# Patient Record
Sex: Female | Born: 1935
Health system: Southern US, Community
[De-identification: ages and names within clinical notes are randomized; demographics above are authoritative.]

## PROBLEM LIST (undated history)

## (undated) DIAGNOSIS — Z780 Asymptomatic menopausal state: Secondary | ICD-10-CM

## (undated) DIAGNOSIS — J8410 Pulmonary fibrosis, unspecified: Secondary | ICD-10-CM

## (undated) DIAGNOSIS — E079 Disorder of thyroid, unspecified: Secondary | ICD-10-CM

## (undated) DIAGNOSIS — D369 Benign neoplasm, unspecified site: Secondary | ICD-10-CM

## (undated) DIAGNOSIS — E039 Hypothyroidism, unspecified: Secondary | ICD-10-CM

## (undated) DIAGNOSIS — E7800 Pure hypercholesterolemia, unspecified: Secondary | ICD-10-CM

## (undated) DIAGNOSIS — K589 Irritable bowel syndrome without diarrhea: Secondary | ICD-10-CM

## (undated) DIAGNOSIS — M1990 Unspecified osteoarthritis, unspecified site: Secondary | ICD-10-CM

## (undated) HISTORY — DX: Unspecified osteoarthritis, unspecified site: M19.90

## (undated) HISTORY — DX: Pure hypercholesterolemia, unspecified: E78.00

## (undated) HISTORY — PX: COLONOSCOPY: SHX174

## (undated) HISTORY — PX: TONSILLECTOMY: SUR1361

## (undated) HISTORY — DX: Asymptomatic menopausal state: Z78.0

## (undated) HISTORY — DX: Irritable bowel syndrome, unspecified: K58.9

## (undated) HISTORY — PX: OTHER SURGICAL HISTORY: SHX169

## (undated) HISTORY — DX: Disorder of thyroid, unspecified: E07.9

## (undated) HISTORY — DX: Benign neoplasm, unspecified site: D36.9

---

## 1998-10-28 ENCOUNTER — Other Ambulatory Visit: Admission: RE | Admit: 1998-10-28 | Discharge: 1998-10-28 | Payer: Self-pay | Admitting: Family Medicine

## 1999-11-23 ENCOUNTER — Other Ambulatory Visit: Admission: RE | Admit: 1999-11-23 | Discharge: 1999-11-23 | Payer: Self-pay | Admitting: *Deleted

## 2000-11-23 ENCOUNTER — Other Ambulatory Visit: Admission: RE | Admit: 2000-11-23 | Discharge: 2000-11-23 | Payer: Self-pay | Admitting: Family Medicine

## 2003-12-10 ENCOUNTER — Other Ambulatory Visit: Admission: RE | Admit: 2003-12-10 | Discharge: 2003-12-10 | Payer: Self-pay | Admitting: Family Medicine

## 2004-12-17 ENCOUNTER — Ambulatory Visit: Payer: Self-pay | Admitting: Family Medicine

## 2005-05-18 ENCOUNTER — Ambulatory Visit: Payer: Self-pay | Admitting: Family Medicine

## 2005-06-03 ENCOUNTER — Ambulatory Visit: Payer: Self-pay | Admitting: Family Medicine

## 2005-12-21 ENCOUNTER — Encounter: Payer: Self-pay | Admitting: Family Medicine

## 2005-12-21 ENCOUNTER — Ambulatory Visit: Payer: Self-pay | Admitting: Family Medicine

## 2005-12-21 ENCOUNTER — Other Ambulatory Visit: Admission: RE | Admit: 2005-12-21 | Discharge: 2005-12-21 | Payer: Self-pay | Admitting: Family Medicine

## 2006-12-22 ENCOUNTER — Encounter: Payer: Self-pay | Admitting: Family Medicine

## 2006-12-22 ENCOUNTER — Ambulatory Visit: Payer: Self-pay | Admitting: Family Medicine

## 2006-12-22 LAB — CONVERTED CEMR LAB
ALT: 19 units/L (ref 0–35)
Basophils Relative: 1.5 % — ABNORMAL HIGH (ref 0.0–1.0)
Bilirubin, Direct: 0.1 mg/dL (ref 0.0–0.3)
CO2: 28 meq/L (ref 19–32)
Calcium: 9.2 mg/dL (ref 8.4–10.5)
Direct LDL: 139.6 mg/dL
Eosinophils Absolute: 0.1 10*3/uL (ref 0.0–0.6)
Eosinophils Relative: 1.9 % (ref 0.0–5.0)
GFR calc Af Amer: 79 mL/min
Glucose, Bld: 92 mg/dL (ref 70–99)
HCT: 38.1 % (ref 36.0–46.0)
Hemoglobin: 13.2 g/dL (ref 12.0–15.0)
Lymphocytes Relative: 29.3 % (ref 12.0–46.0)
MCV: 96 fL (ref 78.0–100.0)
Monocytes Absolute: 0.5 10*3/uL (ref 0.2–0.7)
Neutro Abs: 4.2 10*3/uL (ref 1.4–7.7)
Neutrophils Relative %: 59.5 % (ref 43.0–77.0)
Platelets: 289 10*3/uL (ref 150–400)
Sodium: 141 meq/L (ref 135–145)
Total Protein: 7.1 g/dL (ref 6.0–8.3)
VLDL: 43 mg/dL — ABNORMAL HIGH (ref 0–40)
WBC: 7 10*3/uL (ref 4.5–10.5)

## 2006-12-23 ENCOUNTER — Encounter: Payer: Self-pay | Admitting: Family Medicine

## 2006-12-23 LAB — CONVERTED CEMR LAB: Vit D, 1,25-Dihydroxy: 51 (ref 20–57)

## 2007-01-02 ENCOUNTER — Ambulatory Visit: Payer: Self-pay | Admitting: Internal Medicine

## 2007-01-06 ENCOUNTER — Ambulatory Visit: Payer: Self-pay | Admitting: Family Medicine

## 2007-01-13 ENCOUNTER — Encounter: Payer: Self-pay | Admitting: Family Medicine

## 2007-01-13 ENCOUNTER — Ambulatory Visit: Payer: Self-pay | Admitting: Internal Medicine

## 2007-01-13 ENCOUNTER — Encounter: Payer: Self-pay | Admitting: Internal Medicine

## 2007-01-13 DIAGNOSIS — D369 Benign neoplasm, unspecified site: Secondary | ICD-10-CM

## 2007-01-13 HISTORY — DX: Benign neoplasm, unspecified site: D36.9

## 2007-03-17 ENCOUNTER — Ambulatory Visit: Payer: Self-pay | Admitting: Family Medicine

## 2007-08-08 ENCOUNTER — Encounter: Payer: Self-pay | Admitting: Family Medicine

## 2007-09-26 ENCOUNTER — Encounter: Payer: Self-pay | Admitting: Family Medicine

## 2007-12-26 ENCOUNTER — Other Ambulatory Visit: Admission: RE | Admit: 2007-12-26 | Discharge: 2007-12-26 | Payer: Self-pay | Admitting: Family Medicine

## 2007-12-26 ENCOUNTER — Ambulatory Visit: Payer: Self-pay | Admitting: Family Medicine

## 2007-12-26 ENCOUNTER — Encounter: Payer: Self-pay | Admitting: Family Medicine

## 2007-12-26 DIAGNOSIS — M129 Arthropathy, unspecified: Secondary | ICD-10-CM | POA: Insufficient documentation

## 2007-12-26 DIAGNOSIS — E039 Hypothyroidism, unspecified: Secondary | ICD-10-CM

## 2007-12-26 DIAGNOSIS — D649 Anemia, unspecified: Secondary | ICD-10-CM

## 2007-12-26 DIAGNOSIS — T50995A Adverse effect of other drugs, medicaments and biological substances, initial encounter: Secondary | ICD-10-CM

## 2007-12-26 DIAGNOSIS — M949 Disorder of cartilage, unspecified: Secondary | ICD-10-CM

## 2007-12-26 DIAGNOSIS — M899 Disorder of bone, unspecified: Secondary | ICD-10-CM | POA: Insufficient documentation

## 2007-12-26 DIAGNOSIS — J309 Allergic rhinitis, unspecified: Secondary | ICD-10-CM | POA: Insufficient documentation

## 2007-12-26 DIAGNOSIS — E785 Hyperlipidemia, unspecified: Secondary | ICD-10-CM | POA: Insufficient documentation

## 2007-12-26 LAB — CONVERTED CEMR LAB
Blood in Urine, dipstick: NEGATIVE
Nitrite: NEGATIVE
Protein, U semiquant: NEGATIVE
Specific Gravity, Urine: 1.015
WBC Urine, dipstick: NEGATIVE

## 2008-01-01 LAB — CONVERTED CEMR LAB
ALT: 18 units/L (ref 0–35)
AST: 21 units/L (ref 0–37)
Albumin: 4 g/dL (ref 3.5–5.2)
BUN: 13 mg/dL (ref 6–23)
Basophils Relative: 1.2 % (ref 0.0–3.0)
CO2: 28 meq/L (ref 19–32)
Chloride: 99 meq/L (ref 96–112)
Creatinine, Ser: 0.9 mg/dL (ref 0.4–1.2)
Direct LDL: 129.6 mg/dL
Eosinophils Relative: 2.4 % (ref 0.0–5.0)
Glucose, Bld: 107 mg/dL — ABNORMAL HIGH (ref 70–99)
HDL: 43.7 mg/dL (ref >39.0)
Lymphocytes Relative: 25.5 % (ref 12.0–46.0)
Monocytes Relative: 7.5 % (ref 3.0–12.0)
Neutrophils Relative %: 63.4 % (ref 43.0–77.0)
RBC: 3.99 M/uL (ref 3.87–5.11)
Total Bilirubin: 1 mg/dL (ref 0.3–1.2)
VLDL: 46 mg/dL — ABNORMAL HIGH (ref 0–40)
WBC: 6.4 10*3/uL (ref 4.5–10.5)

## 2008-01-03 ENCOUNTER — Ambulatory Visit: Payer: Self-pay | Admitting: Family Medicine

## 2008-01-03 LAB — CONVERTED CEMR LAB: OCCULT 2: NEGATIVE

## 2008-09-04 ENCOUNTER — Encounter: Payer: Self-pay | Admitting: Family Medicine

## 2008-12-26 ENCOUNTER — Ambulatory Visit: Payer: Self-pay | Admitting: Family Medicine

## 2008-12-30 ENCOUNTER — Encounter: Payer: Self-pay | Admitting: Family Medicine

## 2009-01-03 ENCOUNTER — Ambulatory Visit: Payer: Self-pay | Admitting: Family Medicine

## 2009-01-03 LAB — CONVERTED CEMR LAB
OCCULT 2: NEGATIVE
OCCULT 3: NEGATIVE

## 2009-01-07 ENCOUNTER — Encounter: Payer: Self-pay | Admitting: Family Medicine

## 2009-01-21 ENCOUNTER — Telehealth: Payer: Self-pay | Admitting: Family Medicine

## 2009-01-21 ENCOUNTER — Encounter: Payer: Self-pay | Admitting: Family Medicine

## 2009-08-01 ENCOUNTER — Ambulatory Visit: Payer: Self-pay | Admitting: Family Medicine

## 2009-08-07 ENCOUNTER — Ambulatory Visit: Payer: Self-pay | Admitting: Family Medicine

## 2009-08-07 DIAGNOSIS — K589 Irritable bowel syndrome without diarrhea: Secondary | ICD-10-CM

## 2009-08-08 LAB — CONVERTED CEMR LAB
Basophils Absolute: 0 10*3/uL (ref 0.0–0.1)
CO2: 28 meq/L (ref 19–32)
Eosinophils Absolute: 0.2 10*3/uL (ref 0.0–0.7)
HCT: 35.7 % — ABNORMAL LOW (ref 36.0–46.0)
Lymphocytes Relative: 20.7 % (ref 12.0–46.0)
Lymphs Abs: 1.3 10*3/uL (ref 0.7–4.0)
MCHC: 33.6 g/dL (ref 30.0–36.0)
Monocytes Relative: 11.5 % (ref 3.0–12.0)
Platelets: 313 10*3/uL (ref 150.0–400.0)
RDW: 12.1 % (ref 11.5–14.6)
Sodium: 138 meq/L (ref 135–145)

## 2009-08-19 ENCOUNTER — Ambulatory Visit: Payer: Self-pay | Admitting: Family Medicine

## 2009-08-22 ENCOUNTER — Telehealth: Payer: Self-pay | Admitting: Internal Medicine

## 2009-08-28 ENCOUNTER — Ambulatory Visit: Payer: Self-pay | Admitting: Family Medicine

## 2009-08-28 LAB — CONVERTED CEMR LAB: OCCULT 1: NEGATIVE

## 2009-09-04 ENCOUNTER — Encounter: Payer: Self-pay | Admitting: Family Medicine

## 2009-09-05 ENCOUNTER — Encounter: Payer: Self-pay | Admitting: Family Medicine

## 2009-09-09 ENCOUNTER — Ambulatory Visit: Payer: Self-pay | Admitting: Internal Medicine

## 2009-10-09 LAB — CONVERTED CEMR LAB
Basophils Absolute: 0.1 10*3/uL (ref 0.0–0.1)
Basophils Relative: 0.9 % (ref 0.0–3.0)
Eosinophils Relative: 6.2 % — ABNORMAL HIGH (ref 0.0–5.0)
HCT: 33.8 % — ABNORMAL LOW (ref 36.0–46.0)
Hemoglobin: 11.8 g/dL — ABNORMAL LOW (ref 12.0–15.0)
Lymphocytes Relative: 31.6 % (ref 12.0–46.0)
Lymphs Abs: 2.5 10*3/uL (ref 0.7–4.0)
Monocytes Relative: 8.7 % (ref 3.0–12.0)
Neutro Abs: 4.2 10*3/uL (ref 1.4–7.7)
RBC: 3.56 M/uL — ABNORMAL LOW (ref 3.87–5.11)
RDW: 13.6 % (ref 11.5–14.6)

## 2009-10-16 ENCOUNTER — Ambulatory Visit: Payer: Self-pay | Admitting: Internal Medicine

## 2009-10-16 LAB — CONVERTED CEMR LAB
Basophils Absolute: 0.1 10*3/uL (ref 0.0–0.1)
Eosinophils Absolute: 0.2 10*3/uL (ref 0.0–0.7)
HCT: 35.3 % — ABNORMAL LOW (ref 36.0–46.0)
Hemoglobin: 12.3 g/dL (ref 12.0–15.0)
Lymphs Abs: 1.9 10*3/uL (ref 0.7–4.0)
MCHC: 34.8 g/dL (ref 30.0–36.0)
MCV: 95.1 fL (ref 78.0–100.0)
Monocytes Absolute: 0.5 10*3/uL (ref 0.1–1.0)
Neutro Abs: 2.8 10*3/uL (ref 1.4–7.7)
Platelets: 245 10*3/uL (ref 150.0–400.0)
RDW: 13.8 % (ref 11.5–14.6)

## 2010-01-07 ENCOUNTER — Ambulatory Visit: Payer: Self-pay | Admitting: Family Medicine

## 2010-01-07 ENCOUNTER — Other Ambulatory Visit: Admission: RE | Admit: 2010-01-07 | Discharge: 2010-01-07 | Payer: Self-pay | Admitting: Family Medicine

## 2010-01-07 DIAGNOSIS — D179 Benign lipomatous neoplasm, unspecified: Secondary | ICD-10-CM | POA: Insufficient documentation

## 2010-01-07 DIAGNOSIS — M1710 Unilateral primary osteoarthritis, unspecified knee: Secondary | ICD-10-CM | POA: Insufficient documentation

## 2010-01-07 DIAGNOSIS — E559 Vitamin D deficiency, unspecified: Secondary | ICD-10-CM | POA: Insufficient documentation

## 2010-01-07 DIAGNOSIS — IMO0002 Reserved for concepts with insufficient information to code with codable children: Secondary | ICD-10-CM

## 2010-01-14 ENCOUNTER — Ambulatory Visit: Payer: Self-pay | Admitting: Family Medicine

## 2010-01-20 ENCOUNTER — Encounter: Payer: Self-pay | Admitting: Family Medicine

## 2010-01-29 ENCOUNTER — Encounter: Payer: Self-pay | Admitting: Family Medicine

## 2010-06-07 HISTORY — PX: CATARACT EXTRACTION, BILATERAL: SHX1313

## 2010-07-05 LAB — CONVERTED CEMR LAB
ALT: 11 units/L (ref 0–35)
AST: 14 units/L (ref 0–37)
Albumin: 3.7 g/dL (ref 3.5–5.2)
BUN: 15 mg/dL (ref 6–23)
BUN: 16 mg/dL (ref 6–23)
Basophils Absolute: 0 10*3/uL (ref 0.0–0.1)
Bilirubin Urine: NEGATIVE
Bilirubin, Direct: 0.2 mg/dL (ref 0.0–0.3)
Blood in Urine, dipstick: NEGATIVE
CO2: 28 meq/L (ref 19–32)
Chloride: 100 meq/L (ref 96–112)
Chloride: 102 meq/L (ref 96–112)
Cholesterol: 220 mg/dL — ABNORMAL HIGH (ref 0–200)
Creatinine, Ser: 0.8 mg/dL (ref 0.4–1.2)
Direct LDL: 145.9 mg/dL
Direct LDL: 150 mg/dL
Eosinophils Absolute: 0.1 10*3/uL (ref 0.0–0.7)
Eosinophils Relative: 2.4 % (ref 0.0–5.0)
GFR calc non Af Amer: 70.95 mL/min (ref >60)
Glucose, Bld: 90 mg/dL (ref 70–99)
Glucose, Urine, Semiquant: NEGATIVE
HCT: 38.4 % (ref 36.0–46.0)
HDL: 44.4 mg/dL (ref >39.00)
Hemoglobin: 13.2 g/dL (ref 12.0–15.0)
Ketones, urine, test strip: NEGATIVE
Lymphs Abs: 1.5 10*3/uL (ref 0.7–4.0)
MCHC: 34.9 g/dL (ref 30.0–36.0)
MCV: 96.1 fL (ref 78.0–100.0)
MCV: 97.3 fL (ref 78.0–100.0)
Monocytes Absolute: 0.4 10*3/uL (ref 0.1–1.0)
Monocytes Absolute: 0.5 10*3/uL (ref 0.1–1.0)
Monocytes Relative: 8 % (ref 3.0–12.0)
Neutro Abs: 3.8 10*3/uL (ref 1.4–7.7)
Neutrophils Relative %: 55.1 % (ref 43.0–77.0)
Nitrite: NEGATIVE
Pap Smear: NORMAL
Platelets: 216 10*3/uL (ref 150.0–400.0)
Potassium: 4.2 meq/L (ref 3.5–5.1)
RDW: 12.6 % (ref 11.5–14.6)
Sodium: 138 meq/L (ref 135–145)
Specific Gravity, Urine: 1.02
TSH: 3.46 microintl units/mL (ref 0.35–5.50)
Total Bilirubin: 1.4 mg/dL — ABNORMAL HIGH (ref 0.3–1.2)
Total Protein: 7 g/dL (ref 6.0–8.3)
Urobilinogen, UA: 0.2
VLDL: 22 mg/dL (ref 0.0–40.0)
VLDL: 39 mg/dL (ref 0.0–40.0)
Vit D, 25-Hydroxy: 49 ng/mL (ref 30–89)
WBC Urine, dipstick: NEGATIVE
WBC: 5.1 10*3/uL (ref 4.5–10.5)
WBC: 6 10*3/uL (ref 4.5–10.5)
pH: 7

## 2010-07-07 NOTE — Assessment & Plan Note (Signed)
Summary: IBS, gastritis, dark stools/sheri    History of Present Illness Visit Type: consult  Primary GI MD: Stan Head MD [facility] Primary Provider: Judithann Sheen, MD  Requesting Provider: Judithann Sheen, MD  Chief Complaint: irritable bowel syndrome History of Present Illness:   77 yo woman with a diagnosis of IBS. Known to me from colonoscopy in 2008.  She was diagnosed with an infectious gastroenteritis and treated by Drs Clent Ridges and Scotty Court. She was quite unwell. Stools were very black in March, for about 4 days. She was also heme + on DRE 08/19/09.  She had not taken Pepto-Bismol when stools were black. She took Librarian, academic for 4 weeks also. She is also using hyoscyamne. She is much better with no melena or rectal bleeding and stools forming up. Not much if any abdominal pain at this time. All other GI ROS negative.             Current Medications (verified): 1)  Aspirin 81 Mg  Tabs (Aspirin) .... One Tablet By Mouth Once Daily 2)  Fish Oil 1000 Mg  Caps (Omega-3 Fatty Acids) .... Once Daily 3)  Prempro 0.625-2.5 Mg  Tabs (Conj Estrog-Medroxyprogest Ace) .Marland Kitchen.. 1 Qd 4)  Bone Density 300-200 Mg-Unit Tabs (Calcium-Vit D-Arg-Inos-Silicon) .... One Tablet By Mouth Once Daily 5)  Vitamin D 1000 Unit  Tabs (Cholecalciferol) .... Once Daily 6)  Vesicare 5 Mg Tabs (Solifenacin Succinate) .Marland Kitchen.. 1 By Mouth Once Daily 7)  Hyoscyamine Sulfate Cr 0.375 Mg Xr12h-Tab (Hyoscyamine Sulfate) .Marland Kitchen.. 1 Stat Then 1 Am and Pm For Irritable Bowel and Coltis 8)  Vitamin C 500 Mg  Tabs (Ascorbic Acid) .... One Tablet By Mouth Once Daily  Allergies (verified): No Known Drug Allergies  Past History:  Past Medical History: Hyperthyroidism MENOPAUSE 1965 IBS (episodic throughout the years)  Past Surgical History: Tonsillectomy  Family History: father deceased age 55--MI   mother deceased--age 31-- alzheimers sister deceased age 4--MI brother deceased 70--kidney failure brother living    No FH of Colon Cancer:  Social History: Reviewed history from 12/26/2007 and no changes required. 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  Never Smoked  Colonoscopy  Procedure date:  01/13/2007  Findings:      Diminutive adenoma Left-sided diverticulosis Hemorrhoids  Comments:      Repeat colonoscopy in 5 years.   Procedures Next Due Date:    Colonoscopy: 01/2012   Review of Systems       somewhat weak but regaining strength no fever  Vital Signs:  Patient profile:   77 year old female Height:      66 inches Weight:      144 pounds BMI:     23.33 BSA:     1.74 Pulse rate:   98 / minute Pulse rhythm:   regular BP sitting:   120 / 76 Cuff size:   regular  Vitals Entered By: Ok Anis CMA (September 09, 2009 2:50 PM)  Physical Exam  General:  Well developed, well nourished, no acute distress. Eyes:  anicteric Lungs:  Clear anterior Heart:  Regular rate and rhythm; no murmurs, rubs,  or bruits. Abdomen:  soft and nontender without tenderness, masses, BS+ Extremities:  no edema Neurologic:  Alert and  oriented x3 Psych:  Alert and cooperative. Normal mood and affect.   Impression & Recommendations:  Problem # 1:  FECAL OCCULT BLOOD (ICD-792.1) New to me: Had transient possible elena and some rectal bleeding so FOBT + not a surprise.  This appears resolved and related to an infectious syndrome. Had colonoscopy in 2008 so unless she is anemic now or has recurrence, am not inclined to evaluate further at this time. Orders: TLB-CBC Platelet - w/Differential (85025-CBCD)  Problem # 2:  GASTROENTERITIS, VIRAL (ICD-008.8) Assessment: Improved This seems resolved and some mild lingering symptoms likely secondary to IBS. IBS recovery from gastroenteritis usually longer than for most.  Problem # 3:  IRRITABLE BOWEL SYNDROME (ICD-564.1) Assessment: Comment Only continue hyoscyamine  Patient Instructions: 1)  Please go to the basement to have  your lab tests drawn today. 2)  A CBC was ordered. 3)  Please schedule a follow-up appointment as needed.  4)  Copy sent to : Cecille Rubin, MD 5)  The medication list was reviewed and reconciled.  All changed / newly prescribed medications were explained.  A complete medication list was provided to the patient / caregiver.  Appended Document: IBS, gastritis, dark stools/sheri reviewed

## 2010-07-07 NOTE — Letter (Signed)
Summary: Generic Letter  [place] at [facility]  [address] [place], [place] [address]   Phone: [phone]  Fax: [phone]    09/04/2009  [facility] [address] [place], [place]  [address]  Dear Ms. Iseminger,   Hemocult cards were all negative.         Sincerely,   DR Gwenyth Bender VHQIONGE

## 2010-07-07 NOTE — Assessment & Plan Note (Signed)
Summary: PT CONCERNED ABOUT INTERNAL BLEEDING // RS   Vital Signs:  Patient profile:   77 year old female Weight:      148 pounds O2 Sat:      99 % Temp:     98 degrees F Pulse rate:   94 / minute BP sitting:   124 / 74  (left arm) Cuff size:   regular  Vitals Entered By: Pura Spice, RN (August 07, 2009 10:42 AM) CC: saw Dr Clent Ridges last Friday for Gastroenteritis was tx with BRAT diet Promethazine and Immodium which diarrhea resolved but now c/o passing mucus orange in color  Is Patient Diabetic? No   History of Present Illness: This 77 year old white female was seen by Dr. Daisey Must one week ago with nausea vomiting and diarrheal and these problems have subsided however the patient is now in passing a considerable amount of mucus which he states is orange colored although with her large colored stool. She took Imodium which helped some but problem persists she is not taking any medication at this time The patient relates she feels she does have mucus with her stools and occasionally some bright red small amount of blood with the stool at the end of the bowel movement. 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  Never Smoked  Risk Factors: Smoking Status: never (12/26/2007)  Review of Systems  The patient denies anorexia, fever, weight loss, weight gain, vision loss, decreased hearing, hoarseness, chest pain, syncope, dyspnea on exertion,  peripheral edema, prolonged cough, headaches, hemoptysis, abdominal pain, melena, hematochezia, severe indigestion/heartburn, hematuria, incontinence, genital sores, muscle weakness, suspicious skin lesions, transient blindness, difficulty walking, depression, unusual weight change, abnormal bleeding, enlarged lymph nodes, angioedema, breast masses, and testicular masses.    Physical Exam  General:  Well-developed,well-nourished,in no acute distress; alert,appropriate and cooperative throughout examination Lungs:  Normal respiratory effort, chest expands symmetrically. Lungs are clear to auscultation, no crackles or wheezes. Heart:  Normal rate and regular rhythm. S1 and S2 normal without gallop, murmur, click, rub or other extra sounds. Abdomen:  increased bowel sounds, minimal tenderness over the lower abdomen especially over the descending colon no mass is palpableno hepatomegaly and no splenomegaly.   Rectal:  negative rectal exam with negative Hemoccult   Impression & Recommendations:  Problem # 1:  GASTROENTERITIS, VIRAL (ICD-008.8) Assessment Improved  Orders: TLB-CBC Platelet - w/Differential (85025-CBCD) TLB-Electrolyte Panel (NA/K/CL/CO2) (80051-LYTES)  Problem # 2:  IRRITABLE BOWEL SYNDROME (ICD-564.1) Assessment: New  Orders: Prescription Created Electronically [phone])  Problem # 3:  ARTHRITIS (ICD-716.90) Assessment: Unchanged  Problem # 4:  ALLERGIC RHINITIS, CHRONIC (ICD-477.9) Assessment: Unchanged  Her updated medication list for this problem includes:    Promethazine Hcl 25 Mg Tabs (Promethazine hcl) .Marland Kitchen... 1 q 4 hours as needed nausea  Complete Medication List: 1)  Multivitamins Tabs (Multiple vitamin) .... Once daily 2)  Adprin B 325 Mg Tabs (Aspirin buf(cacarb-mgcarb-mgo)) .... Once daily 3)  Fish Oil 1000 Mg Caps (Omega-3 fatty acids) .... Once daily 4)  Prempro  0.625-2.5 Mg Tabs (Conj estrog-medroxyprogest ace) .Marland Kitchen.. 1 qd 5)  Bone Maximer  6)  Vitamin D  1000 Unit Tabs (Cholecalciferol) .... Once daily 7)  Vesicare 5 Mg Tabs (Solifenacin succinate) .Marland Kitchen.. 1 by mouth once daily 8)  Promethazine Hcl 25 Mg Tabs (Promethazine hcl) .Marland Kitchen.. 1 q 4 hours as needed nausea 9)  Hyoscyamine Sulfate Cr 0.375 Mg Xr12h-tab (Hyoscyamine sulfate) .Marland Kitchen.. 1 stat then 1 am and pm for irritable bowel and colotis  Patient Instructions: 1)  viral [id number] by irritable bowel, hypewractivity with mucous colitisw 2)  To check CBC, and electrolytes 3)  BUY ONE MONTh OF align 4)  hYOSCYAMINE .375 tabs AM and PM 5)  gradually increase diet but keep good fluid intake Prescriptions: HYOSCYAMINE SULFATE CR 0.375 MG XR12H-TAB (HYOSCYAMINE SULFATE) 1 stat then 1 AM and PM for irritable bowel and colotis  #36 x 5   Entered and Authorized by:   Judithann Sheen MD   Signed by:   Judithann Sheen MD on 08/07/2009   Method used:   Electron[facility] to        [id number]       [address]       [place], [place]  [address]       Ph: [phone] or [phone]       Fax: [phone]   RxID:   [id number]

## 2010-07-07 NOTE — Progress Notes (Signed)
Summary: Apt asap Abd Pain & IBS   Phone Note From Other Clinic Call back at Home Phone [phone]   Caller: Aurther Loft @ Dr Scotty Court Call For: Dr Leone Payor Reason for Call: Schedule Patient Appt Summary of Call: Req appt sooner than next available on 09-25-09. Problem with irritable bowel,frequent bowel movements after episode of gastritis. Black stools, low abdominal pain, occasional upper abd pain.  Had a colon back in 2003 with Dr Leone Payor. Would like pt seen asap- ok to call pt back directly with appt Initial call taken by: Leanor Kail [facility],  August 22, 2009 9:04 AM  Follow-up for Phone Call        rev scheduled with patient for 09-09-09 2:30 Follow-up by: Darcey Nora RN, CGRN,  August 22, 2009 9:14 AM

## 2010-07-07 NOTE — Letter (Signed)
Summary: Generic Letter  [place] at [facility]  [address] [place], [place] [address]   Phone: [phone]  Fax: [phone]    01/29/2010  [facility] [address] [place], [place]  [address]  Dear Ms. Christoph,     HEMOCCULT CARDS WERE ALL NEGATIVE.      Sincerely,   DR Gwenyth Bender STAFFORD,MD

## 2010-07-07 NOTE — Assessment & Plan Note (Signed)
Summary: ? VIRUS-VOMITING,DIARREA SINCE WEDNESDAY//CCM   Vital Signs:  Patient profile:   77 year old female Weight:      148 pounds Temp:     98.3 degrees F oral BP sitting:   108 / 64  (left arm) Cuff size:   regular  Vitals Entered By: Alfred Levins, CMA (August 01, 2009 2:59 PM) CC: diarrhea, n/v x2 days, chills   History of Present Illness: here for 3 days of nausea and vomitting with some diarrhea. She has some mild diffuse abdominal cramps. No fever.   Allergies (verified): No Known Drug Allergies  Past History:  Past Medical History: Reviewed history from 12/22/2006 and no changes required. Hyperthyroidism MENOPAUSE 1965  Past Surgical History: Reviewed history from 12/22/2006 and no changes required. Denies surgical history  Review of Systems  The patient denies anorexia, fever, weight loss, weight gain, vision loss, decreased hearing, hoarseness, chest pain, syncope, dyspnea on exertion, peripheral edema, prolonged cough, headaches, hemoptysis, melena, hematochezia, severe indigestion/heartburn, hematuria, incontinence, genital sores, muscle weakness, suspicious skin lesions, transient blindness, difficulty walking, depression, unusual weight change, abnormal bleeding, enlarged lymph nodes, angioedema, breast masses, and testicular masses.    Physical Exam  General:  Well-developed,well-nourished,in no acute distress; alert,appropriate and cooperative throughout examination Neck:  No deformities, masses, or tenderness noted. Lungs:  Normal respiratory effort, chest expands symmetrically. Lungs are clear to auscultation, no crackles or wheezes. Heart:  Normal rate and regular rhythm. S1 and S2 normal without gallop, murmur, click, rub or other extra sounds. Abdomen:  soft, normal bowel sounds, no distention, no masses, no guarding, no rigidity, no rebound tenderness, no abdominal hernia, no hepatomegaly, and no splenomegaly.  Mildly tende diffusely   Impression  & Recommendations:  Problem # 1:  GASTROENTERITIS, VIRAL (ICD-008.8)  Complete Medication List: 1)  Multivitamins Tabs (Multiple vitamin) .... Once daily 2)  Adprin B 325 Mg Tabs (Aspirin buf(cacarb-mgcarb-mgo)) .... Once daily 3)  Fish Oil 1000 Mg Caps (Omega-3 fatty acids) .... Once daily 4)  Prempro 0.625-2.5 Mg Tabs (Conj estrog-medroxyprogest ace) .Marland Kitchen.. 1 qd 5)  Bone Maximer  6)  Vitamin D 1000 Unit Tabs (Cholecalciferol) .... Once daily 7)  Vesicare 5 Mg Tabs (Solifenacin succinate) .Marland Kitchen.. 1 by mouth once daily 8)  Promethazine Hcl 25 Mg Tabs (Promethazine hcl) .Marland Kitchen.. 1 q 4 hours as needed nausea  Patient Instructions: 1)  rest, BRAT diet, drink fluids.  2)  Please schedule a follow-up appointment as needed .  Prescriptions: PROMETHAZINE HCL 25 MG TABS (PROMETHAZINE HCL) 1 q 4 hours as needed nausea  #30 x 2   Entered and Authorized by:   Nelwyn Salisbury MD   Signed by:   Nelwyn Salisbury MD on 08/01/2009   Method used:   [facility]        [facility]  [id number]       [address]       [place], [place]  [address]       Ph: [phone] or [phone]       Fax: [phone]   RxID:   [id number]

## 2010-07-07 NOTE — Assessment & Plan Note (Signed)
Summary: emp---will fast//ccm   Vital Signs:  Patient profile:   77 year old female Height:      66 inches Weight:      142 pounds BMI:     23.00 O2 Sat:      97 % Temp:     98.4 degrees F Pulse rate:   97 / minute Pulse rhythm:   regular BP sitting:   140 / 72  (left arm)  Vitals Entered By: Pura Spice, RN (January 07, 2010 10:11 AM) CC: go over problems pap needed today fasting for labs. refills    History of Present Illness: This 77 year old white married female is in today to discuss her medical problems as well as get necessary refills of her medicines and she came in fasting in case we need to order blood studies She complains of pain in the left knee swollen at times tender and painful at times warm She complains of pain in the right TMJ joint it does bother her for the past 4-6 weeks ago on questioning found it was at occurred after he went to the dentist for a prolonged dental procedure in which she strained her jaw She also would like me to check a lump on her left forearm which is nonpainful Review of systems essentially negative  EKG  Procedure date:  01/07/2010  Findings:      sinus rhythm 74 poor R wave progrssion - probable normal variant    Allergies: No Known Drug Allergies  Past History:  Past Medical History: Last updated: 09/09/2009 Hyperthyroidism MENOPAUSE 1965 IBS (episodic throughout the years)  Past Surgical History: Last updated: 09/09/2009 Tonsillectomy  Social History: Last updated: 12/26/2007 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  Never Smoked  Risk Factors: Smoking Status: never (12/26/2007)  Review of Systems      See HPI  The patient denies anorexia, fever, weight loss, weight gain, vision loss, decreased hearing, hoarseness, chest pain, syncope, dyspnea on exertion, peripheral edema, prolonged cough, headaches, hemoptysis, abdominal pain, melena, hematochezia, severe indigestion/heartburn, hematuria,  incontinence, genital sores, muscle weakness, suspicious skin lesions, transient blindness, difficulty walking, depression, unusual weight change, abnormal bleeding, enlarged lymph nodes, angioedema, breast masses, and testicular masses.    Physical Exam  General:  Well-developed,well-nourished,in no acute distress; alert,appropriate and cooperative throughout examination Head:  Normocephalic and atraumatic without obvious abnormalities. No apparent alopecia or balding. Eyes:  No corneal or conjunctival inflammation noted. EOMI. Perrla. Funduscopic exam benign, without hemorrhages, exudates or papilledema. Vision grossly normal. Ears:  External ear exam shows no significant lesions or deformities.  Otoscopic examination reveals clear canals, tympanic membranes are intact bilaterally without bulging, retraction, inflammation or discharge. Hearing is grossly normal bilaterally. Nose:  External nasal examination shows no deformity or inflammation. Nasal mucosa are pink and moist without lesions or exudates. Mouth:  marked tenderness over the right TMJ joint left TMJ negative Mouth exam otherwise negative Neck:  No deformities, masses, or tenderness noted. Chest Wall:  No deformities, masses, or tenderness noted. Breasts:  No mass, nodules, thickening, tenderness, bulging, retraction, inflamation, nipple discharge or skin changes noted.   Lungs:  Normal respiratory effort, chest expands symmetrically. Lungs are clear to auscultation, no crackles or wheezes. Heart:  Normal rate and regular rhythm. S1 and S2 normal without gallop, murmur, click, rub or other extra sounds. Abdomen:  Bowel sounds positive,abdomen soft and non-tender without masses, organomegaly or hernias noted. Rectal:  No external abnormalities noted. Normal sphincter tone. No rectal masses or  tenderness. Genitalia:  Normal introitus for age, no external lesions, no vaginal discharge, mucosa pink and moist, no vaginal or cervical  lesions, no vaginal atrophy, no friaility or hemorrhage, normal uterus size and position, no adnexal masses or tenderness Msk:  soft throughout 3 cm lump on the left forearm, nontender medial and lateral tenderness of the left knee minimal swelling nonerythematous Pulses:  R and L carotid,radial,femoral,dorsalis pedis and posterior tibial pulses are full and equal bilaterally Extremities:  No clubbing, cyanosis, edema, or deformity noted with normal full range of motion of all joints.   Neurologic:  No cranial nerve deficits noted. Station and gait are normal. Plantar reflexes are down-going bilaterally. DTRs are symmetrical throughout. Sensory, motor and coordinative functions appear intact. Skin:  Intact without suspicious lesions or rashes Cervical Nodes:  No lymphadenopathy noted Axillary Nodes:  No palpable lymphadenopathy Inguinal Nodes:  No significant adenopathy Psych:  Cognition and judgment appear intact. Alert and cooperative with normal attention span and concentration. No apparent delusions, illusions, hallucinations   Impression & Recommendations:  Problem # 1:  LIPOMA (ICD-214.9) Assessment New 3 left 3 cm left forearm  Problem # 2:  ARTHRITIS, LEFT KNEE (ZOX-096.04) Assessment: New  diclofenac 75 mg b.i.d.  Orders: Prescription Created Electronically [phone])  Problem # 3:  IRRITABLE BOWEL SYNDROME (ICD-564.1) Assessment: Improved  Problem # 4:  HORMONE REPLACEMENT THERAPY (ICD-V07.4) Assessment: Improved decrease Prempro from 0.625  to 0.425  Problem # 5:  HYPERLIPIDEMIA (ICD-272.4) Assessment: Unchanged  Orders: EKG w/ Interpretation (93000) Specimen Handling (11914) TLB-Lipid Panel (80061-LIPID) TLB-Hepatic/Liver Function Pnl (80076-HEPATIC)  Problem # 6:  Screening Cervical Cancer (ICD-V76.2) Assessment: Unchanged  Complete Medication List: 1)  Aspirin 81 Mg Tabs (Aspirin) .... One tablet by mouth once daily 2)  Fish Oil 1000 Mg Caps (Omega-3 fatty  acids) .... Once daily 3)  Bone Density 300-200 Mg-unit Tabs (Calcium-vit d-arg-inos-silicon) .... One tablet by mouth once daily 4)  Vitamin D 1000 Unit Tabs (Cholecalciferol) .... Once daily 5)  Vitamin C 500 Mg Tabs (Ascorbic acid) .... One tablet by mouth once daily 6)  Prempro 0.45-1.5 Mg Tabs (Conj estrog-medroxyprogest ace) .Marland Kitchen.. 1 qd 7)  Diclofenac Sodium 75 Mg Tbec (Diclofenac sodium) .Marland Kitchen.. 1 two times a day pc for arthritis  Other Orders: UA Dipstick w/o Micro (automated)  (81003) Venipuncture (78295) T-Vitamin D (25-Hydroxy) (62130-86578) TLB-BMP (Basic Metabolic Panel-BMET) (80048-METABOL) TLB-CBC Platelet - w/Differential (85025-CBCD) TLB-TSH (Thyroid Stimulating Hormone) (84443-TSH)  Patient Instructions: 1)  Feeley Jewett doing for well 2)  Prescribed diclofenac 75 mg b.i.d. we'll arthritis of the knee 3)  The lipoma left forearm does not need any treatment at this time if it becomes painful then we will arrange for removal 4)  Decreased her present Prempro 20.4-5 Prescriptions: DICLOFENAC SODIUM 75 MG TBEC (DICLOFENAC SODIUM) 1 two times a day PC FOR ARTHRITIS  #60 x 11   Entered and Authorized by:   Judithann Sheen MD   Signed by:   Judithann Sheen MD on 01/07/2010   Method used:   [facility]  [id number]       [address]       [place], [place]  [address]       Ph: [phone] or [phone]       Fax: [phone]   RxID:   [id number] PREMPRO 0.45-1.5 MG TABS (CONJ ESTROG-MEDROXYPROGEST ACE) 1 QD  #28 x  11   Entered and Authorized by:   Judithann Sheen MD   Signed by:   Judithann Sheen MD on 01/07/2010   Method used:   [facility]  [id number]       [address]       [place], [place]  [address]       Ph: [phone] or [phone]       Fax: [phone]   RxID:   [id number]   Laboratory Results   Urine  Tests  Date/Time Recieved: January 07, 2010 12:49 PM  Date/Time Reported: January 07, 2010 12:49 PM   Routine Urinalysis   Color: yellow Appearance: Clear Glucose: negative   (Normal Range: Negative) Bilirubin: negative   (Normal Range: Negative) Ketone: negative   (Normal Range: Negative) Spec. Gravity: 1.020   (Normal Range: 1.003-1.035) Blood: negative   (Normal Range: Negative) pH: 7.0   (Normal Range: 5.0-8.0) Protein: negative   (Normal Range: Negative) Urobilinogen: 0.2   (Normal Range: 0-1) Nitrite: negative   (Normal Range: Negative) Leukocyte Esterace: negative   (Normal Range: Negative)    Comments: Wynona Canes, CMA  January 07, 2010 12:49 PM

## 2010-07-07 NOTE — Assessment & Plan Note (Signed)
Summary: continued rectal bleeding with weakness, and epigastric pain/dm   Vital Signs:  Patient profile:   76 year old female Weight:      147 pounds O2 Sat:      97 % Temp:     98.5 degrees F Pulse rate:   105 / minute BP sitting:   120 / 82  Vitals Entered By: Pura Spice, RN (August 19, 2009 11:37 AM) CC: states BM ben normal lighter until Sunday then becoming darker. feels tired. dietary modifications has been increase sodium intake and drinking OJ    History of Present Illness: This 77 year old white married female has had a gastrointestinal problem over the past 3 weeks. She was initially seen by Dr. Clent Ridges and treated for gastroenteritis he was then seen in May complaining of greenish frequent stools and was treated with house, and, a line and related to 3 days later that she was improving however on this she began having black stools opinion have a bowel movement pain and general mallaise. Since the last visit she began having epigastric pain prior to and after meals as well as continue to have lower abdominal pain.  Allergies (verified): No Known Drug Allergies  Past History:  Past Medical History: Last updated: 12/22/2006 Hyperthyroidism MENOPAUSE 1965  Past Surgical History: Last updated: 12/22/2006 Denies surgical history  Social History: Last updated: 12/26/2007 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  Never Smoked  Risk Factors: Smoking Status: never (12/26/2007)  Review of Systems      See HPI General:  See HPI; Complains of malaise. ENT:  Denies decreased hearing, difficulty swallowing, ear discharge, earache, hoarseness, nasal congestion, nosebleeds, postnasal drainage, ringing in ears, sinus pressure, and sore throat. CV:  Denies bluish discoloration of lips or nails, chest pain or discomfort, difficulty breathing at night, difficulty breathing while lying down, fainting, fatigue, leg cramps with exertion, lightheadness, near  fainting, palpitations, shortness of breath with exertion, swelling of feet, swelling of hands, and weight gain. Resp:  Denies chest discomfort, chest pain with inspiration, cough, coughing up blood, excessive snoring, hypersomnolence, morning headaches, pleuritic, shortness of breath, sputum productive, and wheezing. GI:  Complains of abdominal pain and indigestion; black stools. GU:  Denies abnormal vaginal bleeding, decreased libido, discharge, dysuria, genital sores, hematuria, incontinence, nocturia, urinary frequency, and urinary hesitancy. MS:  Denies joint pain, joint redness, joint swelling, loss of strength, low back pain, mid back pain, muscle aches, muscle , cramps, muscle weakness, stiffness, and thoracic pain.  Physical Exam  General:  Well-developed,well-nourished,in no acute distress; alert,appropriate and cooperative throughout examination Lungs:  Normal respiratory effort, chest expands symmetrically. Lungs are clear to auscultation, no crackles or wheezes. Heart:  Normal rate and regular rhythm. S1 and S2 normal without gallop, murmur, click, rub or other extra sounds. Abdomen:  epigastric tenderness as well as generalized lower abdominal tenderness possibly more in the right lower abdomen than the left Rectal:  black stool on examination, Hemoccult positive   Impression & Recommendations:  Problem # 1:  ABDOMINAL PAIN, GENERALIZED (ICD-789.07) Assessment Deteriorated  Problem # 2:  ABDOMINAL PAIN, EPIGASTRIC (ICD-789.06) Assessment: New  Problem # 3:  FECAL OCCULT BLOOD (ICD-792.1) Assessment: New  Orders: Gastroenterology Referral (GI) Hgb (04540)  Problem # 4:  IRRITABLE BOWEL SYNDROME (ICD-564.1) Assessment: Unchanged  Complete Medication List: 1)  Multivitamins Tabs (Multiple vitamin) .... Once daily 2)  Adprin B 325 Mg Tabs (Aspirin buf(cacarb-mgcarb-mgo)) .... Once daily 3)  Fish Oil 1000 Mg Caps (Omega-3 fatty acids) .Marland KitchenMarland KitchenMarland Kitchen  Once daily 4)  Prempro  0.625-2.5 Mg Tabs (Conj estrog-medroxyprogest ace) .Marland Kitchen.. 1 qd 5)  Bone Maximer  6)  Vitamin D 1000 Unit Tabs (Cholecalciferol) .... Once daily 7)  Vesicare 5 Mg Tabs (Solifenacin succinate) .Marland Kitchen.. 1 by mouth once daily 8)  Promethazine Hcl 25 Mg Tabs (Promethazine hcl) .Marland Kitchen.. 1 q 4 hours as needed nausea 9)  Hyoscyamine Sulfate Cr 0.375 Mg Xr12h-tab (Hyoscyamine sulfate) .Marland Kitchen.. 1 stat then 1 am and pm for irritable bowel and colotis  Patient Instructions: 1)  plan for referral to Dr. Nile Dear previously for colonoscopic examination in 2003. He complained of epigastric pain as well as continued lower abdominal pain and with the new finding of black stools and Hemoccult-positive stool will arrange for a pulmonary that earlier this possible date. 2)  hemoglobin surprisingly 13 g, lost 12 g on previous Visit*1   Laboratory Results   CBC   HGB:  13.0 g/dL   (Normal Range: 24.4-01.0 in Males, 12.0-15.0 in Females) CommentsRita Ohara  August 19, 2009 1:00 PM

## 2010-07-07 NOTE — Letter (Signed)
Summary: Results Follow-up Letter  [facility]  [address] [place], [place] [address]   Phone: [phone]  Fax: [phone]    01/20/2010  [address] [place], [place]  [address]  Dear Ms. Russomanno,   The following are the results of your recent test(s):  Test     Result     Pap Smear    Normal___yes____      Sincerely,    Dr Gwenyth Bender Stafford,MD   at [facility]

## 2010-09-03 ENCOUNTER — Encounter: Payer: Self-pay | Admitting: Family Medicine

## 2010-09-17 ENCOUNTER — Encounter: Payer: Self-pay | Admitting: Family Medicine

## 2010-12-19 ENCOUNTER — Other Ambulatory Visit: Payer: Self-pay | Admitting: Family Medicine

## 2011-01-12 ENCOUNTER — Other Ambulatory Visit (HOSPITAL_COMMUNITY)
Admission: RE | Admit: 2011-01-12 | Discharge: 2011-01-12 | Disposition: A | Discharge status: Home / Self Care | Payer: Medicare Other | Source: Ambulatory Visit | Attending: Family Medicine | Admitting: Family Medicine

## 2011-01-12 ENCOUNTER — Ambulatory Visit (INDEPENDENT_AMBULATORY_CARE_PROVIDER_SITE_OTHER): Payer: Medicare Other | Admitting: Family Medicine

## 2011-01-12 ENCOUNTER — Encounter: Payer: Self-pay | Admitting: Family Medicine

## 2011-01-12 VITALS — BP 122/78 | HR 95 | Temp 98.4°F | Ht 66.5 in | Wt 143.0 lb

## 2011-01-12 DIAGNOSIS — Z124 Encounter for screening for malignant neoplasm of cervix: Secondary | ICD-10-CM | POA: Insufficient documentation

## 2011-01-12 DIAGNOSIS — E559 Vitamin D deficiency, unspecified: Secondary | ICD-10-CM

## 2011-01-12 DIAGNOSIS — D649 Anemia, unspecified: Secondary | ICD-10-CM

## 2011-01-12 DIAGNOSIS — E785 Hyperlipidemia, unspecified: Secondary | ICD-10-CM

## 2011-01-12 DIAGNOSIS — Z0000 Encounter for general adult medical examination without abnormal findings: Secondary | ICD-10-CM

## 2011-01-12 DIAGNOSIS — R3915 Urgency of urination: Secondary | ICD-10-CM

## 2011-01-12 DIAGNOSIS — M129 Arthropathy, unspecified: Secondary | ICD-10-CM

## 2011-01-12 DIAGNOSIS — M1990 Unspecified osteoarthritis, unspecified site: Secondary | ICD-10-CM

## 2011-01-12 DIAGNOSIS — E039 Hypothyroidism, unspecified: Secondary | ICD-10-CM

## 2011-01-12 DIAGNOSIS — J309 Allergic rhinitis, unspecified: Secondary | ICD-10-CM

## 2011-01-12 LAB — POCT URINALYSIS DIPSTICK
Bilirubin, UA: NEGATIVE
Glucose, UA: NEGATIVE
Ketones, UA: NEGATIVE
Nitrite, UA: POSITIVE
Protein, UA: NEGATIVE
Spec Grav, UA: 1.02
Urobilinogen, UA: 0.2
pH, UA: 5.5

## 2011-01-12 LAB — BASIC METABOLIC PANEL WITH GFR
BUN: 15 mg/dL (ref 6–23)
CO2: 28 meq/L (ref 19–32)
Calcium: 9.2 mg/dL (ref 8.4–10.5)
Chloride: 105 meq/L (ref 96–112)
Creatinine, Ser: 0.9 mg/dL (ref 0.4–1.2)
GFR: 67.92 mL/min
Glucose, Bld: 101 mg/dL — ABNORMAL HIGH (ref 70–99)
Potassium: 3.9 meq/L (ref 3.5–5.1)
Sodium: 144 meq/L (ref 135–145)

## 2011-01-12 LAB — CBC WITH DIFFERENTIAL/PLATELET
Basophils Absolute: 0 K/uL (ref 0.0–0.1)
Basophils Relative: 0.7 % (ref 0.0–3.0)
Eosinophils Absolute: 0.2 K/uL (ref 0.0–0.7)
Eosinophils Relative: 2.4 % (ref 0.0–5.0)
HCT: 39.1 % (ref 36.0–46.0)
Hemoglobin: 13.1 g/dL (ref 12.0–15.0)
Lymphocytes Relative: 23.8 % (ref 12.0–46.0)
Lymphs Abs: 1.6 K/uL (ref 0.7–4.0)
MCHC: 33.6 g/dL (ref 30.0–36.0)
MCV: 96.7 fl (ref 78.0–100.0)
Monocytes Absolute: 0.5 K/uL (ref 0.1–1.0)
Monocytes Relative: 8.1 % (ref 3.0–12.0)
Neutro Abs: 4.4 K/uL (ref 1.4–7.7)
Neutrophils Relative %: 65 % (ref 43.0–77.0)
Platelets: 238 K/uL (ref 150.0–400.0)
RBC: 4.04 Mil/uL (ref 3.87–5.11)
RDW: 13.3 % (ref 11.5–14.6)
WBC: 6.8 K/uL (ref 4.5–10.5)

## 2011-01-12 LAB — HEPATIC FUNCTION PANEL
ALT: 12 U/L (ref 0–35)
AST: 16 U/L (ref 0–37)
Albumin: 4 g/dL (ref 3.5–5.2)
Alkaline Phosphatase: 39 U/L (ref 39–117)
Bilirubin, Direct: 0.1 mg/dL (ref 0.0–0.3)
Total Protein: 7.7 g/dL (ref 6.0–8.3)

## 2011-01-12 LAB — LIPID PANEL
Cholesterol: 232 mg/dL — ABNORMAL HIGH (ref 0–200)
HDL: 49.1 mg/dL
Total CHOL/HDL Ratio: 5
Triglycerides: 159 mg/dL — ABNORMAL HIGH (ref 0.0–149.0)
VLDL: 31.8 mg/dL (ref 0.0–40.0)

## 2011-01-12 LAB — TSH: TSH: 3.57 u[IU]/mL (ref 0.35–5.50)

## 2011-01-12 MED ORDER — DICLOFENAC SODIUM 75 MG PO TBEC: 75.0000 mg | DELAYED_RELEASE_TABLET | Freq: Two times a day (BID) | ORAL | Status: DC

## 2011-01-12 MED ORDER — SOLIFENACIN SUCCINATE 5 MG PO TABS: 5.0000 mg | ORAL_TABLET | Freq: Every day | ORAL | Status: DC

## 2011-01-12 NOTE — Patient Instructions (Addendum)
Were doing very well physically  no abnormalities other than a little nonhealing place on the top fair-haired as well as starting diclofenac 75 mg twice a day for arthritis of the hip Start taking Prempro every other day until gone then stop Take vesicare 5mg  qd for urinary urgency, if dry mouh take 1/2 tab  Allegra tab twice daily for allergic rhinitis Will call lab results

## 2011-01-14 ENCOUNTER — Telehealth: Payer: Self-pay

## 2011-01-14 DIAGNOSIS — N390 Urinary tract infection, site not specified: Secondary | ICD-10-CM

## 2011-01-14 MED ORDER — CIPROFLOXACIN HCL 500 MG PO TABS: 500.0000 mg | ORAL_TABLET | Freq: Two times a day (BID) | ORAL | Status: AC

## 2011-01-14 NOTE — Telephone Encounter (Signed)
Called and gave pt lab results.  Pt aware that rx will be called in to pharmacy.

## 2011-01-18 ENCOUNTER — Telehealth: Payer: Self-pay | Admitting: Family Medicine

## 2011-01-18 NOTE — Telephone Encounter (Signed)
Pt is aware to stop diclofenac and pt expressed concern about Cipro but per Dr. Scotty Court pt can take this mediation but if symptoms worsen pt instructed to stop and call the office.

## 2011-01-18 NOTE — Progress Notes (Signed)
Quick Note:  Pt aware ______ 

## 2011-01-18 NOTE — Telephone Encounter (Signed)
Per Dr. Scotty Court pt should stay off medication at this time and not start anything new.  Taking something can be reconsidered in 2 to 3 weeks.

## 2011-01-18 NOTE — Telephone Encounter (Signed)
Pt called and said that she is having problems with Diclofenac Sod 76 mg tabs. Pt is having bloody diarrhea since taking this med. Pt discontinued med on Friday 01/15/11, and problem is starting to clear up. Pls advise.

## 2011-01-25 ENCOUNTER — Other Ambulatory Visit (INDEPENDENT_AMBULATORY_CARE_PROVIDER_SITE_OTHER): Payer: Medicare Other

## 2011-01-25 DIAGNOSIS — IMO0001 Reserved for inherently not codable concepts without codable children: Secondary | ICD-10-CM

## 2011-01-25 DIAGNOSIS — K921 Melena: Secondary | ICD-10-CM

## 2011-01-25 LAB — HEMOCCULT GUIAC POC 1CARD (OFFICE): Card #3 Fecal Occult Blood, POC: NEGATIVE

## 2011-01-27 NOTE — Progress Notes (Signed)
Quick Note:    Letter sent to pt.  ______

## 2011-01-29 ENCOUNTER — Telehealth: Payer: Self-pay | Admitting: Family Medicine

## 2011-01-29 NOTE — Telephone Encounter (Signed)
Was on Vesicare samples. They worked fine. Would like a new rx called in to [facility].

## 2011-01-30 ENCOUNTER — Other Ambulatory Visit: Payer: Self-pay | Admitting: Family Medicine

## 2011-02-01 MED ORDER — SOLIFENACIN SUCCINATE 5 MG PO TABS: 10.0000 mg | ORAL_TABLET | Freq: Every day | ORAL | Status: DC

## 2011-02-01 NOTE — Telephone Encounter (Signed)
rx sent to [facility] on [address] for vesicare 5 mg.

## 2011-05-31 ENCOUNTER — Ambulatory Visit (INDEPENDENT_AMBULATORY_CARE_PROVIDER_SITE_OTHER): Payer: Medicare Other | Admitting: Family Medicine

## 2011-05-31 ENCOUNTER — Encounter: Payer: Self-pay | Admitting: Family Medicine

## 2011-05-31 VITALS — BP 140/68 | HR 78 | Temp 97.8°F | Resp 12 | Ht 66.25 in | Wt 148.0 lb

## 2011-05-31 DIAGNOSIS — R252 Cramp and spasm: Secondary | ICD-10-CM

## 2011-05-31 DIAGNOSIS — M129 Arthropathy, unspecified: Secondary | ICD-10-CM

## 2011-05-31 DIAGNOSIS — R32 Unspecified urinary incontinence: Secondary | ICD-10-CM | POA: Insufficient documentation

## 2011-05-31 DIAGNOSIS — N3941 Urge incontinence: Secondary | ICD-10-CM

## 2011-05-31 MED ORDER — QUININE SULFATE 324 MG PO CAPS: 324.0000 mg | ORAL_CAPSULE | Freq: Every evening | ORAL | Status: DC | PRN

## 2011-05-31 NOTE — Progress Notes (Signed)
  Subjective:    Patient ID: Tara Harris, female    DOB: 1931/07/19, 77 y.o.   MRN: [id number]  HPI  Patient here to reestablish with me. She has seen Dr. Scotty Court for over 50 years. Past medical history reviewed. She is generally very healthy. She has history of allergic rhinitis, mild osteoarthritis, urge urine incontinence, and recurrent leg cramps. She has taken quinine very infrequently for muscle cramps and requesting refill. She takes VESIcare for urge urinary incontinence. Sometimes has dry mouth associated with this. No other side effects.   Very active with gardening and other exercise such as walking. Denies recent headaches, dizziness, chest pain, appetite or weight changes. Blood pressure runs around 140 systolic at home.  immunizations all up to date.  PMH, SH, FH reviewed:  Past Medical History  Diagnosis Date  . Thyroid disease   . Menopause   . IBS (irritable bowel syndrome)    Past Surgical History  Procedure Date  . Tonsillectomy     reports that she has never smoked. She does not have any smokeless tobacco history on file. She reports that she does not drink alcohol or use illicit drugs. family history includes Alzheimer's disease in her mother; Heart attack in her father and sister; and Kidney disease in her brother. No Known Allergies    Review of Systems  Constitutional: Negative for fever, activity change, appetite change and unexpected weight change.  Respiratory: Negative for cough and shortness of breath.   Cardiovascular: Negative for chest pain, palpitations and leg swelling.  Gastrointestinal: Negative for abdominal pain.  Genitourinary: Negative for dysuria.  Musculoskeletal: Negative for gait problem.  Neurological: Negative for dizziness and syncope.  Psychiatric/Behavioral: Negative for dysphoric mood.       Objective:   Physical Exam  Constitutional: She is oriented to person, place, and time. She appears well-developed and well-nourished.  No distress.  HENT:  Right Ear: External ear normal.  Left Ear: External ear normal.  Mouth/Throat: Oropharynx is clear and moist.  Neck: Neck supple. No thyromegaly present.  Cardiovascular: Normal rate, regular rhythm and normal heart sounds.   Pulmonary/Chest: Effort normal and breath sounds normal. No respiratory distress. She has no wheezes. She has no rales.  Musculoskeletal: She exhibits no edema.  Neurological: She is alert and oriented to person, place, and time.          Assessment & Plan:  #1 urge urinary incontinence. Stable on low-dose VESIcare #2 history of leg cramps. Discussed importance of adequate hydration and sodium and potassium replacement.  Infrequent use of quinine and we've recommended very limited use.  She is aware of potential risks with use and that no longer FDA approved for leg cramps but she has used for years for severe cramps with good results and no side effects. #3 osteoarthritis mild.   treat with over-the-counter medications as needed #4 health maintenance. Schedule followup in August for complete physical

## 2011-05-31 NOTE — Patient Instructions (Signed)
Schedule complete physical after January 12 2012.

## 2011-06-02 ENCOUNTER — Telehealth: Payer: Self-pay | Admitting: Family Medicine

## 2011-06-02 DIAGNOSIS — R252 Cramp and spasm: Secondary | ICD-10-CM | POA: Insufficient documentation

## 2011-06-02 NOTE — Telephone Encounter (Signed)
Pt's prior auth for Quinine sulfate was denied.

## 2011-06-06 NOTE — Telephone Encounter (Signed)
Noted.  Make sure pt is aware.  She will need to take  Up with insurance if she has other concerns.

## 2011-06-29 NOTE — Progress Notes (Signed)
  Subjective:    Patient ID: Tara Harris, female    DOB: 02/23/1932, 78 y.o.   MRN: [id number] This 78 year old white married female is in for evaluation of her medical problems and refill her previous prescriptions as well as HEENT necessary indicated lab studies complains of a foot or leg cramps in the early morning when in bed desirous treatment. Also her one of her main complaint since she had hip pain during the night especially when turning over pain in both hips right greater than left. She has taken diclofenac in the past for this but is not doing so this time recently she been doing more physical activity or yard work. Her other complaint is that of urinary urgency has been on Prempro hopeful for 5 days 1.5 mg per tablet per day we have discussed stopping estrogen treatment. And to do a pelvic and Pap smear are durable bowel syndrome is under good control the other complaint she has is that of chronic allergic rhinitis nasal congestion and drainage and discussion the hip pain failed mention that she continues to have some arthritic pain in the left knee HPI    Review of Systems CHPI no other problem     Objective:   Physical Exam the patient is a well-built well-nourished pleasant white female vital signs are all normal HEENT reveals nasal congestion with boggy pale mucosa and clear drainage. Carotid pulses are good thyroid nonpalpable no other masses oral mucosa normal pharynx clear , dental status good  Chest lungs are clear to palpation percussion and auscultation no dullness no wheezing Heart examination reveals no cardiomegaly heart sounds are good no murmurs regular rhythm  Peripheral pulses are good and equal bilaterally  Breasts lamination reveals no abnormalities restful no masses no tenderness nipples everted axilla clear Abdomen liver spleen and kidneys are nonpalpable no masses felt bowel sounds are normal No tenderness Pelvic examination reveals normal external ears normal  no vaginal a cervix and uterus normal adnexal areas negative Pap smear done Extremities tenderness right greater than left discomfort on movement of the right hip Also tenderness medially and laterally of the left knee . Peripheral pulse good bilaterally In no abnormalities noted    .      Assessment & Plan:  Arthritis is normal 1 problem in both hips and left knee to start diclofenac 75 mg twice a day Nocturnal leg cramps to take quinolone for short period time and reevaluate. Chronic allergic rhinitis restart daily antihistamine your Postmenopausal syndrome to finish the Prempro every other day until gone then do not restart herbal bowel syndrome under control

## 2011-07-27 ENCOUNTER — Other Ambulatory Visit: Payer: Self-pay | Admitting: Family Medicine

## 2011-09-30 ENCOUNTER — Encounter: Payer: Self-pay | Admitting: Family Medicine

## 2011-12-06 ENCOUNTER — Encounter: Payer: Self-pay | Admitting: Internal Medicine

## 2011-12-07 ENCOUNTER — Encounter: Payer: Self-pay | Admitting: Internal Medicine

## 2012-01-11 ENCOUNTER — Encounter: Payer: Self-pay | Admitting: Internal Medicine

## 2012-01-11 ENCOUNTER — Ambulatory Visit (AMBULATORY_SURGERY_CENTER): Payer: Medicare Other | Admitting: *Deleted

## 2012-01-11 VITALS — Ht 67.0 in | Wt 139.9 lb

## 2012-01-11 DIAGNOSIS — Z1211 Encounter for screening for malignant neoplasm of colon: Secondary | ICD-10-CM

## 2012-01-11 MED ORDER — MOVIPREP 100 G PO SOLR: ORAL | Status: DC

## 2012-01-13 ENCOUNTER — Encounter: Payer: Medicare Other | Admitting: Family Medicine

## 2012-01-24 ENCOUNTER — Encounter: Payer: Self-pay | Admitting: Family Medicine

## 2012-01-24 ENCOUNTER — Ambulatory Visit (INDEPENDENT_AMBULATORY_CARE_PROVIDER_SITE_OTHER): Payer: Medicare Other | Admitting: Family Medicine

## 2012-01-24 VITALS — BP 140/70 | HR 68 | Temp 98.3°F | Ht 67.5 in | Wt 136.0 lb

## 2012-01-24 DIAGNOSIS — R030 Elevated blood-pressure reading, without diagnosis of hypertension: Secondary | ICD-10-CM

## 2012-01-24 DIAGNOSIS — N3941 Urge incontinence: Secondary | ICD-10-CM

## 2012-01-24 DIAGNOSIS — R229 Localized swelling, mass and lump, unspecified: Secondary | ICD-10-CM

## 2012-01-24 DIAGNOSIS — Z0000 Encounter for general adult medical examination without abnormal findings: Secondary | ICD-10-CM

## 2012-01-24 DIAGNOSIS — E785 Hyperlipidemia, unspecified: Secondary | ICD-10-CM

## 2012-01-24 LAB — BASIC METABOLIC PANEL
CO2: 30 mEq/L (ref 19–32)
Calcium: 9.6 mg/dL (ref 8.4–10.5)
Creatinine, Ser: 0.8 mg/dL (ref 0.4–1.2)
Glucose, Bld: 98 mg/dL (ref 70–99)

## 2012-01-24 LAB — CBC WITH DIFFERENTIAL/PLATELET
Basophils Relative: 0.8 % (ref 0.0–3.0)
Eosinophils Relative: 3.2 % (ref 0.0–5.0)
HCT: 38 % (ref 36.0–46.0)
MCV: 96.3 fl (ref 78.0–100.0)
Monocytes Absolute: 0.5 10*3/uL (ref 0.1–1.0)
Monocytes Relative: 9.6 % (ref 3.0–12.0)
Neutrophils Relative %: 55.6 % (ref 43.0–77.0)
RBC: 3.95 Mil/uL (ref 3.87–5.11)
WBC: 5.4 10*3/uL (ref 4.5–10.5)

## 2012-01-24 LAB — POCT URINALYSIS DIPSTICK
Protein, UA: NEGATIVE
Spec Grav, UA: 1.015
Urobilinogen, UA: 0.2

## 2012-01-24 LAB — HEPATIC FUNCTION PANEL
ALT: 16 U/L (ref 0–35)
AST: 12 U/L (ref 0–37)
Bilirubin, Direct: 0 mg/dL (ref 0.0–0.3)
Total Protein: 7.4 g/dL (ref 6.0–8.3)

## 2012-01-24 LAB — TSH: TSH: 3.1 u[IU]/mL (ref 0.35–5.50)

## 2012-01-24 LAB — LIPID PANEL: HDL: 44.2 mg/dL (ref >39.00)

## 2012-01-24 NOTE — Progress Notes (Signed)
Subjective:    Patient ID: Tara Harris, female    DOB: Oct 31, 1931, 78 y.o.   MRN: [id number]  HPI  Patient here for Medicare wellness exam and medical followup. Has past medical history of hyperlipidemia, allergic rhinitis, osteoarthritis, irritable bowel syndrome and urge urine incontinence. She takes VESIcare for that. She takes some over-the-counter supplements.  Incontinence symptoms are fairly well controlled but she does have dry mouth with VESIcare. No constipation issues.  History of hyperlipidemia but not treated with statin. No history of coronary disease or peripheral vascular disease. No major risk factors other than age.  Immunizations all up to date. Receive yearly flu vaccine. Pap smear 1 year ago normal. Scheduled for repeat colonoscopy tomorrow. History of tubular adenoma. Recent mammogram reportedly normal.  Small nodular lesion left lateral leg. Present for couple of years. Possibly slow growth. No personal history of skin cancer.  Past Medical History  Diagnosis Date  . Thyroid disease   . Menopause   . IBS (irritable bowel syndrome)   . Arthritis    Past Surgical History  Procedure Date  . Tonsillectomy   . Cataract extraction, bilateral 2012    reports that she has never smoked. She has never used smokeless tobacco. She reports that she does not drink alcohol or use illicit drugs. family history includes Alzheimer's disease in her mother; Heart attack in her father and sister; and Kidney disease in her brother.  There is no history of Colon cancer and Stomach cancer. No Known Allergies  1.  Risk factors based on Past Medical , Social, and Family history reviewed as above 2.  Limitations in physical activities stays very active. Low risk for fall 3.  Depression/mood no depression or anxiety issues 4.  Hearing intact 5.  ADLs independent in all 6.  Cognitive function (orientation to time and place, language, writing, speech,memory) no memory deficits.  Judgment and language intact. 7.  Home Safety no issues 8.  Height, weight, and visual acuity. Height and weight stable. No recent visual changes 9.  Counseling 10. Recommendation of preventive services. Continue yearly flu vaccine. We discussed no need for Pap smear this time. Continue yearly mammogram 11. Labs based on risk factors lipids, hepatic, basic metabolic panel 12. Care Plan as above     Review of Systems  Constitutional: Negative for fever, activity change, appetite change, fatigue and unexpected weight change.  HENT: Negative for hearing loss, ear pain, sore throat and trouble swallowing.   Eyes: Negative for visual disturbance.  Respiratory: Negative for cough and shortness of breath.   Cardiovascular: Negative for chest pain and palpitations.  Gastrointestinal: Negative for abdominal pain, diarrhea, constipation and blood in stool.  Genitourinary: Negative for dysuria and hematuria.  Musculoskeletal: Negative for myalgias, back pain and arthralgias.  Skin: Negative for rash.  Neurological: Negative for dizziness, syncope and headaches.  Hematological: Negative for adenopathy.  Psychiatric/Behavioral: Negative for confusion and dysphoric mood.       Objective:   Physical Exam  Constitutional: She is oriented to person, place, and time. She appears well-developed and well-nourished.  HENT:  Head: Normocephalic and atraumatic.  Eyes: EOM are normal. Pupils are equal, round, and reactive to light.  Neck: Normal range of motion. Neck supple. No thyromegaly present.  Cardiovascular: Normal rate, regular rhythm and normal heart sounds.   No murmur heard. Pulmonary/Chest: Breath sounds normal. No respiratory distress. She has no wheezes. She has no rales.  Abdominal: Soft. Bowel sounds are normal. She exhibits no distension and  no mass. There is no tenderness. There is no rebound and no guarding.  Genitourinary:       Breasts are symmetric with no mass. Pelvic deferred  with normal Pap smear last year  Musculoskeletal: Normal range of motion. She exhibits no edema.  Lymphadenopathy:    She has no cervical adenopathy.  Neurological: She is alert and oriented to person, place, and time. She displays normal reflexes. No cranial nerve deficit.  Skin: No rash noted.       Patient has approximately 6-7 mm nodule left lateral leg. Slight dimple near center. No ulceration.  Multiple seborrheic keratoses on her trunk  Psychiatric: She has a normal mood and affect. Her behavior is normal. Judgment and thought content normal.          Assessment & Plan:  #1 health maintenance. Immunizations are up to date. Continue yearly flu vaccine. No need for further Pap smears. Continue yearly mammogram. She has scheduled colonoscopy repeat tomorrow. Obtain screening lab work #2 history of urinary urgency. Stable on VESIcare. She has dry mouth which is related. Reduce VESIcare once daily  #3 nodule left lateral leg. Possible basal cell carcinoma. Patient will schedule followup for excision #4 history of hyperlipidemia. Repeat lipid and hepatic panel #5 elevated blood pressure, borderline. Recheck at followup in one month

## 2012-01-24 NOTE — Patient Instructions (Addendum)
Remember to get yearly flu vaccine Continue yearly mammogram Schedule followup appointment for skin lesion excision left leg

## 2012-01-25 ENCOUNTER — Ambulatory Visit (AMBULATORY_SURGERY_CENTER): Payer: Medicare Other | Admitting: Internal Medicine

## 2012-01-25 ENCOUNTER — Encounter: Payer: Self-pay | Admitting: Internal Medicine

## 2012-01-25 VITALS — BP 138/73 | HR 71 | Temp 96.6°F | Resp 22 | Ht 67.0 in | Wt 139.0 lb

## 2012-01-25 DIAGNOSIS — Z860100 Personal history of colon polyps, unspecified: Secondary | ICD-10-CM

## 2012-01-25 DIAGNOSIS — Z1211 Encounter for screening for malignant neoplasm of colon: Secondary | ICD-10-CM

## 2012-01-25 MED ORDER — SODIUM CHLORIDE 0.9 % IV SOLN: 500.0000 mL | INTRAVENOUS | Status: DC

## 2012-01-25 NOTE — Patient Instructions (Addendum)
I could not complete the colonoscopy because there was retained fecal material in the colon preventing good views.  I need you to schedule an office visit to review things and talk about how to prep for another colonoscopy.  Thank you for choosing me and [facility].  Iva Boop, MD, [facility]   Daily miralax suggested for constipation. You may purchase this over the counter.  YOU HAD AN ENDOSCOPIC PROCEDURE TODAY AT THE [facility]: Refer to the procedure report that was given to you for any specific questions about what was found during the examination.  If the procedure report does not answer your questions, please call your gastroenterologist to clarify.  If you requested that your care partner not be given the details of your procedure findings, then the procedure report has been included in a sealed envelope for you to review at your convenience later.  YOU SHOULD EXPECT: Some feelings of bloating in the abdomen. Passage of more gas than usual.  Walking can help get rid of the air that was put into your GI tract during the procedure and reduce the bloating. If you had a lower endoscopy (such as a colonoscopy or flexible sigmoidoscopy) you may notice spotting of blood in your stool or on the toilet paper. If you underwent a bowel prep for your procedure, then you may not have a normal bowel movement for a few days.  DIET: Your first meal following the procedure should be a light meal and then it is ok to progress to your normal diet.  A half-sandwich or bowl of soup is an example of a good first meal.  Heavy or fried foods are harder to digest and may make you feel nauseous or bloated.  Likewise meals heavy in dairy and vegetables can cause extra gas to form and this can also increase the bloating.  Drink plenty of fluids but you should avoid alcoholic beverages for 24 hours.  ACTIVITY: Your care partner should take you home directly after the procedure.  You should  plan to take it easy, moving slowly for the rest of the day.  You can resume normal activity the day after the procedure however you should NOT DRIVE or use heavy machinery for 24 hours (because of the sedation medicines used during the test).    SYMPTOMS TO REPORT IMMEDIATELY: A gastroenterologist can be reached at any hour.  During normal business hours, 8:30 AM to 5:00 PM Monday through Friday, call [phone].  After hours and on weekends, please call the GI answering service at [phone] who will take a message and have the physician on call contact you.   Following lower endoscopy (colonoscopy or flexible sigmoidoscopy):  Excessive amounts of blood in the stool  Significant tenderness or worsening of abdominal pains  Swelling of the abdomen that is new, acute  Fever of 100F or higher  FOLLOW UP: If any biopsies were taken you will be contacted by phone or by letter within the next 1-3 weeks.  Call your gastroenterologist if you have not heard about the biopsies in 3 weeks.  Our staff will call the home number listed on your records the next business day following your procedure to check on you and address any questions or concerns that you may have at that time regarding the information given to you following your procedure. This is a courtesy call and so if there is no answer at the home number and we have not heard from you through  the emergency physician on call, we will assume that you have returned to your regular daily activities without incident.  SIGNATURES/CONFIDENTIALITY: You and/or your care partner have signed paperwork which will be entered into your electronic medical record.  These signatures attest to the fact that that the information above on your After Visit Summary has been reviewed and is understood.  Full responsibility of the confidentiality of this discharge information lies with you and/or your care-partner.

## 2012-01-25 NOTE — Op Note (Signed)
[facility] [address] [place], [address]   COLONOSCOPY PROCEDURE REPORT  PATIENT: Tara Harris, Tara Harris  MR#: [id number] BIRTHDATE: [birth year] , 80  yrs. old GENDER: Female ENDOSCOPIST: Iva Boop, MD, [facility] REFERRED BY: PROCEDURE DATE:  01/25/2012 PROCEDURE:   Colonoscopy, incomplete ASA CLASS:   Class II INDICATIONS:high risk (screening and surveillance) patient with personal history of colonic polyps. MEDICATIONS: propofol (Diprivan) 100mg  IV, MAC sedation, administered by CRNA, and These medications were titrated to patient response per physician's verbal order  DESCRIPTION OF PROCEDURE:   After the risks benefits and alternatives of the procedure were thoroughly explained, informed consent was obtained.  A digital rectal exam revealed no abnormalities of the rectum.   The LB CF-H180AL [id number]  endoscope was introduced through the anus and advanced to the descending colon. No adverse events experienced.   Limited by poor preparation.   The quality of the prep was poor, using MoviPrep The instrument was then slowly withdrawn as the colon was fully examined.      COLON FINDINGS: Retained stool seen in descending colon and proximal - large clumps/balls of firm stool that precluded adequate views. The colon mucosa was otherwise normal.    Retroflexed views revealed no abnormalities.         .  The scope was withdrawn and the procedure completed. COMPLICATIONS: There were no complications. ENDOSCOPIC IMPRESSION: 1.   Retained stool - large clumps/balls of firm stool that precluded adequate views. 2.   The colon mucosa was otherwise normal  RECOMMENDATIONS: Schedule office visit to discuss alternative preps and repeat colonoscopy.  eSigned:  Iva Boop, MD, [facility] 01/25/2012 12:16 PM   cc:The Patient

## 2012-01-25 NOTE — Progress Notes (Signed)
Patient did not experience any of the following events: a burn prior to discharge; a fall within the facility; wrong site/side/patient/procedure/implant event; or a hospital transfer or hospital admission upon discharge from the facility. (G8907) Patient did not have preoperative order for IV antibiotic SSI prophylaxis. (G8918)  

## 2012-01-26 ENCOUNTER — Telehealth: Payer: Self-pay | Admitting: *Deleted

## 2012-01-26 NOTE — Progress Notes (Signed)
Quick Note:  Called and spoke with pt and pt is aware of lab results. ______ 

## 2012-01-26 NOTE — Telephone Encounter (Signed)
  Follow up Call-  Call back number 01/25/2012  Post procedure Call Back phone  # [phone]  Permission to leave phone message Yes     Patient questions:  Do you have a fever, pain , or abdominal swelling? no Pain Score  0 *  Have you tolerated food without any problems? yes  Have you been able to return to your normal activities? yes  Do you have any questions about your discharge instructions: Diet   no Medications  no Follow up visit  no  Do you have questions or concerns about your Care? no  Actions: * If pain score is 4 or above: No action needed, pain <4.

## 2012-02-08 ENCOUNTER — Telehealth: Payer: Self-pay | Admitting: Internal Medicine

## 2012-02-08 NOTE — Telephone Encounter (Signed)
Patient had an incomplete colonoscopy and and is wanting an office visit as recommended at the time of the procedure to discuss,  REV scheduled for 03/14/12

## 2012-02-24 ENCOUNTER — Encounter: Payer: Self-pay | Admitting: Family Medicine

## 2012-02-24 ENCOUNTER — Ambulatory Visit (INDEPENDENT_AMBULATORY_CARE_PROVIDER_SITE_OTHER): Payer: Medicare Other | Admitting: Family Medicine

## 2012-02-24 VITALS — BP 130/62 | Temp 98.1°F

## 2012-02-24 DIAGNOSIS — L989 Disorder of the skin and subcutaneous tissue, unspecified: Secondary | ICD-10-CM

## 2012-02-24 DIAGNOSIS — C44711 Basal cell carcinoma of skin of unspecified lower limb, including hip: Secondary | ICD-10-CM

## 2012-02-24 NOTE — Progress Notes (Signed)
  Subjective:    Patient ID: Tara Harris, female    DOB: [birth year], 78 y.o.   MRN: [id number]  HPI  Small nodular skin lesion noted left lateral leg. Noted on recent physical. Asymptomatic. Present for several months. No rapid growth. No personal history of skin cancer.   Review of Systems  Constitutional: Negative for fever, chills, appetite change and unexpected weight change.  Hematological: Negative for adenopathy.       Objective:   Physical Exam  Constitutional: She appears well-developed and well-nourished.  Cardiovascular: Normal rate and regular rhythm.   Pulmonary/Chest: Effort normal and breath sounds normal. No respiratory distress. She has no wheezes. She has no rales.  Skin:       Left lateral leg reveals approximately 5-6 mm slightly raised fairly well-demarcated papular lesion. Few scattered telangiectasias noted the surface. No ulceration          Assessment & Plan:  Papular lesion left lateral leg. Rule out basal cell cancer.  Discussed risks and benefits of shave excision including risks of bleeding, bruising, scar formation, and infection and patient consented to proceed.  Skin prepped with betadine and alcohol and shave excision of lesion with #15 blade with minimal bleeding-controlled with silver nitrate.  Patient tolerated well.  Antibiotic and dressing  Applied. Wound care instructions given

## 2012-02-24 NOTE — Patient Instructions (Addendum)
Keep wound dry for the first 24 hours then clean daily with soap and water for one week. Apply topical antibiotic daily for 3-4 days. Keep covered with clean dressing for 4-5 days. Follow up promptly for any signs of infection such as redness, warmth, pain, or drainage.  

## 2012-02-28 NOTE — Progress Notes (Signed)
Quick Note:  Pt informed and is scheduling F/U ______

## 2012-03-14 ENCOUNTER — Ambulatory Visit (INDEPENDENT_AMBULATORY_CARE_PROVIDER_SITE_OTHER): Payer: Medicare Other | Admitting: Internal Medicine

## 2012-03-14 ENCOUNTER — Encounter: Payer: Self-pay | Admitting: Internal Medicine

## 2012-03-14 VITALS — BP 132/60 | HR 80 | Ht 67.0 in | Wt 141.0 lb

## 2012-03-14 DIAGNOSIS — Z1211 Encounter for screening for malignant neoplasm of colon: Secondary | ICD-10-CM

## 2012-03-14 DIAGNOSIS — Z86010 Personal history of colonic polyps: Secondary | ICD-10-CM

## 2012-03-14 MED ORDER — NA SULFATE-K SULFATE-MG SULF 17.5-3.13-1.6 GM/177ML PO SOLN: ORAL | Status: DC

## 2012-03-14 MED ORDER — POLYETHYLENE GLYCOL 3350 17 GM/SCOOP PO POWD: ORAL | Status: DC

## 2012-03-14 NOTE — Patient Instructions (Addendum)
You have been scheduled for a colonoscopy with propofol. Please follow written instructions given to you at your visit today.  Please use the prep kit we have given you today. If you use inhalers (even only as needed), please bring them with you on the day of your procedure.  Special Instructions as follows:  Take a dose of Miralax daily for a week prior to your colonoscopy.  On 03/19/12 take 2 over the counter Doculax tablets, and 03/20/12 take 4 Doculax.  This will help to make sure your colon is prepped well for the colonoscopy.  Thank you for choosing me and [facility].  Iva Boop, M.D., [facility]

## 2012-03-14 NOTE — Progress Notes (Signed)
Patient ID: Tara Harris, female   DOB: May 16, 1932, 78 y.o.   MRN: [id number] Infrequent bowel movements chronically  Dulcolax 2 po 2 days before prep  Dulcolax 4 po 1 day before prep  Miralax 1 dose/day daily x 1 week before prep

## 2012-03-17 ENCOUNTER — Encounter: Payer: Self-pay | Admitting: Family Medicine

## 2012-03-17 ENCOUNTER — Ambulatory Visit (INDEPENDENT_AMBULATORY_CARE_PROVIDER_SITE_OTHER): Payer: Medicare Other | Admitting: Family Medicine

## 2012-03-17 VITALS — BP 140/70 | Temp 98.2°F | Wt 142.0 lb

## 2012-03-17 DIAGNOSIS — C4491 Basal cell carcinoma of skin, unspecified: Secondary | ICD-10-CM

## 2012-03-17 DIAGNOSIS — C44711 Basal cell carcinoma of skin of unspecified lower limb, including hip: Secondary | ICD-10-CM

## 2012-03-17 NOTE — Patient Instructions (Addendum)
Basal Cell Carcinoma Basal cell carcinoma is the most common form of skin cancer. It begins in the basal cells, which are at the bottom of the outer skin layer (epidermis). CAUSES  Sun exposure is the most common cause of basal cell carcinoma. Basal cell carcinoma occurs most often on parts of the body that are frequently exposed to the sun, including the:  Scalp.  Ears.  Neck.  Face.  Arms.  Backs of the hands.  Legs. However, basal cell carcinoma can occur anywhere on the body. Rarely, tumors develop on areas not exposed to the sun. Other causes of basal cell carcinoma can include:  Exposure to arsenic.  Exposure to radiation.  Certain genetic syndromes, such as xeroderma pigmentosum. RISK FACTORS People at highest risk for basal cell carcinoma include those with:  Fair skin.  Blonde or red hair.  Blue, green, or gray eyes.  Childhood freckling. Factors that increase your risk for basal cell carcinoma include:  Sun exposure over long periods of time. Childhood sun exposure appears to be a more significant factor than sun exposure as an adult.  Repeated sunburns.  Use of tanning beds.  Having a weakened immune system. SYMPTOMS Five signs of basal cell carcinoma are:  An open sore that bleeds, oozes, or crusts. The sore may remain open for 3 or more weeks. This can be an early sign of basal cell carcinoma. Basal cell carcinoma can mimic a pimple that will not heal.  A reddish or irritated area which may crust, itch, or cause discomfort. This may occur on areas expose d to the sun. These patches might be easier felt than seen.  A shiny, pearly, or translucent bump that is pink, red, or white. The bump may also be tan, black, or brown, especially in dark haired people. These bumps can be confused with moles.  A pink growth with a slightly elevated, rolled border, and a crusted indentation in the center. As the growth slowly enlarges, tiny blood vessels may develop  on the surface.  A scar-like white, yellow, or waxy area that looks like shiny, stretched skin. It often has irregular borders. This may be a sign of more aggressive basal cell carcinoma. DIAGNOSIS  Your caregiver may be able to tell what is wrong by doing a physical exam. Often, a tissue sample (biopsy) is also taken. The tissue is examined under a microscope.  TREATMENT  The treatment for basal cell carcinoma depends on the type, size, location, and number of tumors. Possible treatments include:   Mohs surgery. This is a procedure done by a skin doctor (dermatologist or Mohs surgeon) in his or her office. The cancerous cells are removed layer by layer. This treatment has a high cure rate.  Surgical removal of the tumor.  Freezing the tumor with liquid nitrogen (cryosurgery).  Plastic surgery to remove the tumor, in the case of large tumors.  Radiation. This may be used for tumors on the face.  Photodynamic therapy. A chemical cream is applied to the skin and light exposure is used to activate the chemical.  Chemical treatments, such as imiquimod cream and interferon injections. This may be used to remove superficial tumors with minimal scarring.  Electrodesiccation and curettage. This involves alternately scraping and burning the tumor, using an electric current to control bleeding. Basal cell carcinoma can almost always be cured. It rarely spreads to other areas of the body (metastasizes). Basal cell carcinoma may come back at the same location (recur), but it can be treated   again if this occurs. PREVENTION  Avoid the sun between 10:00 a.m. and 4:00 pm when it is the strongest.  Use a sunscreen or sunblock with a sun protection factor of 30 or greater.  Apply sunscreen at least 30 minutes before exposure to the sun.  Reapply sunscreen every 2 to 4 hours while you are outside, after swimming, and after excessive sweating.  Always wear protective hats, clothing, and sunglasses with  ultraviolet protection.  Avoid tanning beds. HOME CARE INSTRUCTIONS   Avoid unprotected sun exposure.  Follow your caregiver's instructions for self-exams. Look for new spots or changes in your skin.  Keep all follow-up appointments as directed by your caregiver. SEEK MEDICAL CARE IF:   You notice any new spots or changes in your skin.  You have had a basal cell carcinoma tumor removed and you notice a new growth in the same location. Document Released: 11/28/2002 Document Revised: 11/23/2011 Document Reviewed: 02/15/2011 ExitCare Patient Information 2013 [employer].  

## 2012-03-17 NOTE — Progress Notes (Signed)
  Subjective:    Patient ID: Tara Harris, female    DOB: 05-23-1932, 78 y.o.   MRN: [id number]  HPI  Followup excision basal cell carcinoma left leg. No issues with healling. No signs of secondary infection. She's been applying some Vaseline to soften. She's not had any other prior skin cancers.   Review of Systems  Constitutional: Negative for appetite change and unexpected weight change.       Objective:   Physical Exam  Constitutional: She appears well-developed and well-nourished.  Cardiovascular: Normal rate and regular rhythm.   Pulmonary/Chest: Effort normal and breath sounds normal. No respiratory distress. She has no wheezes. She has no rales.  Skin:       Small eschar left lateral leg from recent excision of basal cell carcinoma. No erythema. No warmth. No drainage.          Assessment & Plan:  Basal cell carcinoma left lateral leg.  Healing well without difficulty. Continue good wound care. Reassess at followup in about 6 months for any evidence for recurrence

## 2012-03-19 DIAGNOSIS — C4491 Basal cell carcinoma of skin, unspecified: Secondary | ICD-10-CM | POA: Insufficient documentation

## 2012-03-21 ENCOUNTER — Ambulatory Visit (AMBULATORY_SURGERY_CENTER): Payer: Medicare Other | Admitting: Internal Medicine

## 2012-03-21 ENCOUNTER — Encounter: Payer: Self-pay | Admitting: Internal Medicine

## 2012-03-21 VITALS — BP 118/71 | HR 73 | Temp 97.9°F | Resp 16 | Ht 67.0 in | Wt 141.0 lb

## 2012-03-21 DIAGNOSIS — Z86010 Personal history of colonic polyps: Secondary | ICD-10-CM

## 2012-03-21 DIAGNOSIS — Z1211 Encounter for screening for malignant neoplasm of colon: Secondary | ICD-10-CM

## 2012-03-21 MED ORDER — SODIUM CHLORIDE 0.9 % IV SOLN: 500.0000 mL | INTRAVENOUS | Status: DC

## 2012-03-21 NOTE — Progress Notes (Signed)
Patient did not experience any of the following events: a burn prior to discharge; a fall within the facility; wrong site/side/patient/procedure/implant event; or a hospital transfer or hospital admission upon discharge from the facility. (G8907) Patient did not have preoperative order for IV antibiotic SSI prophylaxis. (G8918)  

## 2012-03-21 NOTE — Progress Notes (Signed)
No complaints noted in the recovery room. Maw   

## 2012-03-21 NOTE — Patient Instructions (Addendum)
NO POLYPS!  You do not need anymore routine colonoscopies.  Please see me as needed.  Iva Boop, MD, FACG    YOU HAD AN ENDOSCOPIC PROCEDURE TODAY AT THE [facility]: Refer to the procedure report that was given to you for any specific questions about what was found during the examination.  If the procedure report does not answer your questions, please call your gastroenterologist to clarify.  If you requested that your care partner not be given the details of your procedure findings, then the procedure report has been included in a sealed envelope for you to review at your convenience later.  YOU SHOULD EXPECT: Some feelings of bloating in the abdomen. Passage of more gas than usual.  Walking can help get rid of the air that was put into your GI tract during the procedure and reduce the bloating. If you had a lower endoscopy (such as a colonoscopy or flexible sigmoidoscopy) you may notice spotting of blood in your stool or on the toilet paper. If you underwent a bowel prep for your procedure, then you may not have a normal bowel movement for a few days.  DIET: Your first meal following the procedure should be a light meal and then it is ok to progress to your normal diet.  A half-sandwich or bowl of soup is an example of a good first meal.  Heavy or fried foods are harder to digest and may make you feel nauseous or bloated.  Likewise meals heavy in dairy and vegetables can cause extra gas to form and this can also increase the bloating.  Drink plenty of fluids but you should avoid alcoholic beverages for 24 hours.  ACTIVITY: Your care partner should take you home directly after the procedure.  You should plan to take it easy, moving slowly for the rest of the day.  You can resume normal activity the day after the procedure however you should NOT DRIVE or use heavy machinery for 24 hours (because of the sedation medicines used during the test).    SYMPTOMS TO REPORT  IMMEDIATELY: A gastroenterologist can be reached at any hour.  During normal business hours, 8:30 AM to 5:00 PM Monday through Friday, call [phone].  After hours and on weekends, please call the GI answering service at [phone] who will take a message and have the physician on call contact you.   Following lower endoscopy (colonoscopy or flexible sigmoidoscopy):  Excessive amounts of blood in the stool  Significant tenderness or worsening of abdominal pains  Swelling of the abdomen that is new, acute  Fever of 100F or higher    FOLLOW UP: If any biopsies were taken you will be contacted by phone or by letter within the next 1-3 weeks.  Call your gastroenterologist if you have not heard about the biopsies in 3 weeks.  Our staff will call the home number listed on your records the next business day following your procedure to check on you and address any questions or concerns that you may have at that time regarding the information given to you following your procedure. This is a courtesy call and so if there is no answer at the home number and we have not heard from you through the emergency physician on call, we will assume that you have returned to your regular daily activities without incident.  SIGNATURES/CONFIDENTIALITY: You and/or your care partner have signed paperwork which will be entered into your electronic medical record.  These signatures attest  to the fact that that the information above on your After Visit Summary has been reviewed and is understood.  Full responsibility of the confidentiality of this discharge information lies with you and/or your care-partner.

## 2012-03-21 NOTE — Op Note (Signed)
[facility] [address] [place], [address]   COLONOSCOPY PROCEDURE REPORT  PATIENT: Tara Harris, Tara Harris  MR#: [id number] BIRTHDATE: November 26, 1931 , 80  yrs. old GENDER: Female ENDOSCOPIST: Iva Boop, MD, [facility] REFERRED BY: PROCEDURE DATE:  03/21/2012 PROCEDURE:   Colonoscopy, diagnostic ASA CLASS:   Class II INDICATIONS:screening and surveillance,personal history of colonic polyps and patient's personal history of adenomatous colon polyps. 2 diminutive adenomas in past- last 2008 MEDICATIONS: Propofol (Diprivan) 160 mg IV, MAC sedation, administered by CRNA, and These medications were titrated to patient response per physician's verbal order  DESCRIPTION OF PROCEDURE:   After the risks benefits and alternatives of the procedure were thoroughly explained, informed consent was obtained.  A digital rectal exam revealed no abnormalities of the rectum.   The LB CF-H180AL [id number]  endoscope was introduced through the anus and advanced to the cecum, which was identified by both the appendix and ileocecal valve. No adverse events experienced.   The quality of the prep was Suprep excellent The instrument was then slowly withdrawn as the colon was fully examined.      COLON FINDINGS: The colonic mucosa appeared normal throughout the entire examined colon.   Small internal hemorrhoids were found. Retroflexed views revealed internal hemorrhoids. The time to cecum=minutes 0 seconds.  Withdrawal time=8 minutes 21 seconds. The scope was withdrawn and the procedure completed. COMPLICATIONS: There were no complications.  ENDOSCOPIC IMPRESSION: 1.   The colonic mucosa appeared normal throughout the entire examined colon - excellent prep 2.   Small internal hemorrhoids  RECOMMENDATIONS: Return to the care of your primary provider.  GI follow up as needed - no more routine colonoscopy given findings and age   eSigned:  Iva Boop, MD, [facility] 03/21/2012 12:34  PM   cc: The Patient

## 2012-03-22 ENCOUNTER — Telehealth: Payer: Self-pay

## 2012-03-22 NOTE — Telephone Encounter (Signed)
Left message on answering machine. 

## 2012-09-04 ENCOUNTER — Other Ambulatory Visit: Payer: Self-pay | Admitting: Dermatology

## 2012-10-21 ENCOUNTER — Encounter: Payer: Self-pay | Admitting: Family Medicine

## 2013-05-21 ENCOUNTER — Encounter: Payer: Self-pay | Admitting: Family Medicine

## 2013-05-21 ENCOUNTER — Ambulatory Visit (INDEPENDENT_AMBULATORY_CARE_PROVIDER_SITE_OTHER): Payer: Medicare Other | Admitting: Family Medicine

## 2013-05-21 VITALS — BP 130/66 | HR 72 | Temp 97.8°F | Ht 67.0 in | Wt 145.0 lb

## 2013-05-21 DIAGNOSIS — N3941 Urge incontinence: Secondary | ICD-10-CM

## 2013-05-21 DIAGNOSIS — E785 Hyperlipidemia, unspecified: Secondary | ICD-10-CM

## 2013-05-21 DIAGNOSIS — Z0000 Encounter for general adult medical examination without abnormal findings: Secondary | ICD-10-CM

## 2013-05-21 LAB — HEPATIC FUNCTION PANEL
ALT: 20 U/L (ref 0–35)
AST: 18 U/L (ref 0–37)
Albumin: 4.3 g/dL (ref 3.5–5.2)
Total Protein: 7.5 g/dL (ref 6.0–8.3)

## 2013-05-21 LAB — BASIC METABOLIC PANEL
Calcium: 9.8 mg/dL (ref 8.4–10.5)
GFR: 67.51 mL/min (ref >60.00)
Sodium: 139 mEq/L (ref 135–145)

## 2013-05-21 LAB — CBC WITH DIFFERENTIAL/PLATELET
Basophils Absolute: 0.1 10*3/uL (ref 0.0–0.1)
Basophils Relative: 1.1 % (ref 0.0–3.0)
Hemoglobin: 12.5 g/dL (ref 12.0–15.0)
Lymphocytes Relative: 37.9 % (ref 12.0–46.0)
MCV: 93.9 fl (ref 78.0–100.0)
Monocytes Absolute: 0.7 10*3/uL (ref 0.1–1.0)
Monocytes Relative: 10.1 % (ref 3.0–12.0)
Neutro Abs: 3 10*3/uL (ref 1.4–7.7)
RBC: 3.95 Mil/uL (ref 3.87–5.11)
RDW: 14 % (ref 11.5–14.6)
WBC: 6.6 10*3/uL (ref 4.5–10.5)

## 2013-05-21 LAB — LIPID PANEL
Cholesterol: 235 mg/dL — ABNORMAL HIGH (ref 0–200)
Triglycerides: 197 mg/dL — ABNORMAL HIGH (ref 0.0–149.0)

## 2013-05-21 NOTE — Progress Notes (Signed)
Subjective:    Patient ID: Tara Harris, female    DOB: 06-10-1931, 79 y.o.   MRN: [id number]  HPI Patient is seen for wellness visit Generally very healthy. She takes no prescription medications. Previous took VESIcare but had problems with dry skin. She does have problems with urinary urgency but no stress incontinence. She drinks very little caffeine. No alcohol.  Immunizations are up-to-date. She's had previous colonoscopy just last year and this was normal. She does not plan to get any further colonoscopies. Stays very active. No recent falls. Low risk for fall.  History of hyperlipidemia but never treated medically.  No hx of smoking, DM, or HTN.    Past Medical History  Diagnosis Date  . Menopause   . IBS (irritable bowel syndrome)   . Arthritis   . Tubular adenoma 01/13/2007   Past Surgical History  Procedure Laterality Date  . Tonsillectomy    . Cataract extraction, bilateral  2012  . Colonoscopy    . Biopsy left leg      still has scab on it pt states    reports that she has never smoked. She has never used smokeless tobacco. She reports that she does not drink alcohol or use illicit drugs. family history includes Alzheimer's disease in her father and mother; Heart attack in her father and sister; Kidney disease in her brother. There is no history of Colon cancer or Stomach cancer. No Known Allergies  1.  Risk factors based on Past Medical , Social, and Family history reviewed and as indicated above 2.  Limitations in physical activities very active. No recent falls 3.  Depression/mood no depression or anxiety issues 4.  Hearing no major deficits 5.  ADLs independent in all 6.  Cognitive function (orientation to time and place, language, writing, speech,memory) memory intact. Language and judgment intact 7.  Home Safety no issues identified 8.  Height, weight, and visual acuity. All stable 9.  Counseling continue regular weightbearing exercise. She will continue  with yearly mammograms 10. Recommendation of preventive services. Immunizations up-to-date 11. Labs based on risk factors CBC, basic metabolic panel, hepatic panel, lipid panel 12. Care Plan as above     Review of Systems  Constitutional: Negative for fever, activity change, appetite change, fatigue and unexpected weight change.  HENT: Negative for ear pain, hearing loss, sore throat and trouble swallowing.   Eyes: Negative for visual disturbance.  Respiratory: Negative for cough and shortness of breath.   Cardiovascular: Negative for chest pain and palpitations.  Gastrointestinal: Negative for abdominal pain, diarrhea, constipation and blood in stool.  Genitourinary: Positive for urgency. Negative for dysuria, hematuria, decreased urine volume and vaginal bleeding.  Musculoskeletal: Negative for arthralgias, back pain and myalgias.  Skin: Negative for rash.  Neurological: Negative for dizziness, syncope and headaches.  Hematological: Negative for adenopathy.  Psychiatric/Behavioral: Negative for confusion and dysphoric mood.       Objective:   Physical Exam  Constitutional: She is oriented to person, place, and time. She appears well-developed and well-nourished.  HENT:  Head: Normocephalic and atraumatic.  Eyes: EOM are normal. Pupils are equal, round, and reactive to light.  Neck: Normal range of motion. Neck supple. No thyromegaly present.  Cardiovascular: Normal rate, regular rhythm and normal heart sounds.   No murmur heard. Pulmonary/Chest: Breath sounds normal. No respiratory distress. She has no wheezes. She has no rales.  Abdominal: Soft. Bowel sounds are normal. She exhibits no distension and no mass. There is no tenderness. There  is no rebound and no guarding.  Musculoskeletal: Normal range of motion. She exhibits no edema.  Lymphadenopathy:    She has no cervical adenopathy.  Neurological: She is alert and oriented to person, place, and time. She displays normal  reflexes. No cranial nerve deficit.  Skin: No rash noted.  Psychiatric: She has a normal mood and affect. Her behavior is normal. Judgment and thought content normal.          Assessment & Plan:  #1 complete physical. Recommend continued calcium and vitamin D. Lab work ordered. Flu vaccine already given. #2 urinary urgency. Continue to minimize caffeine use. We discussed possible other medications such as Myrbetriq at this point she does not wish to pursue that. #3 history of hyperlipidemia but overall fairly low risk for CAD.  Repeat lipids.

## 2013-05-21 NOTE — Progress Notes (Signed)
Pre visit review using our clinic review tool, if applicable. No additional management support is needed unless otherwise documented below in the visit note. 

## 2013-05-29 ENCOUNTER — Other Ambulatory Visit: Payer: Self-pay | Admitting: Family Medicine

## 2013-05-29 ENCOUNTER — Other Ambulatory Visit: Payer: Self-pay

## 2013-05-29 DIAGNOSIS — E785 Hyperlipidemia, unspecified: Secondary | ICD-10-CM

## 2013-05-29 MED ORDER — ATORVASTATIN CALCIUM 20 MG PO TABS: 20.0000 mg | ORAL_TABLET | Freq: Every day | ORAL | Status: DC

## 2013-08-01 ENCOUNTER — Other Ambulatory Visit: Payer: Medicare Other

## 2013-08-03 ENCOUNTER — Other Ambulatory Visit (INDEPENDENT_AMBULATORY_CARE_PROVIDER_SITE_OTHER): Payer: Medicare Other

## 2013-08-03 DIAGNOSIS — E785 Hyperlipidemia, unspecified: Secondary | ICD-10-CM

## 2013-08-03 LAB — HEPATIC FUNCTION PANEL
ALK PHOS: 55 U/L (ref 39–117)
ALT: 17 U/L (ref 0–35)
AST: 21 U/L (ref 0–37)
Albumin: 4.1 g/dL (ref 3.5–5.2)
Bilirubin, Direct: 0.2 mg/dL (ref 0.0–0.3)
TOTAL PROTEIN: 7.7 g/dL (ref 6.0–8.3)
Total Bilirubin: 2.2 mg/dL — ABNORMAL HIGH (ref 0.3–1.2)

## 2013-08-03 LAB — LIPID PANEL
CHOL/HDL RATIO: 3
Cholesterol: 117 mg/dL (ref 0–200)
HDL: 46.6 mg/dL (ref >39.00)
LDL Cholesterol: 52 mg/dL (ref 0–99)
Triglycerides: 93 mg/dL (ref 0.0–149.0)
VLDL: 18.6 mg/dL (ref 0.0–40.0)

## 2013-10-02 LAB — HM MAMMOGRAPHY: HM Mammogram: NEGATIVE

## 2013-10-25 ENCOUNTER — Encounter: Payer: Self-pay | Admitting: Family Medicine

## 2014-05-17 ENCOUNTER — Other Ambulatory Visit (INDEPENDENT_AMBULATORY_CARE_PROVIDER_SITE_OTHER): Payer: Medicare Other

## 2014-05-17 DIAGNOSIS — Z0000 Encounter for general adult medical examination without abnormal findings: Secondary | ICD-10-CM

## 2014-05-17 DIAGNOSIS — M1990 Unspecified osteoarthritis, unspecified site: Secondary | ICD-10-CM

## 2014-05-17 DIAGNOSIS — E785 Hyperlipidemia, unspecified: Secondary | ICD-10-CM

## 2014-05-17 LAB — CBC WITH DIFFERENTIAL/PLATELET
BASOS ABS: 0 10*3/uL (ref 0.0–0.1)
Basophils Relative: 0.9 % (ref 0.0–3.0)
EOS ABS: 0.3 10*3/uL (ref 0.0–0.7)
Eosinophils Relative: 5.5 % — ABNORMAL HIGH (ref 0.0–5.0)
HCT: 37.5 % (ref 36.0–46.0)
Hemoglobin: 12.4 g/dL (ref 12.0–15.0)
Lymphocytes Relative: 34.2 % (ref 12.0–46.0)
Lymphs Abs: 1.7 10*3/uL (ref 0.7–4.0)
MCHC: 33 g/dL (ref 30.0–36.0)
MCV: 95.1 fl (ref 78.0–100.0)
MONOS PCT: 8.1 % (ref 3.0–12.0)
Monocytes Absolute: 0.4 10*3/uL (ref 0.1–1.0)
NEUTROS ABS: 2.5 10*3/uL (ref 1.4–7.7)
NEUTROS PCT: 51.3 % (ref 43.0–77.0)
PLATELETS: 213 10*3/uL (ref 150.0–400.0)
RBC: 3.94 Mil/uL (ref 3.87–5.11)
RDW: 13.8 % (ref 11.5–15.5)
WBC: 4.8 10*3/uL (ref 4.0–10.5)

## 2014-05-17 LAB — LIPID PANEL
CHOL/HDL RATIO: 5
Cholesterol: 236 mg/dL — ABNORMAL HIGH (ref 0–200)
HDL: 44.3 mg/dL (ref >39.00)
LDL Cholesterol: 161 mg/dL — ABNORMAL HIGH (ref 0–99)
NONHDL: 191.7
Triglycerides: 152 mg/dL — ABNORMAL HIGH (ref 0.0–149.0)
VLDL: 30.4 mg/dL (ref 0.0–40.0)

## 2014-05-17 LAB — POCT URINALYSIS DIPSTICK
Bilirubin, UA: NEGATIVE
GLUCOSE UA: NEGATIVE
Ketones, UA: NEGATIVE
Nitrite, UA: POSITIVE
Protein, UA: NEGATIVE
RBC UA: NEGATIVE
Spec Grav, UA: 1.015
UROBILINOGEN UA: 0.2
pH, UA: 7

## 2014-05-17 LAB — COMPREHENSIVE METABOLIC PANEL
ALT: 17 U/L (ref 0–35)
AST: 15 U/L (ref 0–37)
Albumin: 3.7 g/dL (ref 3.5–5.2)
Alkaline Phosphatase: 48 U/L (ref 39–117)
BUN: 18 mg/dL (ref 6–23)
CALCIUM: 9.3 mg/dL (ref 8.4–10.5)
CHLORIDE: 105 meq/L (ref 96–112)
CO2: 27 mEq/L (ref 19–32)
CREATININE: 0.9 mg/dL (ref 0.4–1.2)
GFR: 65.58 mL/min (ref >60.00)
Glucose, Bld: 101 mg/dL — ABNORMAL HIGH (ref 70–99)
POTASSIUM: 4.4 meq/L (ref 3.5–5.1)
SODIUM: 138 meq/L (ref 135–145)
TOTAL PROTEIN: 6.9 g/dL (ref 6.0–8.3)
Total Bilirubin: 1.1 mg/dL (ref 0.2–1.2)

## 2014-05-17 LAB — TSH: TSH: 8.58 u[IU]/mL — ABNORMAL HIGH (ref 0.35–4.50)

## 2014-05-21 ENCOUNTER — Ambulatory Visit (INDEPENDENT_AMBULATORY_CARE_PROVIDER_SITE_OTHER): Payer: Medicare Other | Admitting: Family Medicine

## 2014-05-21 ENCOUNTER — Encounter: Payer: Self-pay | Admitting: Family Medicine

## 2014-05-21 VITALS — BP 130/70 | HR 77 | Temp 98.3°F | Ht 67.0 in | Wt 141.0 lb

## 2014-05-21 DIAGNOSIS — Z0000 Encounter for general adult medical examination without abnormal findings: Secondary | ICD-10-CM

## 2014-05-21 DIAGNOSIS — Z23 Encounter for immunization: Secondary | ICD-10-CM

## 2014-05-21 DIAGNOSIS — E038 Other specified hypothyroidism: Secondary | ICD-10-CM

## 2014-05-21 MED ORDER — LEVOTHYROXINE SODIUM 25 MCG PO TABS: 25.0000 ug | ORAL_TABLET | Freq: Every day | ORAL | Status: DC

## 2014-05-21 NOTE — Progress Notes (Signed)
Pre visit review using our clinic review tool, if applicable. No additional management support is needed unless otherwise documented below in the visit note. 

## 2014-05-21 NOTE — Progress Notes (Signed)
Subjective:    Patient ID: Tara Harris, female    DOB: Jul 07, 1931, 80 y.o.   MRN: [id number]  HPI   Patient here for complete physical. Has history of hyperlipidemia and reportedly on Lipitor 20 mg once daily. She thinks she is taking this regularly. Her immunizations are up-to-date with the exception of no Prevnar 13. She already had flu vaccine. She had mammogram last February. Colonoscopy up-to-date. She takes calcium and vitamin D. No recent falls. No depression issues. Has had some thinning of hair recently. Occasional constipation. No cold intolerance. Weight is stable.  She has new problem of high TSH from recent labs and symptoms as above.  No major weight changes.  Past Medical History  Diagnosis Date  . Menopause   . IBS (irritable bowel syndrome)   . Arthritis   . Tubular adenoma 01/13/2007   Past Surgical History  Procedure Laterality Date  . Tonsillectomy    . Cataract extraction, bilateral  2012  . Colonoscopy    . Biopsy left leg      still has scab on it pt states    reports that she has never smoked. She has never used smokeless tobacco. She reports that she does not drink alcohol or use illicit drugs. family history includes Alzheimer's disease in her father and mother; Heart attack in her father and sister; Kidney disease in her brother. There is no history of Colon cancer or Stomach cancer. No Known Allergies    Review of Systems  Constitutional: Negative for fever, activity change, appetite change, fatigue and unexpected weight change.  HENT: Negative for ear pain, hearing loss, sore throat and trouble swallowing.   Eyes: Negative for visual disturbance.  Respiratory: Negative for cough and shortness of breath.   Cardiovascular: Negative for chest pain and palpitations.  Gastrointestinal: Negative for abdominal pain, diarrhea, constipation and blood in stool.  Genitourinary: Negative for dysuria and hematuria.  Musculoskeletal: Negative for myalgias,  back pain and arthralgias.  Skin: Negative for rash.  Neurological: Negative for dizziness, syncope and headaches.  Hematological: Negative for adenopathy.  Psychiatric/Behavioral: Negative for confusion and dysphoric mood.       Objective:   Physical Exam  Constitutional: She is oriented to person, place, and time. She appears well-developed and well-nourished.  HENT:  Head: Normocephalic and atraumatic.  Eyes: EOM are normal. Pupils are equal, round, and reactive to light.  Neck: Normal range of motion. Neck supple. No thyromegaly present.  Cardiovascular: Normal rate, regular rhythm and normal heart sounds.   No murmur heard. Pulmonary/Chest: Breath sounds normal. No respiratory distress. She has no wheezes. She has no rales.  Abdominal: Soft. Bowel sounds are normal. She exhibits no distension and no mass. There is no tenderness. There is no rebound and no guarding.  Musculoskeletal: Normal range of motion. She exhibits no edema.  Lymphadenopathy:    She has no cervical adenopathy.  Neurological: She is alert and oriented to person, place, and time. She displays normal reflexes. No cranial nerve deficit.  Skin: No rash noted.  Psychiatric: She has a normal mood and affect. Her behavior is normal. Judgment and thought content normal.          Assessment & Plan:  Complete physical. Labs reviewed. She has elevated TSH over 8 (hypothyroidism). She has had some occasional constipation and hair loss. Start levothyroxine 25 mg once daily and reassess in 3 months. Her lipids are also quite elevated. She does state she is taking Lipitor. Confirm that she  is taking this daily. Reassess lipids at follow-up in 3 months with thyroid replacement. Prevnar 13 given.

## 2014-06-14 ENCOUNTER — Other Ambulatory Visit: Payer: Self-pay | Admitting: Family Medicine

## 2014-08-20 ENCOUNTER — Ambulatory Visit (INDEPENDENT_AMBULATORY_CARE_PROVIDER_SITE_OTHER): Payer: Medicare Other | Admitting: Family Medicine

## 2014-08-20 ENCOUNTER — Encounter: Payer: Self-pay | Admitting: Family Medicine

## 2014-08-20 ENCOUNTER — Encounter: Payer: Self-pay | Admitting: *Deleted

## 2014-08-20 VITALS — BP 130/62 | HR 72 | Temp 97.4°F | Wt 139.0 lb

## 2014-08-20 DIAGNOSIS — E785 Hyperlipidemia, unspecified: Secondary | ICD-10-CM

## 2014-08-20 DIAGNOSIS — R252 Cramp and spasm: Secondary | ICD-10-CM | POA: Diagnosis not present

## 2014-08-20 DIAGNOSIS — R3915 Urgency of urination: Secondary | ICD-10-CM | POA: Diagnosis not present

## 2014-08-20 DIAGNOSIS — R05 Cough: Secondary | ICD-10-CM

## 2014-08-20 DIAGNOSIS — E038 Other specified hypothyroidism: Secondary | ICD-10-CM

## 2014-08-20 DIAGNOSIS — R058 Other specified cough: Secondary | ICD-10-CM

## 2014-08-20 LAB — LIPID PANEL
CHOL/HDL RATIO: 3
Cholesterol: 127 mg/dL (ref 0–200)
HDL: 41.7 mg/dL (ref >39.00)
LDL CALC: 66 mg/dL (ref 0–99)
NONHDL: 85.3
Triglycerides: 98 mg/dL (ref 0.0–149.0)
VLDL: 19.6 mg/dL (ref 0.0–40.0)

## 2014-08-20 LAB — HEPATIC FUNCTION PANEL
ALBUMIN: 4.2 g/dL (ref 3.5–5.2)
ALT: 13 U/L (ref 0–35)
AST: 15 U/L (ref 0–37)
Alkaline Phosphatase: 63 U/L (ref 39–117)
BILIRUBIN TOTAL: 1.3 mg/dL — AB (ref 0.2–1.2)
Bilirubin, Direct: 0.3 mg/dL (ref 0.0–0.3)
Total Protein: 7.5 g/dL (ref 6.0–8.3)

## 2014-08-20 LAB — BASIC METABOLIC PANEL
BUN: 29 mg/dL — AB (ref 6–23)
CHLORIDE: 105 meq/L (ref 96–112)
CO2: 30 meq/L (ref 19–32)
CREATININE: 0.88 mg/dL (ref 0.40–1.20)
Calcium: 10 mg/dL (ref 8.4–10.5)
GFR: 65.54 mL/min (ref >60.00)
Glucose, Bld: 104 mg/dL — ABNORMAL HIGH (ref 70–99)
Potassium: 4.2 mEq/L (ref 3.5–5.1)
Sodium: 141 mEq/L (ref 135–145)

## 2014-08-20 LAB — MAGNESIUM: Magnesium: 2.2 mg/dL (ref 1.5–2.5)

## 2014-08-20 LAB — TSH: TSH: 2.05 u[IU]/mL (ref 0.35–4.50)

## 2014-08-20 NOTE — Progress Notes (Signed)
Pre visit review using our clinic review tool, if applicable. No additional management support is needed unless otherwise documented below in the visit note. 

## 2014-08-20 NOTE — Progress Notes (Signed)
   Subjective:    Patient ID: Tara Harris, female    DOB: 12-04-1931, 81 y.o.   MRN: [id number]  HPI Patient is here with multiple issues which are addressed as follows  Recent hypothyroidism. We initiated levothyroxine 25 g once daily. She states she is taking this consistently. She is still having occasional constipation and occasional hair loss. Overall, her constipation is slightly better. No cold intolerance  Hyperlipidemia. Patient takes atorvastatin. No myalgias.  Frequent leg cramps. Bilateral involving thighs and calves. Frequently at night. She feels she is getting adequate fluid intake. No history of hypokalemia. No history of magnesium levels checked. No excessive caffeine use. No diuretic use.  Urinary urgency. No burning with urination. Minimal caffeine use. No obstructive urinary symptoms.  Dry cough for the past few weeks. No dyspnea. No hemoptysis. No pleuritic pain. No fevers or chills. Cough is minimal. No postnasal drip symptoms. No GERD symptoms.  Past Medical History  Diagnosis Date  . Menopause   . IBS (irritable bowel syndrome)   . Arthritis   . Tubular adenoma 01/13/2007   Past Surgical History  Procedure Laterality Date  . Tonsillectomy    . Cataract extraction, bilateral  2012  . Colonoscopy    . Biopsy left leg      still has scab on it pt states    reports that she has never smoked. She has never used smokeless tobacco. She reports that she does not drink alcohol or use illicit drugs. family history includes Alzheimer's disease in her father and mother; Heart attack in her father and sister; Kidney disease in her brother. There is no history of Colon cancer or Stomach cancer. No Known Allergies    Review of Systems  Constitutional: Negative for fever, chills, appetite change, fatigue and unexpected weight change.  HENT: Negative for trouble swallowing.   Eyes: Negative for visual disturbance.  Respiratory: Positive for cough. Negative for chest  tightness, shortness of breath and wheezing.   Cardiovascular: Negative for chest pain, palpitations and leg swelling.  Endocrine: Negative for polydipsia and polyuria.  Genitourinary: Positive for urgency. Negative for dysuria, hematuria and difficulty urinating.  Neurological: Negative for dizziness, seizures, syncope, weakness, light-headedness and headaches.       Objective:   Physical Exam  Constitutional: She is oriented to person, place, and time. She appears well-developed and well-nourished.  HENT:  Mouth/Throat: Oropharynx is clear and moist.  Neck: Neck supple. No thyromegaly present.  Cardiovascular: Normal rate and regular rhythm.   Pulmonary/Chest: Effort normal and breath sounds normal. No respiratory distress. She has no wheezes. She has no rales.  Musculoskeletal: She exhibits no edema.  Neurological: She is alert and oriented to person, place, and time.          Assessment & Plan:  #1 hypothyroidism. Recently initiated levothyroxin. Recheck TSH #2 hyperlipidemia. Check lipid and hepatic panel. Continue atorvastatin  #3 frequent leg cramps. Ensure adequate hydration. Check electrolytes including magnesium level #4 urinary urgency. She denies any infectious symptoms. Avoid caffeine use late in the day. Avoid excessive fluid at night. We discussed possible medication options that she is not interested at this time #5 dry cough. Question resolving viral process. No fevers or other concerns Touch base in 2 weeks if not resolving. Consider chest x-ray in 2 weeks if cough persist

## 2014-10-22 ENCOUNTER — Other Ambulatory Visit: Payer: Self-pay | Admitting: *Deleted

## 2014-10-22 MED ORDER — LEVOTHYROXINE SODIUM 25 MCG PO TABS: 25.0000 ug | ORAL_TABLET | Freq: Every day | ORAL | Status: DC

## 2014-10-22 MED ORDER — ATORVASTATIN CALCIUM 20 MG PO TABS: ORAL_TABLET | ORAL | Status: DC

## 2014-10-22 NOTE — Addendum Note (Signed)
Addended by: Townsend Roger D on: 10/22/2014 04:28 PM   Modules accepted: Orders

## 2014-11-06 ENCOUNTER — Encounter: Payer: Self-pay | Admitting: Family Medicine

## 2014-11-20 ENCOUNTER — Encounter: Payer: Self-pay | Admitting: Family Medicine

## 2014-11-20 ENCOUNTER — Ambulatory Visit (INDEPENDENT_AMBULATORY_CARE_PROVIDER_SITE_OTHER): Payer: Medicare Other | Admitting: Family Medicine

## 2014-11-20 VITALS — BP 130/70 | HR 69 | Temp 97.7°F | Wt 136.0 lb

## 2014-11-20 DIAGNOSIS — E785 Hyperlipidemia, unspecified: Secondary | ICD-10-CM | POA: Diagnosis not present

## 2014-11-20 DIAGNOSIS — N3941 Urge incontinence: Secondary | ICD-10-CM | POA: Diagnosis not present

## 2014-11-20 DIAGNOSIS — R252 Cramp and spasm: Secondary | ICD-10-CM | POA: Diagnosis not present

## 2014-11-20 DIAGNOSIS — R300 Dysuria: Secondary | ICD-10-CM | POA: Diagnosis not present

## 2014-11-20 LAB — POCT URINALYSIS DIPSTICK
BILIRUBIN UA: NEGATIVE
GLUCOSE UA: NEGATIVE
KETONES UA: NEGATIVE
Nitrite, UA: NEGATIVE
Protein, UA: NEGATIVE
SPEC GRAV UA: 1.02
Urobilinogen, UA: 0.2
pH, UA: 7

## 2014-11-20 MED ORDER — MIRABEGRON ER 25 MG PO TB24: 25.0000 mg | ORAL_TABLET | Freq: Every day | ORAL | Status: DC

## 2014-11-20 MED ORDER — CEPHALEXIN 500 MG PO CAPS: 500.0000 mg | ORAL_CAPSULE | Freq: Three times a day (TID) | ORAL | Status: DC

## 2014-11-20 NOTE — Progress Notes (Signed)
   Subjective:    Patient ID: Tara Harris, female    DOB: 1931-09-20, 81 y.o.   MRN: [id number]  HPI Patient seen today for following issues  Urine urgency. No stress incontinence. We discussed possible medication last visit she declined. She's also had for the past month some burning with urination. Never treated previously with medications for urgency. She does have some chronic constipation type issues. No history of glaucoma.  Hyperlipidemia. She takes Lipitor 20 mg daily. No myalgias. Hypothyroidism with recent initiation levothyroxine 25 g daily. Recent TSH normal.  Frequent leg cramps. Recent electrolytes including magnesium level normal. She has obtained relief with taking supplement with mustard. She stays well-hydrated  Past Medical History  Diagnosis Date  . Menopause   . IBS (irritable bowel syndrome)   . Arthritis   . Tubular adenoma 01/13/2007   Past Surgical History  Procedure Laterality Date  . Tonsillectomy    . Cataract extraction, bilateral  2012  . Colonoscopy    . Biopsy left leg      still has scab on it pt states    reports that she has never smoked. She has never used smokeless tobacco. She reports that she does not drink alcohol or use illicit drugs. family history includes Alzheimer's disease in her father and mother; Heart attack in her father and sister; Kidney disease in her brother. There is no history of Colon cancer or Stomach cancer. No Known Allergies    Review of Systems  Constitutional: Negative for appetite change, fatigue and unexpected weight change.  Eyes: Negative for visual disturbance.  Respiratory: Negative for cough, chest tightness, shortness of breath and wheezing.   Cardiovascular: Negative for chest pain, palpitations and leg swelling.  Genitourinary: Positive for dysuria and urgency. Negative for difficulty urinating.  Neurological: Negative for dizziness, seizures, syncope, weakness, light-headedness and headaches.         Objective:   Physical Exam  Constitutional: She appears well-developed and well-nourished.  Neck: Neck supple. No thyromegaly present.  Cardiovascular: Normal rate and regular rhythm.  Exam reveals no gallop.   Pulmonary/Chest: Effort normal and breath sounds normal. No respiratory distress. She has no wheezes. She has no rales.  Musculoskeletal: She exhibits no edema.  Psychiatric: She has a normal mood and affect. Her behavior is normal.          Assessment & Plan:  #1 urine urgency. She has some recent dysuria. Rule out UTI. If normal start Myrbetriq 25 mg once daily #2 bilateral leg cramps. Stay well-hydrated. Recent electrolytes normal. She is getting relief with over-the-counter mustard #3 hyperlipidemia. Recent lipids stable on atorvastatin #4 hypothyroidism. Recent TSH at goal. Repeat at follow-up in 6 months.  Urine dipstick reveals 2+ leukocytes. Urine culture sent. Keflex 500 mg 3 times a day pending culture results

## 2014-11-20 NOTE — Progress Notes (Signed)
Pre visit review using our clinic review tool, if applicable. No additional management support is needed unless otherwise documented below in the visit note. 

## 2014-11-23 LAB — URINE CULTURE

## 2015-01-08 ENCOUNTER — Encounter: Payer: Self-pay | Admitting: Internal Medicine

## 2015-01-27 ENCOUNTER — Telehealth: Payer: Self-pay | Admitting: Family Medicine

## 2015-01-27 MED ORDER — MIRABEGRON ER 25 MG PO TB24: 25.0000 mg | ORAL_TABLET | Freq: Every day | ORAL | Status: DC

## 2015-01-27 NOTE — Telephone Encounter (Signed)
Pt has no myrbetric and needs a refill [facility].

## 2015-01-27 NOTE — Telephone Encounter (Signed)
Rx sent to pharmacy   

## 2015-02-20 ENCOUNTER — Ambulatory Visit (INDEPENDENT_AMBULATORY_CARE_PROVIDER_SITE_OTHER): Payer: Medicare Other | Admitting: Family Medicine

## 2015-02-20 ENCOUNTER — Encounter: Payer: Self-pay | Admitting: Family Medicine

## 2015-02-20 VITALS — BP 140/62 | HR 71 | Temp 97.9°F | Ht 67.0 in | Wt 137.1 lb

## 2015-02-20 DIAGNOSIS — E039 Hypothyroidism, unspecified: Secondary | ICD-10-CM | POA: Insufficient documentation

## 2015-02-20 DIAGNOSIS — Z23 Encounter for immunization: Secondary | ICD-10-CM | POA: Diagnosis not present

## 2015-02-20 DIAGNOSIS — E038 Other specified hypothyroidism: Secondary | ICD-10-CM

## 2015-02-20 DIAGNOSIS — E785 Hyperlipidemia, unspecified: Secondary | ICD-10-CM | POA: Diagnosis not present

## 2015-02-20 NOTE — Progress Notes (Signed)
   Subjective:    Patient ID: Tara Harris, female    DOB: 1931/07/20, 81 y.o.   MRN: [id number]  HPI Medical follow-up. Hyperlipidemia started on Lipitor last year and tolerating well. Hypothyroidism on low-dose levothyroxine 25 g daily. She feels well with no fatigue issues. Stays very active with working around the house. No chest pains. No dizziness. No falls. Mood stable. Needs flu vaccine.  Past Medical History  Diagnosis Date  . Menopause   . IBS (irritable bowel syndrome)   . Arthritis   . Tubular adenoma 01/13/2007   Past Surgical History  Procedure Laterality Date  . Tonsillectomy    . Cataract extraction, bilateral  2012  . Colonoscopy    . Biopsy left leg      still has scab on it pt states    reports that she has never smoked. She has never used smokeless tobacco. She reports that she does not drink alcohol or use illicit drugs. family history includes Alzheimer's disease in her father and mother; Heart attack in her father and sister; Kidney disease in her brother. There is no history of Colon cancer or Stomach cancer. No Known Allergies    Review of Systems  Constitutional: Negative for fatigue.  Eyes: Negative for visual disturbance.  Respiratory: Negative for cough, chest tightness, shortness of breath and wheezing.   Cardiovascular: Negative for chest pain, palpitations and leg swelling.  Musculoskeletal: Negative for myalgias.  Neurological: Negative for dizziness, seizures, syncope, weakness, light-headedness and headaches.       Objective:   Physical Exam  Constitutional: She appears well-developed and well-nourished. No distress.  Neck: Neck supple. No thyromegaly present.  Cardiovascular: Normal rate and regular rhythm.   Pulmonary/Chest: Breath sounds normal. No respiratory distress. She has no wheezes. She has no rales.  Musculoskeletal: She exhibits no edema.          Assessment & Plan:  #1 hypothyroidism. Symptomatically stable. Recheck  TSH at follow-up in 6 months  #2 hyperlipidemia. Continue Lipitor. Check lipids at follow-up  #3 health maintenance. Flu vaccine given

## 2015-02-20 NOTE — Addendum Note (Signed)
Addended by: Ailene Rud E on: 02/20/2015 09:22 AM   Modules accepted: Orders, Medications

## 2015-02-20 NOTE — Progress Notes (Signed)
Pre visit review using our clinic review tool, if applicable. No additional management support is needed unless otherwise documented below in the visit note. 

## 2015-03-27 ENCOUNTER — Encounter: Payer: Self-pay | Admitting: Internal Medicine

## 2015-03-27 ENCOUNTER — Ambulatory Visit (INDEPENDENT_AMBULATORY_CARE_PROVIDER_SITE_OTHER): Payer: Medicare Other | Admitting: Internal Medicine

## 2015-03-27 VITALS — BP 130/70 | HR 74 | Temp 97.8°F | Resp 20 | Ht 67.0 in | Wt 133.6 lb

## 2015-03-27 DIAGNOSIS — R2681 Unsteadiness on feet: Secondary | ICD-10-CM

## 2015-03-27 DIAGNOSIS — R413 Other amnesia: Secondary | ICD-10-CM

## 2015-03-27 DIAGNOSIS — N3941 Urge incontinence: Secondary | ICD-10-CM | POA: Diagnosis not present

## 2015-03-27 DIAGNOSIS — E785 Hyperlipidemia, unspecified: Secondary | ICD-10-CM

## 2015-03-27 DIAGNOSIS — R739 Hyperglycemia, unspecified: Secondary | ICD-10-CM | POA: Diagnosis not present

## 2015-03-27 MED ORDER — VITAMIN D 1000 UNITS PO TABS: 1000.0000 [IU] | ORAL_TABLET | Freq: Every day | ORAL | Status: DC

## 2015-03-27 NOTE — Progress Notes (Signed)
Patient ID: Tara Harris, female   DOB: 1932/02/03, 81 y.o.   MRN: [id number]   Location: [facility] Provider: Rexene Edison. Mariea Clonts, D.O., C.M.D.  Code Status: DNR Goals of Care: Advanced Directive information Does patient have an advance directive?: No, Would patient like information on creating an advanced directive?: Yes - Educational materials givenpatient plans to modify her HCPOA with her attorney b/c her husband is listed as her primary HCPOA and he now has dementia and is unable to serve in that role  Chief Complaint  Patient presents with  . Establish Care  . Medical Management of Chronic Issues    HPI: Patient is a 81 y.o. female seen in the office today to establish with the practice.  She feels she is in good health.    Has h/o hypothyroidism: takes 66mcg of levothroid.  Wonders if she needs to be on it.  Has been on and off all her life and says her hair is falling out.  Checked 7 mos ago and normal.  Says she had just been put back on it.  Denies having symptoms at the time she was put back on medication.    IBS:  Denies recent bowel problems.  Does c/o of flatus.    Hyperlipidemia:  Last lipids were normal in March '16.  Allergic rhinitis:  Does have runny nose and cough occasionally, but nothing bothersome.    Was having urinary urgency and was leaking before she'd get there.  Now on myrbetriq.  Didn't get the medication for a couple of weeks and had no symptoms.  Did eventually get recurrence of symptoms and resume the medication.  Here lately, she feels like she's walking off to the side at times.  Staggers like she's been drinking and never did.  No dizziness, no vertigo.  No lightheadedness on standing.    Says she's not as sharp as she used to be in terms of memory.  Had housecalls--did not remember all three items this time.  Says she'll get confused and forgetful.      Last bone density was 2/12.  A few years ago she had difficulty with a crick in her  neck.  Went to a Restaurant manager, fast food who helped.  Was going once in a while.  Used to be 72ft 7and 3/4.  Had lost 2.75 inches when she had her bone density.  She is back to 5'7".    Review of Systems:  Review of Systems  Constitutional: Negative for fever, chills and malaise/fatigue.  HENT: Negative for congestion and hearing loss.        Runny nose  Eyes: Negative for blurred vision.       Sees well, goes to eye doctor once a year--does not need her glasses all of the time, but wears them  Respiratory: Positive for cough. Negative for shortness of breath.        Occasional  Cardiovascular: Negative for chest pain, palpitations and leg swelling.  Gastrointestinal: Positive for constipation. Negative for nausea, vomiting, abdominal pain, diarrhea, blood in stool and melena.       Flatus  Genitourinary: Positive for urgency. Negative for dysuria and frequency.       Urinary leakage when not on myrbetriq--ok while taking  Musculoskeletal: Negative for falls.  Skin: Negative for itching and rash.  Neurological: Negative for dizziness, tingling, sensory change, speech change, focal weakness, loss of consciousness, weakness and headaches.       Feels off balance at times when walking and  will walk crooked or bump into wall  Psychiatric/Behavioral: Positive for memory loss. Negative for depression and substance abuse. The patient is not nervous/anxious and does not have insomnia.     Past Medical History  Diagnosis Date  . Menopause   . IBS (irritable bowel syndrome)   . Arthritis   . Tubular adenoma 01/13/2007  . High cholesterol   . Thyroid disease     Past Surgical History  Procedure Laterality Date  . Tonsillectomy    . Cataract extraction, bilateral  2012  . Colonoscopy    . Biopsy left leg      still has scab on it pt states   Social History   Social History  . Marital Status: Married    Spouse Name: N/A  . Number of Children: N/A  . Years of Education: N/A   Occupational  History  . Not on file.   Social History Main Topics  . Smoking status: Never Smoker   . Smokeless tobacco: Never Used  . Alcohol Use: No  . Drug Use: No  . Sexual Activity: Not on file   Other Topics Concern  . Not on file   Social History Narrative   Diet:      Do you drink/ eat things with caffeine? Yes      Marital status:  Married                             What year were you married ? 1961      Do you live in a house, apartment,assistred living, condo, trailer, etc.)? House      Is it one or more stories? One story       How many persons live in your home ? Two      Do you have any pets in your home ?(please list) 1 Dog 1 Cat      Current or past profession:  [employer], Telephone Operator       Do you exercise?  Yes                            Type & how often: Garden and Yard work      Do you have a living will? Yes      Do you have a DNR form?   Yes                    If not, do you want to discuss one?       Do you have signed POA?HPOA forms?                 If so, please bring to your        appointment         Family History  Problem Relation Age of Onset  . Alzheimer's disease Mother   . Heart attack Father   . Alzheimer's disease Father   . Heart attack Sister   . Kidney disease Brother   . Colon cancer Neg Hx   . Stomach cancer Neg Hx     No Known Allergies    Medication List       This list is accurate as of: 03/27/15  9:35 AM.  Always use your most recent med list.               ALEVE PO  As needed  aspirin 81 MG tablet  Take 81 mg by mouth daily.     atorvastatin 20 MG tablet  Commonly known as:  LIPITOR  TAKE 1 TABLET (20 MG TOTAL) BY MOUTH DAILY.     CALCIUM 1000 + D PO  Take 2 capsules by mouth daily.     levothyroxine 25 MCG tablet  Commonly known as:  LEVOTHROID  Take 1 tablet (25 mcg total) by mouth daily before breakfast.     mirabegron ER 25 MG Tb24 tablet  Commonly known as:  MYRBETRIQ  Take 1 tablet  (25 mg total) by mouth daily.     multivitamin with minerals tablet  Take 1 tablet by mouth daily.     Omega-3 Fish Oil/Vitamin D3 1000-1000 MG-UNIT Caps  Take 1 capsule by mouth daily     polyethylene glycol packet  Commonly known as:  MIRALAX / GLYCOLAX  Take 17 g by mouth daily as needed.        Health Maintenance  Topic Date Due  . DEXA SCAN  07/26/1998  . MAMMOGRAM  10/25/2015  . INFLUENZA VACCINE  01/06/2016  . TETANUS/TDAP  12/27/2018  . ZOSTAVAX  Completed  . PNA vac Low Risk Adult  Completed    Physical Exam: Filed Vitals:   03/27/15 0857  BP: 130/70  Pulse: 74  Temp: 97.8 F (36.6 C)  TempSrc: Oral  Resp: 20  Height: 5\' 7"  (1.702 m)  Weight: 133 lb 9.6 oz (60.601 kg)  SpO2: 97%   Body mass index is 20.92 kg/(m^2). Physical Exam  Constitutional: She is oriented to person, place, and time. She appears well-developed and well-nourished. No distress.  HENT:  Head: Normocephalic and atraumatic.  Eyes:  glasses  Cardiovascular: Normal rate, regular rhythm, normal heart sounds and intact distal pulses.   Pulmonary/Chest: Effort normal and breath sounds normal. No respiratory distress.  Abdominal: Soft. Bowel sounds are normal.  Musculoskeletal: Normal range of motion.  Neurological: She is alert and oriented to person, place, and time.  Skin: Skin is warm and dry.  Psychiatric: She has a normal mood and affect. Her behavior is normal. Judgment and thought content normal.    Labs reviewed: Basic Metabolic Panel:  Recent Labs  05/17/14 0829 08/20/14 0914  NA 138 141  K 4.4 4.2  CL 105 105  CO2 27 30  GLUCOSE 101* 104*  BUN 18 29*  CREATININE 0.9 0.88  CALCIUM 9.3 10.0  MG  --  2.2  TSH 8.58* 2.05   Liver Function Tests:  Recent Labs  05/17/14 0829 08/20/14 0914  AST 15 15  ALT 17 13  ALKPHOS 48 63  BILITOT 1.1 1.3*  PROT 6.9 7.5  ALBUMIN 3.7 4.2   No results for input(s): LIPASE, AMYLASE in the last 8760 hours. No results for  input(s): AMMONIA in the last 8760 hours. CBC:  Recent Labs  05/17/14 0829  WBC 4.8  NEUTROABS 2.5  HGB 12.4  HCT 37.5  MCV 95.1  PLT 213.0   Lipid Panel:  Recent Labs  05/17/14 0829 08/20/14 0914  CHOL 236* 127  HDL 44.30 41.70  LDLCALC 161* 66  TRIG 152.0* 98.0  CHOLHDL 5 3    Assessment/Plan 1. Hyperglycemia - will reassess average - Hemoglobin A1c  2. Urge urinary incontinence -cont on myrbetriq 25mg  which is well-tolerated--?if balance is related, however - CBC with Differential/Platelet - Comprehensive metabolic panel  3. Memory loss - f/u MMSE at annual exam in 3 mos along with thorough neuro exam  -  check related labs: - TSH - B12 and Folate Panel  4. Unsteady gait - cont vitamin D and will need bone density in near future (will order at her annual exam when update health maintenance) - cholecalciferol (VITAMIN D) 1000 UNITS tablet; Take 1 tablet (1,000 Units total) by mouth daily.  Dispense: 30 tablet; Refill: 3 - B12 and Folate Panel  5. Hyperlipidemia -tolerating atorvastatin well and last lipids were at goal, will recheck and then plan on annual lipids only if at goal again this time - Lipid panel  Labs/tests ordered:   Orders Placed This Encounter  Procedures  . CBC with Differential/Platelet  . Comprehensive metabolic panel    Order Specific Question:  Has the patient fasted?    Answer:  Yes  . Hemoglobin A1c  . Lipid panel    Order Specific Question:  Has the patient fasted?    Answer:  Yes  . TSH  . B12 and Folate Panel    Next appt:  3 mos for annual wellness with mmse and physical  Erron Wengert L. Dequane Strahan, D.O. [facility] [address], [place] [address] Cell Phone (Mon-Fri 8am-5pm):  [phone] On Call:  [phone] & follow prompts after 5pm & weekends Office Phone:  [phone] Office Fax:  [phone]

## 2015-03-28 LAB — CBC WITH DIFFERENTIAL/PLATELET
Basophils Absolute: 0 10*3/uL (ref 0.0–0.2)
Basos: 1 %
EOS (ABSOLUTE): 0.2 10*3/uL (ref 0.0–0.4)
Eos: 3 %
Hematocrit: 36.6 % (ref 34.0–46.6)
Hemoglobin: 12.4 g/dL (ref 11.1–15.9)
Immature Grans (Abs): 0 10*3/uL (ref 0.0–0.1)
Immature Granulocytes: 0 %
Lymphocytes Absolute: 1.6 10*3/uL (ref 0.7–3.1)
Lymphs: 28 %
MCH: 31.6 pg (ref 26.6–33.0)
MCHC: 33.9 g/dL (ref 31.5–35.7)
MCV: 93 fL (ref 79–97)
Monocytes Absolute: 0.5 10*3/uL (ref 0.1–0.9)
Monocytes: 9 %
Neutrophils Absolute: 3.4 10*3/uL (ref 1.4–7.0)
Neutrophils: 59 %
Platelets: 234 10*3/uL (ref 150–379)
RBC: 3.92 x10E6/uL (ref 3.77–5.28)
RDW: 13.3 % (ref 12.3–15.4)
WBC: 5.7 10*3/uL (ref 3.4–10.8)

## 2015-03-28 LAB — COMPREHENSIVE METABOLIC PANEL
ALT: 17 IU/L (ref 0–32)
AST: 19 IU/L (ref 0–40)
Albumin/Globulin Ratio: 1.7 (ref 1.1–2.5)
Albumin: 4.4 g/dL (ref 3.5–4.7)
Alkaline Phosphatase: 65 IU/L (ref 39–117)
BUN/Creatinine Ratio: 21 (ref 11–26)
BUN: 19 mg/dL (ref 8–27)
Bilirubin Total: 1.4 mg/dL — ABNORMAL HIGH (ref 0.0–1.2)
CO2: 26 mmol/L (ref 18–29)
Calcium: 9.5 mg/dL (ref 8.7–10.3)
Chloride: 102 mmol/L (ref 97–106)
Creatinine, Ser: 0.89 mg/dL (ref 0.57–1.00)
GFR calc Af Amer: 70 mL/min/{1.73_m2} (ref >59)
GFR calc non Af Amer: 61 mL/min/{1.73_m2} (ref >59)
Globulin, Total: 2.6 g/dL (ref 1.5–4.5)
Glucose: 98 mg/dL (ref 65–99)
Potassium: 4.3 mmol/L (ref 3.5–5.2)
Sodium: 141 mmol/L (ref 136–144)
Total Protein: 7 g/dL (ref 6.0–8.5)

## 2015-03-28 LAB — LIPID PANEL
Chol/HDL Ratio: 2.5 ratio units (ref 0.0–4.4)
Cholesterol, Total: 126 mg/dL (ref 100–199)
HDL: 50 mg/dL (ref >39)
LDL Calculated: 56 mg/dL (ref 0–99)
Triglycerides: 100 mg/dL (ref 0–149)
VLDL Cholesterol Cal: 20 mg/dL (ref 5–40)

## 2015-03-28 LAB — B12 AND FOLATE PANEL
Folate: >20 ng/mL (ref >3.0)
Vitamin B-12: 817 pg/mL (ref 211–946)

## 2015-03-28 LAB — TSH: TSH: 2.84 u[IU]/mL (ref 0.450–4.500)

## 2015-03-28 LAB — HEMOGLOBIN A1C
Est. average glucose Bld gHb Est-mCnc: 123 mg/dL
Hgb A1c MFr Bld: 5.9 % — ABNORMAL HIGH (ref 4.8–5.6)

## 2015-07-03 ENCOUNTER — Ambulatory Visit (INDEPENDENT_AMBULATORY_CARE_PROVIDER_SITE_OTHER): Payer: Medicare Other | Admitting: Internal Medicine

## 2015-07-03 ENCOUNTER — Encounter: Payer: Self-pay | Admitting: Internal Medicine

## 2015-07-03 VITALS — BP 120/70 | HR 70 | Temp 98.1°F | Resp 20 | Ht 67.0 in | Wt 134.8 lb

## 2015-07-03 DIAGNOSIS — E038 Other specified hypothyroidism: Secondary | ICD-10-CM

## 2015-07-03 DIAGNOSIS — G4762 Sleep related leg cramps: Secondary | ICD-10-CM | POA: Diagnosis not present

## 2015-07-03 DIAGNOSIS — E785 Hyperlipidemia, unspecified: Secondary | ICD-10-CM

## 2015-07-03 DIAGNOSIS — Z780 Asymptomatic menopausal state: Secondary | ICD-10-CM | POA: Diagnosis not present

## 2015-07-03 DIAGNOSIS — R739 Hyperglycemia, unspecified: Secondary | ICD-10-CM | POA: Diagnosis not present

## 2015-07-03 DIAGNOSIS — E034 Atrophy of thyroid (acquired): Secondary | ICD-10-CM | POA: Diagnosis not present

## 2015-07-03 DIAGNOSIS — N3941 Urge incontinence: Secondary | ICD-10-CM | POA: Diagnosis not present

## 2015-07-03 DIAGNOSIS — Z0000 Encounter for general adult medical examination without abnormal findings: Secondary | ICD-10-CM

## 2015-07-03 NOTE — Progress Notes (Signed)
Patient ID: Tara Harris, female   DOB: 05/05/1932, 82 y.o.   MRN: [id number]   Location: [facility]  Provider: Rexene Edison. Mariea Clonts, D.O., C.M.D.  Code Status: DNR Goals of Care: Advanced Directive information Does patient have an advance directive?: Yes, Type of Advance Directive: Living will;Healthcare Power of Attorney, Does patient want to make changes to advanced directive?: No - Patient declined  Chief Complaint  Patient presents with  . Annual Exam    Annual Exam    HPI: Patient is a 82 y.o. female seen in the office today for an annual wellness exam.  She feels good, most of the time.    Depression screen [facility] 2/9 07/03/2015 03/27/2015 08/20/2014 05/22/2013 05/21/2013  Decreased Interest 0 0 0 0 0  Down, Depressed, Hopeless 0 0 0 0 0  PHQ - 2 Score 0 0 0 0 0    Fall Risk  07/03/2015 03/27/2015 08/20/2014 05/22/2013 05/21/2013  Falls in the past year? No No No No No   MMSE - Mini Mental State Exam 07/03/2015  Not completed: (No Data)  Orientation to time 5  Orientation to Place 5  Registration 3  Attention/ Calculation 4  Recall 3  Language- name 2 objects 2  Language- repeat 1  Language- follow 3 step command 3  Language- read & follow direction 1  Write a sentence 1  Copy design 0  Total score 28  passed the clock   Health Maintenance  Topic Date Due  . DEXA SCAN  07/26/1998  . MAMMOGRAM  10/25/2015  . INFLUENZA VACCINE  01/06/2016  . TETANUS/TDAP  12/27/2018  . ZOSTAVAX  Completed  . PNA vac Low Risk Adult  Completed   Urinary incontinence?  Occasionally has had rare tiny leakage, wears a small pad when out somewhere. Functional status?  Independent.  Drives.  Takes care of husband.  She is stressed with her husband who refused to get his abscessed tooth out.  His dementia is worsening.   Exercise?  Does not exercise specifically, but does mow the yard, trim bushes, etc.  Is very active. Diet?  Does not follow a special diet.  Eats well. Vision:   Seeing ok, but not what it used to be.  Sees ophtho in April--Dr. Gershon Crane.  Reviewed results from today's eye chart. Hearing:  She hears well.  Her husband keeps the TV too loud.   Dentition:  No difficulty here.  Sees dentist regularly. Had her annual derm appt yesterday with Dr. Allyson Sabal and she had no signs of cancer. Pain:  No pain except rarely in left index finger--passing shooting pain through it.    Says every time she's been on thyroid medication, her hair comes out.  Can shampoo her hair and a bunch comes out and then comb or brush it and more comes out.  Last TSH was therapeutic.    Does get some cramping in her right ankle--happens almost nightly, rubs it and it goes away.  Takes a teaspoon of mustard.  Discussed electrolytes or statin possibly being cause.    Review of Systems:  Review of Systems  Constitutional: Negative for fever, chills, weight loss and malaise/fatigue.  HENT: Negative for congestion and hearing loss.   Eyes: Positive for blurred vision.       Not as good as it used to be  Respiratory: Negative for shortness of breath.   Cardiovascular: Negative for chest pain and leg swelling.  Gastrointestinal: Negative for abdominal pain, constipation, blood in stool  and melena.  Genitourinary: Positive for urgency. Negative for dysuria and frequency.  Musculoskeletal: Positive for myalgias. Negative for falls.       Leg cramps  Skin: Negative for rash.  Neurological: Negative for dizziness, loss of consciousness and weakness.  Endo/Heme/Allergies: Does not bruise/bleed easily.  Psychiatric/Behavioral: Negative for depression and memory loss. The patient is not nervous/anxious and does not have insomnia.     Past Medical History  Diagnosis Date  . Menopause   . IBS (irritable bowel syndrome)   . Arthritis   . Tubular adenoma 01/13/2007  . High cholesterol   . Thyroid disease     Past Surgical History  Procedure Laterality Date  . Tonsillectomy    . Cataract  extraction, bilateral  2012  . Colonoscopy    . Biopsy left leg      still has scab on it pt states    No Known Allergies    Medication List       This list is accurate as of: 07/03/15  9:40 AM.  Always use your most recent med list.               ALEVE PO  As needed     aspirin 81 MG tablet  Take 81 mg by mouth daily.     atorvastatin 20 MG tablet  Commonly known as:  LIPITOR  TAKE 1 TABLET (20 MG TOTAL) BY MOUTH DAILY.     CALCIUM 1000 + D PO  Take 2 capsules by mouth daily.     cholecalciferol 1000 units tablet  Commonly known as:  VITAMIN D  Take 1 tablet (1,000 Units total) by mouth daily.     levothyroxine 25 MCG tablet  Commonly known as:  LEVOTHROID  Take 1 tablet (25 mcg total) by mouth daily before breakfast.     mirabegron ER 25 MG Tb24 tablet  Commonly known as:  MYRBETRIQ  Take 1 tablet (25 mg total) by mouth daily.     multivitamin with minerals tablet  Take 1 tablet by mouth daily.     polyethylene glycol packet  Commonly known as:  MIRALAX / GLYCOLAX  Take 17 g by mouth daily as needed.        Physical Exam: Filed Vitals:   07/03/15 0912  BP: 120/70  Pulse: 70  Temp: 98.1 F (36.7 C)  TempSrc: Oral  Resp: 20  Height: 5\' 7"  (1.702 m)  Weight: 134 lb 12.8 oz (61.145 kg)  SpO2: 98%   Body mass index is 21.11 kg/(m^2). Physical Exam  Constitutional: She is oriented to person, place, and time. She appears well-developed and well-nourished. No distress.  HENT:  Head: Normocephalic and atraumatic.  Right Ear: External ear normal.  Left Ear: External ear normal.  Nose: Nose normal.  Mouth/Throat: Oropharynx is clear and moist. No oropharyngeal exudate.  Small ear canals  Eyes: Conjunctivae and EOM are normal. Pupils are equal, round, and reactive to light.  glasses  Neck: Normal range of motion. Neck supple. No JVD present. No tracheal deviation present. No thyromegaly present.  Cardiovascular: Normal rate, regular rhythm and  intact distal pulses.   Murmur heard. Pulmonary/Chest: Effort normal and breath sounds normal. No respiratory distress. Right breast exhibits no inverted nipple, no mass, no nipple discharge, no skin change and no tenderness. Left breast exhibits no inverted nipple, no mass, no nipple discharge, no skin change and no tenderness.  Abdominal: Soft. Bowel sounds are normal. She exhibits no distension and no mass. There  is no tenderness.  Musculoskeletal: Normal range of motion. She exhibits no edema or tenderness.  Lymphadenopathy:    She has no cervical adenopathy.  Neurological: She is alert and oriented to person, place, and time. She has normal reflexes. No cranial nerve deficit. She exhibits normal muscle tone.  Skin: Skin is warm and dry.  Multiple actinic keratoses of her back  Psychiatric: She has a normal mood and affect. Her behavior is normal. Judgment and thought content normal.    Labs reviewed: Basic Metabolic Panel:  Recent Labs  08/20/14 0914 03/27/15 1015  NA 141 141  K 4.2 4.3  CL 105 102  CO2 30 26  GLUCOSE 104* 98  BUN 29* 19  CREATININE 0.88 0.89  CALCIUM 10.0 9.5  MG 2.2  --   TSH 2.05 2.840   Liver Function Tests:  Recent Labs  08/20/14 0914 03/27/15 1015  AST 15 19  ALT 13 17  ALKPHOS 63 65  BILITOT 1.3* 1.4*  PROT 7.5 7.0  ALBUMIN 4.2 4.4   No results for input(s): LIPASE, AMYLASE in the last 8760 hours. No results for input(s): AMMONIA in the last 8760 hours. CBC:  Recent Labs  03/27/15 1015  WBC 5.7  NEUTROABS 3.4  HCT 36.6  MCV 93  PLT 234   Lipid Panel:  Recent Labs  08/20/14 0914 03/27/15 1015  CHOL 127 126  HDL 41.70 50  LDLCALC 66 56  TRIG 98.0 100  CHOLHDL 3 2.5   Lab Results  Component Value Date   HGBA1C 5.9* 03/27/2015    Procedures: EKG today:  NSR at 68, no acute ischemia or infarct  Assessment/Plan 1. Annual physical exam -bone density is overdue (need copy of last one done at [facility] several years  ago)--is on ca and vitamin D, never other meds, says she lost some height -otherwise up to date on vaccinations  2. Hyperlipidemia - cont statin therapy--discussed eating a banana or co q 10 for cramps due to this--is using mustard with benefit - EKG 12-Lead  3. Urge urinary incontinence -minor at this point, continues myrbetriq 25mg   4. Hyperglycemia -cont to be active and try to eat fewer starchy carbs and sweets F/u before next appt  5. Hypothyroidism due to acquired atrophy of thyroid -cont synthroid 74mcg daily first thing in am on empty stomach separate from other meds -recent return of hot flashes and increased hair loss so reassess tsh today - TSH  6. Nocturnal leg cramps -discussed eating a banana or co q 10 for cramps due to this--is using mustard with benefit  7. Postmenopausal estrogen deficiency - needs bone density; recent return of hot flashes - DG Bone Density; Future--need old report also  Labs/tests ordered:   Orders Placed This Encounter  Procedures  . DG Bone Density    Standing Status: Future     Number of Occurrences:      Standing Expiration Date: 09/01/2016    Scheduling Instructions:     [facility]; also please request previous results from [facility]    Order Specific Question:  Reason for Exam (SYMPTOM  OR DIAGNOSIS REQUIRED)    Answer:  postmenopausal estrogen deficiency, on calcium and vitamin D    Order Specific Question:  Preferred imaging location?    Answer:  External  . TSH  . CBC with Differential/Platelet    Standing Status: Future     Number of Occurrences:      Standing Expiration Date: 07/02/2016  . Comprehensive metabolic panel  Standing Status: Future     Number of Occurrences:      Standing Expiration Date: 07/02/2016    Order Specific Question:  Has the patient fasted?    Answer:  Yes  . Hemoglobin A1c    Standing Status: Future     Number of Occurrences:      Standing Expiration Date: 07/02/2016  . Lipid panel    Standing Status:  Future     Number of Occurrences:      Standing Expiration Date: 07/02/2016    Order Specific Question:  Has the patient fasted?    Answer:  Yes  . TSH    Standing Status: Future     Number of Occurrences:      Standing Expiration Date: 07/02/2016  . EKG 12-Lead    Next appt:  6 mos med mgt labs before  Tessla Spurling L. Jaegar Croft, D.O. [facility] [address], [place] [address] Cell Phone (Mon-Fri 8am-5pm):  [phone] On Call:  [phone] & follow prompts after 5pm & weekends Office Phone:  [phone] Office Fax:  [phone]

## 2015-07-04 LAB — TSH: TSH: 3.14 u[IU]/mL (ref 0.450–4.500)

## 2015-07-11 LAB — HM DEXA SCAN

## 2015-07-15 ENCOUNTER — Telehealth: Payer: Self-pay

## 2015-07-15 NOTE — Telephone Encounter (Signed)
Spoke with patient, Per Dr.Reed: We are collaborating with a aging gracefully in place project, this is a no cost home visits from the study occupational therapies and study nurse that are intended to help improve you physical abilities.   Patient agreed, patient was given phone number and contact name: Raynelle Fanning [facility] @ [phone]. Patient's demographics was faxed to [phone]

## 2015-07-16 ENCOUNTER — Encounter: Payer: Self-pay | Admitting: *Deleted

## 2015-07-21 ENCOUNTER — Telehealth: Payer: Self-pay

## 2015-07-21 NOTE — Telephone Encounter (Signed)
Spoke with patient about bone density results. She lifts 20lb bags of bird seed twice a week. Suggested getting 5 lb (cans) and do arm lifts couple of times a week also. She was ok with that.

## 2015-08-15 ENCOUNTER — Telehealth: Payer: Self-pay

## 2015-08-15 DIAGNOSIS — J069 Acute upper respiratory infection, unspecified: Secondary | ICD-10-CM

## 2015-08-15 MED ORDER — AMOXICILLIN-POT CLAVULANATE 875-125 MG PO TABS: 1.0000 | ORAL_TABLET | Freq: Two times a day (BID) | ORAL | Status: DC

## 2015-08-15 NOTE — Telephone Encounter (Signed)
Sent Augmentin antibiotic into [facility] on [address].  I would like her to also continue to drink plenty of water (at least 6 8oz glasses per day) and eat yogurt daily while on the antibiotic. She should call back if she's not better by midweek next week.

## 2015-08-15 NOTE — Telephone Encounter (Signed)
Left message on voicemail informing patient rx sent in and gave instructions about drinking water and daily yogurt.

## 2015-08-15 NOTE — Telephone Encounter (Signed)
Patient with runny nose, cough (dry), pain in ribs associated with cough, drainage (yellowish), low energy. Patient denies fever. Patient with symptoms x 2 weeks. Patient tried loratidine.  No available appointments, please advise

## 2015-08-20 ENCOUNTER — Telehealth: Payer: Self-pay

## 2015-08-20 DIAGNOSIS — R059 Cough, unspecified: Secondary | ICD-10-CM

## 2015-08-20 DIAGNOSIS — R05 Cough: Secondary | ICD-10-CM

## 2015-08-20 NOTE — Telephone Encounter (Signed)
Send for CXR to eval for cough. Continue home therapy. Follow up as scheduled

## 2015-08-20 NOTE — Telephone Encounter (Signed)
Dr. Mariea Clonts wanted her to call in today to report how she was doing. On 08/15/15 was started on Augmentin, feeling some better, no energy, she gets up to fix breakfast then goes back to bed, gets up to fix lunch for her and her husband then goes back to bed. She is drinking fluids and eating the yogurt. The worse thing is the cough when she lays down. Taking Mucinex . Forward note to Dr. Eulas Post, covering for Dr. Mariea Clonts.

## 2015-08-21 ENCOUNTER — Ambulatory Visit
Admission: RE | Admit: 2015-08-21 | Discharge: 2015-08-21 | Disposition: A | Discharge status: Home / Self Care | Payer: Medicare Other | Source: Ambulatory Visit | Attending: Internal Medicine | Admitting: Internal Medicine

## 2015-08-21 DIAGNOSIS — R059 Cough, unspecified: Secondary | ICD-10-CM

## 2015-08-21 DIAGNOSIS — R05 Cough: Secondary | ICD-10-CM

## 2015-08-21 NOTE — Telephone Encounter (Signed)
Spoke with Tara Harris she will go to [facility] for CXR, after she brings her husband in to see [facility] today. Put order in for CXR-cough

## 2015-08-22 ENCOUNTER — Telehealth: Payer: Self-pay

## 2015-08-22 MED ORDER — MOXIFLOXACIN HCL 400 MG PO TABS: 400.0000 mg | ORAL_TABLET | Freq: Every day | ORAL | Status: DC

## 2015-08-22 NOTE — Telephone Encounter (Signed)
-----   Message from [place], [place] sent at 08/21/2015  7:31 PM EDT ----- Odette Horns shows possible pneumonia. Finish augmentin. Start Avelox 400mg  #7 take 1 po daily no RF. Continue yogurt. Recommend she f/u in office if no better. Go to the ER if she gets worse

## 2015-08-22 NOTE — Telephone Encounter (Signed)
Discussed with patient, patieint verbalized understanding of results. RX sent to the pharmacy

## 2015-09-06 DEATH — deceased

## 2015-09-30 ENCOUNTER — Other Ambulatory Visit: Payer: Self-pay | Admitting: Family Medicine

## 2015-10-01 ENCOUNTER — Other Ambulatory Visit: Payer: Self-pay | Admitting: Family Medicine

## 2015-10-17 ENCOUNTER — Other Ambulatory Visit: Payer: Self-pay | Admitting: Family Medicine

## 2015-12-29 ENCOUNTER — Other Ambulatory Visit: Payer: TRICARE For Life (TFL)

## 2015-12-29 NOTE — Addendum Note (Signed)
Addended by: Rafael Bihari A on: 12/29/2015 08:22 AM   Modules accepted: Orders

## 2015-12-30 ENCOUNTER — Other Ambulatory Visit: Payer: TRICARE For Life (TFL)

## 2015-12-30 LAB — CBC WITH DIFFERENTIAL/PLATELET
Basophils Absolute: 55 cells/uL (ref 0–200)
Basophils Relative: 1 %
Eosinophils Absolute: 385 cells/uL (ref 15–500)
Eosinophils Relative: 7 %
HCT: 35.9 % (ref 35.0–45.0)
Hemoglobin: 12.1 g/dL (ref 11.7–15.5)
Lymphocytes Relative: 27 %
Lymphs Abs: 1485 cells/uL (ref 850–3900)
MCH: 31.2 pg (ref 27.0–33.0)
MCHC: 33.7 g/dL (ref 32.0–36.0)
MCV: 92.5 fL (ref 80.0–100.0)
MPV: 9.8 fL (ref 7.5–12.5)
Monocytes Absolute: 550 cells/uL (ref 200–950)
Monocytes Relative: 10 %
Neutro Abs: 3025 cells/uL (ref 1500–7800)
Neutrophils Relative %: 55 %
Platelets: 243 10*3/uL (ref 140–400)
RBC: 3.88 MIL/uL (ref 3.80–5.10)
RDW: 13.4 % (ref 11.0–15.0)
WBC: 5.5 10*3/uL (ref 3.8–10.8)

## 2015-12-30 LAB — COMPREHENSIVE METABOLIC PANEL
ALT: 17 U/L (ref 6–29)
AST: 25 U/L (ref 10–35)
Albumin: 3.9 g/dL (ref 3.6–5.1)
Alkaline Phosphatase: 58 U/L (ref 33–130)
BUN: 15 mg/dL (ref 7–25)
CO2: 29 mmol/L (ref 20–31)
Calcium: 9.3 mg/dL (ref 8.6–10.4)
Chloride: 105 mmol/L (ref 98–110)
Creat: 0.87 mg/dL (ref 0.60–0.88)
Glucose, Bld: 97 mg/dL (ref 65–99)
Potassium: 4.4 mmol/L (ref 3.5–5.3)
Sodium: 141 mmol/L (ref 135–146)
Total Bilirubin: 1.3 mg/dL — ABNORMAL HIGH (ref 0.2–1.2)
Total Protein: 7.3 g/dL (ref 6.1–8.1)

## 2015-12-30 LAB — LIPID PANEL
Cholesterol: 128 mg/dL (ref 125–200)
HDL: 55 mg/dL (ref >=46)
LDL Cholesterol: 55 mg/dL (ref <130)
Total CHOL/HDL Ratio: 2.3 Ratio (ref <=5.0)
Triglycerides: 91 mg/dL (ref <150)
VLDL: 18 mg/dL (ref <30)

## 2015-12-30 LAB — TSH: TSH: 4.15 mIU/L

## 2015-12-31 LAB — HEMOGLOBIN A1C
Hgb A1c MFr Bld: 6.2 % — ABNORMAL HIGH (ref <5.7)
Mean Plasma Glucose: 131 mg/dL

## 2016-01-01 ENCOUNTER — Encounter: Payer: Self-pay | Admitting: Internal Medicine

## 2016-01-01 ENCOUNTER — Ambulatory Visit (INDEPENDENT_AMBULATORY_CARE_PROVIDER_SITE_OTHER): Payer: Medicare Other | Admitting: Internal Medicine

## 2016-01-01 ENCOUNTER — Encounter: Payer: Self-pay | Admitting: *Deleted

## 2016-01-01 VITALS — BP 122/70 | HR 75 | Temp 98.2°F | Ht 67.0 in | Wt 133.0 lb

## 2016-01-01 DIAGNOSIS — E038 Other specified hypothyroidism: Secondary | ICD-10-CM | POA: Diagnosis not present

## 2016-01-01 DIAGNOSIS — N3941 Urge incontinence: Secondary | ICD-10-CM | POA: Diagnosis not present

## 2016-01-01 DIAGNOSIS — Z636 Dependent relative needing care at home: Secondary | ICD-10-CM

## 2016-01-01 DIAGNOSIS — E785 Hyperlipidemia, unspecified: Secondary | ICD-10-CM

## 2016-01-01 DIAGNOSIS — R252 Cramp and spasm: Secondary | ICD-10-CM

## 2016-01-01 DIAGNOSIS — R739 Hyperglycemia, unspecified: Secondary | ICD-10-CM | POA: Diagnosis not present

## 2016-01-01 DIAGNOSIS — E034 Atrophy of thyroid (acquired): Secondary | ICD-10-CM | POA: Diagnosis not present

## 2016-01-01 MED ORDER — ATORVASTATIN CALCIUM 10 MG PO TABS: 10.0000 mg | ORAL_TABLET | Freq: Every day | ORAL | 3 refills | Status: DC

## 2016-01-01 NOTE — Patient Instructions (Addendum)
Cut your lipitor pills in half until you use up what you have.  Hopefully, this will help your cramps.   Cut back on sweets.

## 2016-01-01 NOTE — Progress Notes (Signed)
Location:  [facility] clinic Provider:  Chesnie Capell L. Mariea Clonts, D.O., C.M.D.  Code Status: DNR Goals of Care:  Advanced Directives 01/01/2016  Does patient have an advance directive? No  Type of Advance Directive -  Does patient want to make changes to advanced directive? -  Copy of advanced directive(s) in chart? -  Would patient like information on creating an advanced directive? No - patient declined information     Chief Complaint  Patient presents with  . Medical Management of Chronic Issues    35mth follow-up    HPI: Patient is a 82 y.o. female seen today for medical management of chronic diseases.  She is doing great.    Hands are cramping more than they used to and legs are cramping.  Got some so bad at her hips that she could hardly walk.  Legs are at night.  Hands are anytime.  She ate a huge spoon of mustard and that worked after a little while.  She's been munching on candy lately more than usual.    Caregiver stress:  They went to [employer] for a hot dog.  Her husband made a big mess (he has dementia).  Hot dog contents all over the table.  Then he had a mess with ice cream also in a cone.  She was embarrassed.    OAB:  Says she has spurts of it.  Not needing a pad right now, but next week she might need it.  Thinks maybe she strains to pick something up.  Twice a month she carries 40lbs of birdseed.    Bowels are like usual.  Has never had a lot of bms.  Once in a while will use the miralax if she goes several days w/o one.    Past Medical History:  Diagnosis Date  . Arthritis   . High cholesterol   . IBS (irritable bowel syndrome)   . Menopause   . Thyroid disease   . Tubular adenoma 01/13/2007    Past Surgical History:  Procedure Laterality Date  . biopsy left leg     still has scab on it pt states  . CATARACT EXTRACTION, BILATERAL  2012  . COLONOSCOPY    . TONSILLECTOMY      No Known Allergies    Medication List       Accurate as of 01/01/16 10:41 AM. Always  use your most recent med list.          aspirin 81 MG tablet Take 81 mg by mouth daily.   atorvastatin 20 MG tablet Commonly known as:  LIPITOR TAKE 1 TABLET DAILY   CALCIUM 1000 + D PO Take 2 capsules by mouth daily.   cholecalciferol 1000 units tablet Commonly known as:  VITAMIN D Take 1 tablet (1,000 Units total) by mouth daily.   levothyroxine 25 MCG tablet Commonly known as:  SYNTHROID, LEVOTHROID TAKE 1 TABLET DAILY BEFORE BREAKFAST   moxifloxacin 400 MG tablet Commonly known as:  AVELOX Take 1 tablet (400 mg total) by mouth daily.   multivitamin with minerals tablet Take 1 tablet by mouth daily.   MYRBETRIQ 25 MG Tb24 tablet Generic drug:  mirabegron ER TAKE 1 TABLET DAILY   polyethylene glycol packet Commonly known as:  MIRALAX / GLYCOLAX Take 17 g by mouth daily as needed.       Review of Systems:  Review of Systems  Constitutional: Negative for chills, fever, malaise/fatigue and weight loss.  HENT: Negative for congestion.   Eyes:  Negative for blurred vision.  Respiratory: Negative for cough and shortness of breath.   Cardiovascular: Positive for leg swelling. Negative for chest pain and palpitations.       Very mild edema in evenings  Gastrointestinal: Positive for constipation. Negative for abdominal pain, blood in stool, diarrhea and melena.  Genitourinary: Positive for frequency and urgency. Negative for dysuria.  Musculoskeletal: Positive for myalgias.       Hip cramps, hand cramps  Skin: Negative for rash.  Neurological: Negative for dizziness, loss of consciousness, weakness and headaches.  Endo/Heme/Allergies: Does not bruise/bleed easily.  Psychiatric/Behavioral: Negative for depression and memory loss.       Caregiver stress, handling well with family support to look after her husband when she needs to get out    Health Maintenance  Topic Date Due  . MAMMOGRAM  10/25/2015  . INFLUENZA VACCINE  01/06/2016  . TETANUS/TDAP  12/27/2018    . DEXA SCAN  Completed  . ZOSTAVAX  Completed  . PNA vac Low Risk Adult  Completed    Physical Exam: Vitals:   01/01/16 0946  BP: 122/70  Pulse: 75  Temp: 98.2 F (36.8 C)  TempSrc: Oral  SpO2: 98%  Weight: 133 lb (60.3 kg)  Height: 5\' 7"  (1.702 m)   Body mass index is 20.83 kg/m. Physical Exam  Constitutional: She is oriented to person, place, and time. She appears well-developed and well-nourished. No distress.  Eyes:  glasses  Cardiovascular: Normal rate, regular rhythm, normal heart sounds and intact distal pulses.   Pulmonary/Chest: Effort normal and breath sounds normal. No respiratory distress.  Abdominal: Soft. Bowel sounds are normal.  Musculoskeletal: Normal range of motion.  Neurological: She is alert and oriented to person, place, and time.  Skin: Skin is warm and dry.  Psychiatric: She has a normal mood and affect.    Labs reviewed: Basic Metabolic Panel:  Recent Labs  03/27/15 1015 07/03/15 1019 12/29/15 0801  NA 141  --  141  K 4.3  --  4.4  CL 102  --  105  CO2 26  --  29  GLUCOSE 98  --  97  BUN 19  --  15  CREATININE 0.89  --  0.87  CALCIUM 9.5  --  9.3  TSH 2.840 3.140 4.15   Liver Function Tests:  Recent Labs  03/27/15 1015 12/29/15 0801  AST 19 25  ALT 17 17  ALKPHOS 65 58  BILITOT 1.4* 1.3*  PROT 7.0 7.3  ALBUMIN 4.4 3.9   No results for input(s): LIPASE, AMYLASE in the last 8760 hours. No results for input(s): AMMONIA in the last 8760 hours. CBC:  Recent Labs  03/27/15 1015 12/29/15 0801  WBC 5.7 5.5  NEUTROABS 3.4 3,025  HGB  --  12.1  HCT 36.6 35.9  MCV 93 92.5  PLT 234 243   Lipid Panel:  Recent Labs  03/27/15 1015 12/29/15 0801  CHOL 126 128  HDL 50 55  LDLCALC 56 55  TRIG 100 91  CHOLHDL 2.5 2.3   Lab Results  Component Value Date   HGBA1C 6.2 (H) 12/29/2015   Assessment/Plan 1. Hypothyroidism due to acquired atrophy of thyroid -stable, tsh was normal, cont same synthroid  2.  Hyperlipidemia -lipids well below goal and getting cramps so reduce lipitor from 20mg  to 10mg  at hs and [facility]  3. Hyperglycemia - was worse after eating more candy--says she will reduce this again - Hemoglobin A1c; Future  4. Urge urinary incontinence -cont myrbetriq which has generally helped her -also does have some stress incontinence and uses pads off and on esp after lifting something heavy like bird seed bags  5. Cramps of lower extremity, unspecified laterality - suspect due to lipitor, bilateral hips involved which was odd  6. Caregiver stress -is getting help from children with looking after [place] and seems to be handling it quite well and modifying their life as needed  Labs/tests ordered:   Orders Placed This Encounter  Procedures  . Hemoglobin A1c    Standing Status:   Future    Standing Expiration Date:   12/31/2016    Next appt:  04/05/2016 med mgt with hba1c before  Ivannah Zody L. [place] Millon, D.O. [facility] [address], [place] [address] Cell Phone (Mon-Fri 8am-5pm):  [phone] On Call:  [phone] & follow prompts after 5pm & weekends Office Phone:  [phone] Office Fax:  [phone]

## 2016-03-20 ENCOUNTER — Other Ambulatory Visit: Payer: Self-pay | Admitting: Family Medicine

## 2016-04-02 ENCOUNTER — Other Ambulatory Visit: Payer: Medicare Other

## 2016-04-02 DIAGNOSIS — R739 Hyperglycemia, unspecified: Secondary | ICD-10-CM

## 2016-04-02 LAB — HEMOGLOBIN A1C
Hgb A1c MFr Bld: 5.7 % — ABNORMAL HIGH (ref <5.7)
Mean Plasma Glucose: 117 mg/dL

## 2016-04-05 ENCOUNTER — Encounter: Payer: Self-pay | Admitting: Internal Medicine

## 2016-04-05 ENCOUNTER — Ambulatory Visit (INDEPENDENT_AMBULATORY_CARE_PROVIDER_SITE_OTHER): Payer: Medicare Other | Admitting: Internal Medicine

## 2016-04-05 VITALS — BP 128/60 | HR 67 | Temp 98.0°F | Wt 133.0 lb

## 2016-04-05 DIAGNOSIS — E034 Atrophy of thyroid (acquired): Secondary | ICD-10-CM

## 2016-04-05 DIAGNOSIS — R252 Cramp and spasm: Secondary | ICD-10-CM | POA: Diagnosis not present

## 2016-04-05 DIAGNOSIS — R739 Hyperglycemia, unspecified: Secondary | ICD-10-CM

## 2016-04-05 DIAGNOSIS — M19042 Primary osteoarthritis, left hand: Secondary | ICD-10-CM | POA: Diagnosis not present

## 2016-04-05 DIAGNOSIS — E785 Hyperlipidemia, unspecified: Secondary | ICD-10-CM | POA: Diagnosis not present

## 2016-04-05 DIAGNOSIS — N3281 Overactive bladder: Secondary | ICD-10-CM | POA: Diagnosis not present

## 2016-04-05 DIAGNOSIS — M19041 Primary osteoarthritis, right hand: Secondary | ICD-10-CM

## 2016-04-05 NOTE — Progress Notes (Signed)
Location:  [facility] clinic Provider:  Artemio Dobie L. Mariea Clonts, D.O., C.M.D.  Code Status: DNR Goals of Care:  Advanced Directives 04/05/2016  Does patient have an advance directive? Yes  Type of Advance Directive -  Does patient want to make changes to advanced directive? -  Copy of advanced directive(s) in chart? No - copy requested  Would patient like information on creating an advanced directive? -    Chief Complaint  Patient presents with  . Medical Management of Chronic Issues    3 mth follow-up    HPI: Patient is a 82 y.o. female seen today for medical management of chronic diseases.    Hyperglycemia:  Sugar average much improved.    Cramping is some better especially the one in the hips.  Gets them a little when she wakes up and stretches, then walks and they go away.  Hands cramping more.  Is pulling a lot of weeds.  Slight ache in the hands this am, but not cramping yet.  Not needing medication.    Has caregiver stress from her husband--he gets up and wanders some now and did have a recent fall.  Memory seems about the same she says.  Repeats his old stories.  He won't let her out of his sight.  They went to lunch with a friend.  She went to get the car and he asked their friend many many times what happened to her.    OAB:  Occasional rare leakage w/o much urge. Continues to wear a pad in case.   Past Medical History:  Diagnosis Date  . Arthritis   . High cholesterol   . IBS (irritable bowel syndrome)   . Menopause   . Thyroid disease   . Tubular adenoma 01/13/2007    Past Surgical History:  Procedure Laterality Date  . biopsy left leg     still has scab on it pt states  . CATARACT EXTRACTION, BILATERAL  2012  . COLONOSCOPY    . TONSILLECTOMY      No Known Allergies    Medication List       Accurate as of 04/05/16 11:07 AM. Always use your most recent med list.          aspirin 81 MG tablet Take 81 mg by mouth daily.   atorvastatin 10 MG tablet Commonly  known as:  LIPITOR Take 1 tablet (10 mg total) by mouth daily at 6 PM.   CALCIUM 1000 + D PO Take 2 capsules by mouth daily.   cholecalciferol 1000 units tablet Commonly known as:  VITAMIN D Take 1 tablet (1,000 Units total) by mouth daily.   levothyroxine 25 MCG tablet Commonly known as:  SYNTHROID, LEVOTHROID TAKE 1 TABLET DAILY BEFORE BREAKFAST   mirabegron ER 25 MG Tb24 tablet Commonly known as:  MYRBETRIQ Take 1 tablet (25 mg total) by mouth daily.   multivitamin with minerals tablet Take 1 tablet by mouth daily.   polyethylene glycol packet Commonly known as:  MIRALAX / GLYCOLAX Take 17 g by mouth daily as needed.       Review of Systems:  Review of Systems  Constitutional: Negative for chills, fever and malaise/fatigue.  HENT: Negative for hearing loss.   Eyes: Negative for blurred vision.       Glasses  Respiratory: Negative for shortness of breath.   Cardiovascular: Negative for chest pain.  Gastrointestinal: Negative for abdominal pain, blood in stool, constipation, diarrhea and melena.  Genitourinary: Negative for dysuria.  Musculoskeletal: Positive  for joint pain and myalgias. Negative for falls.  Skin: Negative for itching and rash.  Neurological: Negative for dizziness, loss of consciousness, weakness and headaches.  Psychiatric/Behavioral: Negative for depression and memory loss. The patient is not nervous/anxious and does not have insomnia.     Health Maintenance  Topic Date Due  . MAMMOGRAM  10/25/2015  . INFLUENZA VACCINE  01/06/2016  . TETANUS/TDAP  12/27/2018  . DEXA SCAN  Completed  . ZOSTAVAX  Completed  . PNA vac Low Risk Adult  Completed    Physical Exam: Vitals:   04/05/16 1021  BP: 128/60  Pulse: 67  Temp: 98 F (36.7 C)  TempSrc: Oral  SpO2: 98%  Weight: 133 lb (60.3 kg)   Body mass index is 20.83 kg/m. Physical Exam  Constitutional: She is oriented to person, place, and time. She appears well-developed and well-nourished.  No distress.  Cardiovascular: Normal rate, regular rhythm, normal heart sounds and intact distal pulses.   Pulmonary/Chest: Effort normal and breath sounds normal. No respiratory distress.  Abdominal: Soft. Bowel sounds are normal. She exhibits no distension. There is no tenderness.  Musculoskeletal: Normal range of motion.  Mild heberden's and bouchard's node deformities developing in hands  Neurological: She is alert and oriented to person, place, and time.  Skin: Skin is warm and dry.    Labs reviewed: Basic Metabolic Panel:  Recent Labs  07/03/15 1019 12/29/15 0801  NA  --  141  K  --  4.4  CL  --  105  CO2  --  29  GLUCOSE  --  97  BUN  --  15  CREATININE  --  0.87  CALCIUM  --  9.3  TSH 3.140 4.15   Liver Function Tests:  Recent Labs  12/29/15 0801  AST 25  ALT 17  ALKPHOS 58  BILITOT 1.3*  PROT 7.3  ALBUMIN 3.9   No results for input(s): LIPASE, AMYLASE in the last 8760 hours. No results for input(s): AMMONIA in the last 8760 hours. CBC:  Recent Labs  12/29/15 0801  WBC 5.5  NEUTROABS 3,025  HGB 12.1  HCT 35.9  MCV 92.5  PLT 243   Lipid Panel:  Recent Labs  12/29/15 0801  CHOL 128  HDL 55  LDLCALC 55  TRIG 91  CHOLHDL 2.3   Lab Results  Component Value Date   HGBA1C 5.7 (H) 04/02/2016   Assessment/Plan 1. Cramps of left lower extremity - none since last visit, but still having some in her hands that seems arthritis related  2. Primary osteoarthritis of both hands -getting gradually worse over time, but not bad enough that she needs to use medications  3. Hypothyroidism due to acquired atrophy of thyroid -cont levothyroxine at current dose and monitor  4. Hyperglycemia -improved considerably, cont healthy balanced diet and exercise  5. Hyperlipidemia, unspecified hyperlipidemia type -continue lipitor and monitor lipids  6. OAB (overactive bladder) -cont myrbetriq therapy which has helped  Labs/tests ordered:  No orders of  the defined types were placed in this encounter.  Next appt:  07/15/2016 for AWV, CPE  Jakyrie Totherow L. Alucard Fearnow, D.O. [facility] [address], [place] [address] Cell Phone (Mon-Fri 8am-5pm):  [phone] On Call:  [phone] & follow prompts after 5pm & weekends Office Phone:  [phone] Office Fax:  [phone]

## 2016-06-21 ENCOUNTER — Other Ambulatory Visit: Payer: Self-pay | Admitting: Family Medicine

## 2016-06-26 ENCOUNTER — Other Ambulatory Visit: Payer: Self-pay | Admitting: Family Medicine

## 2016-07-15 ENCOUNTER — Encounter: Payer: Medicare Other | Admitting: Internal Medicine

## 2016-07-15 ENCOUNTER — Ambulatory Visit: Payer: Medicare Other

## 2016-09-06 ENCOUNTER — Ambulatory Visit (INDEPENDENT_AMBULATORY_CARE_PROVIDER_SITE_OTHER): Payer: Medicare Other | Admitting: Internal Medicine

## 2016-09-06 ENCOUNTER — Encounter: Payer: Self-pay | Admitting: Internal Medicine

## 2016-09-06 VITALS — BP 120/80 | HR 74 | Temp 98.0°F | Wt 133.0 lb

## 2016-09-06 DIAGNOSIS — A6920 Lyme disease, unspecified: Secondary | ICD-10-CM | POA: Diagnosis not present

## 2016-09-06 MED ORDER — DOXYCYCLINE HYCLATE 100 MG PO TABS: 100.0000 mg | ORAL_TABLET | Freq: Two times a day (BID) | ORAL | 0 refills | Status: DC

## 2016-09-06 NOTE — Progress Notes (Signed)
Location:  [facility] clinic Provider: Antonette Hendricks L. Mariea Clonts, D.O., C.M.D.  Code Status: DNR Goals of Care:  Advanced Directives 04/05/2016  Does Patient Have a Medical Advance Directive? Yes  Type of Advance Directive -  Does patient want to make changes to medical advance directive? -  Copy of Tara Harris in Chart? No - copy requested  Would patient like information on creating a medical advance directive? -   Chief Complaint  Patient presents with  . Acute Visit    knot on neck, tick bite    HPI: Patient is a 83 y.o. female seen today for an acute visit for a knot on her neck  Where she reports being bitten by a tick.  Happened first on Tuesday night on the very back of her night.  Her son in law got it out in 3 pieces yesterday.  She pulled another off of the right side of her neck when in here this am.  No fever, myalgias or arthralgias.  Does have local rash at site and quite swollen.  It's been quite itchy.    Past Medical History:  Diagnosis Date  . Arthritis   . High cholesterol   . IBS (irritable bowel syndrome)   . Menopause   . Thyroid disease   . Tubular adenoma 01/13/2007    Past Surgical History:  Procedure Laterality Date  . biopsy left leg     still has scab on it pt states  . CATARACT EXTRACTION, BILATERAL  2012  . COLONOSCOPY    . TONSILLECTOMY      No Known Allergies  Allergies as of 09/06/2016   No Known Allergies     Medication List       Accurate as of 09/06/16 12:06 PM. Always use your most recent med list.          aspirin 81 MG tablet Take 81 mg by mouth daily.   atorvastatin 10 MG tablet Commonly known as:  LIPITOR Take 1 tablet (10 mg total) by mouth daily at 6 PM.   CALCIUM 1000 + D PO Take 2 capsules by mouth daily.   cholecalciferol 1000 units tablet Commonly known as:  VITAMIN D Take 1 tablet (1,000 Units total) by mouth daily.   levothyroxine 25 MCG tablet Commonly known as:  SYNTHROID, LEVOTHROID TAKE 1 TABLET  DAILY BEFORE BREAKFAST   multivitamin with minerals tablet Take 1 tablet by mouth daily.   MYRBETRIQ 25 MG Tb24 tablet Generic drug:  mirabegron ER TAKE 1 TABLET DAILY (NEED OFFICE VISIT FOR FURTHER REFILLS)   polyethylene glycol packet Commonly known as:  MIRALAX / GLYCOLAX Take 17 g by mouth daily as needed.       Review of Systems:  Review of Systems  Constitutional: Negative for chills, fever and malaise/fatigue.  Musculoskeletal: Negative for joint pain and myalgias.  Skin: Positive for itching and rash.  Neurological: Negative for weakness.    Health Maintenance  Topic Date Due  . MAMMOGRAM  10/25/2015  . INFLUENZA VACCINE  01/05/2017  . TETANUS/TDAP  12/27/2018  . DEXA SCAN  Completed  . PNA vac Low Risk Adult  Completed    Physical Exam: Vitals:   09/06/16 1145  BP: 120/80  Pulse: 74  Temp: 98 F (36.7 C)  TempSrc: Oral  SpO2: 98%  Weight: 133 lb (60.3 kg)   Body mass index is 20.83 kg/m. Physical Exam  Constitutional: She is oriented to person, place, and time. She appears well-developed and well-nourished.  No distress.  Cardiovascular: Normal rate, regular rhythm and normal heart sounds.   Pulmonary/Chest: Effort normal and breath sounds normal. No respiratory distress.  Abdominal: Soft. Bowel sounds are normal.  Neurological: She is alert and oriented to person, place, and time.  Skin: There is erythema.  Raised erythematous area on posterior neck where tick has been for almost a week and another tick found on side of neck    Labs reviewed: Basic Metabolic Panel:  Recent Labs  12/29/15 0801  NA 141  K 4.4  CL 105  CO2 29  GLUCOSE 97  BUN 15  CREATININE 0.87  CALCIUM 9.3  TSH 4.15   Liver Function Tests:  Recent Labs  12/29/15 0801  AST 25  ALT 17  ALKPHOS 58  BILITOT 1.3*  PROT 7.3  ALBUMIN 3.9   No results for input(s): LIPASE, AMYLASE in the last 8760 hours. No results for input(s): AMMONIA in the last 8760  hours. CBC:  Recent Labs  12/29/15 0801  WBC 5.5  NEUTROABS 3,025  HGB 12.1  HCT 35.9  MCV 92.5  PLT 243   Lipid Panel:  Recent Labs  12/29/15 0801  CHOL 128  HDL 55  LDLCALC 55  TRIG 91  CHOLHDL 2.3   Lab Results  Component Value Date   HGBA1C 5.7 (H) 04/02/2016    Assessment/Plan 1. Erythema migrans (Lyme disease) - doxycycline (VIBRA-TABS) 100 MG tablet; Take 1 tablet (100 mg total) by mouth 2 (two) times daily.  Dispense: 20 tablet; Refill: 0 -to call back if other symptoms develop  Labs/tests ordered:  No new Next appt: keep regular visit, prn  Shianne Zeiser L. Clara Herbison, D.O. [facility] [address], [place] [address] Cell Phone (Mon-Fri 8am-5pm):  [phone] On Call:  [phone] & follow prompts after 5pm & weekends Office Phone:  [phone] Office Fax:  [phone]

## 2016-09-06 NOTE — Patient Instructions (Signed)
Lyme Disease Lyme disease is an infection that affects many parts of the body, including the skin, joints, and nervous system. It is a bacterial infection that starts from the bite of an infected tick. The infection can spread, and some of the symptoms are similar to the flu. If Lyme disease is not treated, it may cause joint pain, swelling, numbness, problems thinking, fatigue, muscle weakness, and other problems. What are the causes? This condition is caused by bacteria called Borrelia burgdorferi. You can get Lyme disease by being bitten by an infected tick. The tick must be attached to your skin to pass along the infection. Deer often carry infected ticks. What increases the risk? The following factors may make you more likely to develop this condition:  Living in or visiting these areas in the [place]  [place].  The [place] states.  The [place].  Spending time in wooded or grassy areas.  Being outdoors with exposed skin.  Camping, gardening, hiking, fishing, or hunting outdoors.  Failing to remove a tick from your skin within 3-4 days. What are the signs or symptoms? Symptoms of this condition include:  A round, red rash that surrounds the center of the tick bite. This is the first sign of infection. The center of the rash may be blood colored or have tiny blisters.  Fatigue.  Headache.  Chills and fever.  General achiness.  Joint pain, often in the knees.  Muscle pain.  Swollen lymph glands.  Stiff neck. How is this diagnosed? This condition is diagnosed based on:  Your symptoms and medical history.  A physical exam.  A blood test. How is this treated? The main treatment for this condition is antibiotic medicine, which is usually taken by mouth (orally). The length of treatment depends on how soon after a tick bite you begin taking the medicine. In some cases, treatment is necessary for several weeks. If the infection is severe, antibiotics may  need to be given through an IV tube that is inserted into one of your veins. Follow these instructions at home:  Take your antibiotic medicine as told by your health care provider. Do not stop taking the antibiotic even if you start to feel better.  Ask your health care provider about takinga probiotic in between doses of your antibiotic to help avoid stomach upset or diarrhea.  Check with your health care provider before supplementing your treatment. Many alternative therapies have not been proven and may be harmful to you.  Keep all follow-up visits as told by your health care provider. This is important. How is this prevented? You can become reinfected if you get another tick bite from an infected tick. Take these steps to help prevent an infection:  Cover your skin with light-colored clothing when you are outdoors in the spring and summer months.  Spray clothing and skin with bug spray. The spray should be 20-30% DEET.  Avoid wooded, grassy, and shaded areas.  Remove yard litter, brush, trash, and plants that attract deer and rodents.  Check yourself for ticks when you come indoors.  Wash clothing worn each day.  Check your pets for ticks before they come inside.  If you find a tick:  Remove it with tweezers.  Clean your hands and the bite area with rubbing alcohol or soap and water. Pregnant women should take special care to avoid tick bites because the infection can be passed along to the fetus. Contact a health care provider if:  You have symptoms after   treatment.  You have removed a tick and want to bring it to your health care provider for testing. Get help right away if:  You have an irregular heartbeat.  You have nerve pain.  Your face feels numb. This information is not intended to replace advice given to you by your health care provider. Make sure you discuss any questions you have with your health care provider. Document Released: 08/30/2000 Document  Revised: 01/13/2016 Document Reviewed: 01/13/2016 Elsevier Interactive Patient Education  2017 Elsevier Inc.  

## 2016-10-19 ENCOUNTER — Telehealth: Payer: Self-pay | Admitting: Internal Medicine

## 2016-10-19 NOTE — Telephone Encounter (Signed)
I spoke w/ Mrs. Valcarcel to reschedule her husband's AWV from 5/17 to 5/16.  She mentioned that [facility] came to their house on 09/09/16 for wellness exam.  I explained that I would look into it and call her back.  Upon further review with billing/medicare, pt and her husband are both due now for AWV.  I tried to call the pt back to inform them of this and keep them on the 5/16 schedule, but there was no answer and no option to leave a voicemail message.  I will try again later today. VDM (DD)

## 2016-10-19 NOTE — Telephone Encounter (Signed)
I spoke with Tara Harris and confirmed that she and her husband can both come tomorrow morning at 8:30 and 9:15 for AWV w/ nurse. VDM (DD)

## 2016-10-20 ENCOUNTER — Ambulatory Visit (INDEPENDENT_AMBULATORY_CARE_PROVIDER_SITE_OTHER): Payer: Medicare Other

## 2016-10-20 VITALS — BP 118/62 | HR 68 | Temp 97.5°F | Ht 67.0 in | Wt 132.0 lb

## 2016-10-20 DIAGNOSIS — Z0000 Encounter for general adult medical examination without abnormal findings: Secondary | ICD-10-CM | POA: Diagnosis not present

## 2016-10-20 NOTE — Progress Notes (Signed)
Quick Notes   Health Maintenance: None     Abnormal Screen: MMSE 28/30. Passed clock     Patient Concerns: Nails becoming more brittle-recommended Biotin. Myrbetriq not working as well as it used to.      Nurse Concerns: None

## 2016-10-20 NOTE — Progress Notes (Signed)
Subjective:   Tara Harris is a 83 y.o. female who presents for Medicare Annual (Subsequent) preventive examination.     Objective:     Vitals: BP 118/62 (BP Location: Right Arm, Patient Position: Sitting)   Pulse 68   Temp 97.5 F (36.4 C) (Oral)   Ht 5\' 7"  (1.702 m)   Wt 132 lb (59.9 kg)   SpO2 97%   BMI 20.67 kg/m   Body mass index is 20.67 kg/m.   Tobacco History  Smoking Status  . Never Smoker  Smokeless Tobacco  . Never Used     Counseling given: Not Answered   Past Medical History:  Diagnosis Date  . Arthritis   . High cholesterol   . IBS (irritable bowel syndrome)   . Menopause   . Thyroid disease   . Tubular adenoma 01/13/2007   Past Surgical History:  Procedure Laterality Date  . biopsy left leg     still has scab on it pt states  . CATARACT EXTRACTION, BILATERAL  2012  . COLONOSCOPY    . TONSILLECTOMY     Family History  Problem Relation Age of Onset  . Alzheimer's disease Mother   . Heart attack Father   . Alzheimer's disease Father   . Heart attack Sister   . Kidney disease Brother   . Colon cancer Neg Hx   . Stomach cancer Neg Hx    History  Sexual Activity  . Sexual activity: Not on file    Outpatient Encounter Prescriptions as of 10/20/2016  Medication Sig  . aspirin 81 MG tablet Take 81 mg by mouth daily.    Marland Kitchen atorvastatin (LIPITOR) 10 MG tablet Take 1 tablet (10 mg total) by mouth daily at 6 PM.  . Calcium Carb-Cholecalciferol (CALCIUM 1000 + D PO) Take 2 capsules by mouth daily.  . cholecalciferol (VITAMIN D) 1000 UNITS tablet Take 1 tablet (1,000 Units total) by mouth daily.  Marland Kitchen doxycycline (VIBRA-TABS) 100 MG tablet Take 1 tablet (100 mg total) by mouth 2 (two) times daily.  Marland Kitchen levothyroxine (SYNTHROID, LEVOTHROID) 25 MCG tablet TAKE 1 TABLET DAILY BEFORE BREAKFAST  . Multiple Vitamins-Minerals (MULTIVITAMIN WITH MINERALS) tablet Take 1 tablet by mouth daily.  Marland Kitchen MYRBETRIQ 25 MG TB24 tablet TAKE 1 TABLET DAILY (NEED OFFICE  VISIT FOR FURTHER REFILLS)  . polyethylene glycol (MIRALAX / GLYCOLAX) packet Take 17 g by mouth daily as needed.   No facility-administered encounter medications on file as of 10/20/2016.     Activities of Daily Living In your present state of health, do you have any difficulty performing the following activities: 10/20/2016  Hearing? N  Vision? N  Difficulty concentrating or making decisions? N  Walking or climbing stairs? N  Dressing or bathing? N  Doing errands, shopping? N  Preparing Food and eating ? N  Using the Toilet? N  In the past six months, have you accidently leaked urine? Y  Do you have problems with loss of bowel control? N  Managing your Medications? N  Managing your Finances? N  Housekeeping or managing your Housekeeping? N  Some recent data might be hidden    Patient Care Team: [name], DO as PCP - General (Geriatric Medicine) Rutherford Guys, MD as Consulting Physician (Ophthalmology) Druscilla Brownie, MD as Consulting Physician (Dermatology)    Assessment:     Exercise Activities and Dietary recommendations Current Exercise Habits: Home exercise routine, Type of exercise: Other - see comments (yard work), Time (Minutes): 30, Frequency (Times/Week): 6,  Weekly Exercise (Minutes/Week): 180, Intensity: Mild, Exercise limited by: None identified  Goals    . Conscious of diet          Starting today pt will continue having a balanced diet.      Fall Risk Fall Risk  10/20/2016 09/06/2016 04/05/2016 01/01/2016 07/03/2015  Falls in the past year? Yes No No No No  Number falls in past yr: 2 or more - - - -  Injury with Fall? No - - - -   Depression Screen PHQ 2/9 Scores 10/20/2016 09/06/2016 04/05/2016 01/01/2016  PHQ - 2 Score 0 0 0 0     Cognitive Function MMSE - Mini Mental State Exam 10/20/2016 07/03/2015  Not completed: - (No Data)  Orientation to time 5 5  Orientation to Place 5 5  Registration 3 3  Attention/ Calculation 5 4  Recall 1 3    Language- name 2 objects 2 2  Language- repeat 1 1  Language- follow 3 step command 3 3  Language- read & follow direction 1 1  Write a sentence 1 1  Copy design 1 0  Total score 28 28        Immunization History  Administered Date(s) Administered  . Influenza Split 03/13/2012  . Influenza Whole 03/17/2007, 02/26/2013  . Influenza, High Dose Seasonal PF 02/05/2014, 03/03/2016  . Influenza,inj,Quad PF,36+ Mos 02/20/2015  . Pneumococcal Conjugate-13 05/21/2014  . Pneumococcal Polysaccharide-23 02/06/2007  . Td 06/07/1998, 12/26/2008  . Zoster 02/06/2007   Screening Tests Health Maintenance  Topic Date Due  . MAMMOGRAM  10/25/2015  . INFLUENZA VACCINE  01/05/2017  . TETANUS/TDAP  12/27/2018  . DEXA SCAN  Completed  . PNA vac Low Risk Adult  Completed      Plan:    I have personally reviewed and addressed the Medicare Annual Wellness questionnaire and have noted the following in the patient's chart:  A. Medical and social history B. Use of alcohol, tobacco or illicit drugs  C. Current medications and supplements D. Functional ability and status E.  Nutritional status F.  Physical activity G. Advance directives H. List of other physicians I.  Hospitalizations, surgeries, and ER visits in previous 12 months J.  [place] to include hearing, vision, cognitive, depression L. Referrals and appointments - none  In addition, I have reviewed and discussed with patient certain preventive protocols, quality metrics, and best practice recommendations. A written personalized care plan for preventive services as well as general preventive health recommendations were provided to patient.  See attached scanned questionnaire for additional information.   Signed,   Rich Reining, RN Nurse Health Advisor   I have reviewed the health advisor's note and was available for consultation. I agree with documentation and plan.   Lexx Monte S. Perlie Gold   [facility] [address] [place], [place] [address] [phone] Cell (Monday-Friday 8 AM - 5 PM) [phone] After 5 PM and follow prompts

## 2016-10-20 NOTE — Patient Instructions (Signed)
Tara Harris , Thank you for taking time to come for your Medicare Wellness Visit. I appreciate your ongoing commitment to your health goals. Please review the following plan we discussed and let me know if I can assist you in the future.   Screening recommendations/referrals: Colonoscopy up to date Mammogram up to date Bone Density up to date Recommended yearly ophthalmology/optometry visit for glaucoma screening and checkup Recommended yearly dental visit for hygiene and checkup  Vaccinations: Influenza vaccine up to date. Due 03/03/2017 Pneumococcal vaccine up to date Tdap vaccine up to date. Due 12/27/2018 Shingles vaccine up to date. If you want the new vaccine let us know and we will send prescription to pharmacy.  Advanced directives: Will double check that it's in the chart.  Conditions/risks identified: None  Next appointment: None upcoming   Preventive Care 65 Years and Older, Female Preventive care refers to lifestyle choices and visits with your health care provider that can promote health and wellness. What does preventive care include?  A yearly physical exam. This is also called an annual well check.  Dental exams once or twice a year.  Routine eye exams. Ask your health care provider how often you should have your eyes checked.  Personal lifestyle choices, including:  Daily care of your teeth and gums.  Regular physical activity.  Eating a healthy diet.  Avoiding tobacco and drug use.  Limiting alcohol use.  Practicing safe sex.  Taking low-dose aspirin every day.  Taking vitamin and mineral supplements as recommended by your health care provider. What happens during an annual well check? The services and screenings done by your health care provider during your annual well check will depend on your age, overall health, lifestyle risk factors, and family history of disease. Counseling  Your health care provider may ask you questions about  your:  Alcohol use.  Tobacco use.  Drug use.  Emotional well-being.  Home and relationship well-being.  Sexual activity.  Eating habits.  History of falls.  Memory and ability to understand (cognition).  Work and work Statistician.  Reproductive health. Screening  You may have the following tests or measurements:  Height, weight, and BMI.  Blood pressure.  Lipid and cholesterol levels. These may be checked every 5 years, or more frequently if you are over 18 years old.  Skin check.  Lung cancer screening. You may have this screening every year starting at age 27 if you have a 30-pack-year history of smoking and currently smoke or have quit within the past 15 years.  Fecal occult blood test (FOBT) of the stool. You may have this test every year starting at age 70.  Flexible sigmoidoscopy or colonoscopy. You may have a sigmoidoscopy every 5 years or a colonoscopy every 10 years starting at age 27.  Hepatitis C blood test.  Hepatitis B blood test.  Sexually transmitted disease (STD) testing.  Diabetes screening. This is done by checking your blood sugar (glucose) after you have not eaten for a while (fasting). You may have this done every 1-3 years.  Bone density scan. This is done to screen for osteoporosis. You may have this done starting at age 63.  Mammogram. This may be done every 1-2 years. Talk to your health care provider about how often you should have regular mammograms. Talk with your health care provider about your test results, treatment options, and if necessary, the need for more tests. Vaccines  Your health care provider may recommend certain vaccines, such as:  Influenza vaccine.  This is recommended every year.  Tetanus, diphtheria, and acellular pertussis (Tdap, Td) vaccine. You may need a Td booster every 10 years.  Zoster vaccine. You may need this after age 68.  Pneumococcal 13-valent conjugate (PCV13) vaccine. One dose is recommended  after age 76.  Pneumococcal polysaccharide (PPSV23) vaccine. One dose is recommended after age 20. Talk to your health care provider about which screenings and vaccines you need and how often you need them. This information is not intended to replace advice given to you by your health care provider. Make sure you discuss any questions you have with your health care provider. Document Released: 06/20/2015 Document Revised: 02/11/2016 Document Reviewed: 03/25/2015 Elsevier Interactive Patient Education  2017 [employer] Prevention in the Home Falls can cause injuries. They can happen to people of all ages. There are many things you can do to make your home safe and to help prevent falls. What can I do on the outside of my home?  Regularly fix the edges of walkways and driveways and fix any cracks.  Remove anything that might make you trip as you walk through a door, such as a raised step or threshold.  Trim any bushes or trees on the path to your home.  Use bright outdoor lighting.  Clear any walking paths of anything that might make someone trip, such as rocks or tools.  Regularly check to see if handrails are loose or broken. Make sure that both sides of any steps have handrails.  Any raised decks and porches should have guardrails on the edges.  Have any leaves, snow, or ice cleared regularly.  Use sand or salt on walking paths during winter.  Clean up any spills in your garage right away. This includes oil or grease spills. What can I do in the bathroom?  Use night lights.  Install grab bars by the toilet and in the tub and shower. Do not use towel bars as grab bars.  Use non-skid mats or decals in the tub or shower.  If you need to sit down in the shower, use a plastic, non-slip stool.  Keep the floor dry. Clean up any water that spills on the floor as soon as it happens.  Remove soap buildup in the tub or shower regularly.  Attach bath mats securely with  double-sided non-slip rug tape.  Do not have throw rugs and other things on the floor that can make you trip. What can I do in the bedroom?  Use night lights.  Make sure that you have a light by your bed that is easy to reach.  Do not use any sheets or blankets that are too big for your bed. They should not hang down onto the floor.  Have a firm chair that has side arms. You can use this for support while you get dressed.  Do not have throw rugs and other things on the floor that can make you trip. What can I do in the kitchen?  Clean up any spills right away.  Avoid walking on wet floors.  Keep items that you use a lot in easy-to-reach places.  If you need to reach something above you, use a strong step stool that has a grab bar.  Keep electrical cords out of the way.  Do not use floor polish or wax that makes floors slippery. If you must use wax, use non-skid floor wax.  Do not have throw rugs and other things on the floor that can make  you trip. What can I do with my stairs?  Do not leave any items on the stairs.  Make sure that there are handrails on both sides of the stairs and use them. Fix handrails that are broken or loose. Make sure that handrails are as long as the stairways.  Check any carpeting to make sure that it is firmly attached to the stairs. Fix any carpet that is loose or worn.  Avoid having throw rugs at the top or bottom of the stairs. If you do have throw rugs, attach them to the floor with carpet tape.  Make sure that you have a light switch at the top of the stairs and the bottom of the stairs. If you do not have them, ask someone to add them for you. What else can I do to help prevent falls?  Wear shoes that:  Do not have high heels.  Have rubber bottoms.  Are comfortable and fit you well.  Are closed at the toe. Do not wear sandals.  If you use a stepladder:  Make sure that it is fully opened. Do not climb a closed stepladder.  Make  sure that both sides of the stepladder are locked into place.  Ask someone to hold it for you, if possible.  Clearly mark and make sure that you can see:  Any grab bars or handrails.  First and last steps.  Where the edge of each step is.  Use tools that help you move around (mobility aids) if they are needed. These include:  Canes.  Walkers.  Scooters.  Crutches.  Turn on the lights when you go into a dark area. Replace any light bulbs as soon as they burn out.  Set up your furniture so you have a clear path. Avoid moving your furniture around.  If any of your floors are uneven, fix them.  If there are any pets around you, be aware of where they are.  Review your medicines with your doctor. Some medicines can make you feel dizzy. This can increase your chance of falling. Ask your doctor what other things that you can do to help prevent falls. This information is not intended to replace advice given to you by your health care provider. Make sure you discuss any questions you have with your health care provider. Document Released: 03/20/2009 Document Revised: 10/30/2015 Document Reviewed: 06/28/2014 Elsevier Interactive Patient Education  2017 [employer].

## 2016-10-27 ENCOUNTER — Other Ambulatory Visit: Payer: Self-pay | Admitting: *Deleted

## 2016-10-27 MED ORDER — ATORVASTATIN CALCIUM 10 MG PO TABS: 10.0000 mg | ORAL_TABLET | Freq: Every day | ORAL | 3 refills | Status: DC

## 2016-10-27 NOTE — Telephone Encounter (Signed)
[employer] [facility]

## 2016-12-18 ENCOUNTER — Other Ambulatory Visit: Payer: Self-pay | Admitting: Family Medicine

## 2016-12-22 ENCOUNTER — Other Ambulatory Visit: Payer: Self-pay

## 2016-12-22 MED ORDER — MIRABEGRON ER 25 MG PO TB24: ORAL_TABLET | ORAL | 1 refills | Status: DC

## 2017-01-21 ENCOUNTER — Emergency Department (HOSPITAL_COMMUNITY): Payer: Medicare Other

## 2017-01-21 ENCOUNTER — Emergency Department (HOSPITAL_COMMUNITY)
Admission: EM | Admit: 2017-01-21 | Discharge: 2017-01-21 | Disposition: A | Discharge status: Home / Self Care | Payer: Medicare Other | Attending: Emergency Medicine | Admitting: Emergency Medicine

## 2017-01-21 ENCOUNTER — Encounter (HOSPITAL_COMMUNITY): Payer: Self-pay | Admitting: Emergency Medicine

## 2017-01-21 DIAGNOSIS — R0789 Other chest pain: Secondary | ICD-10-CM | POA: Insufficient documentation

## 2017-01-21 DIAGNOSIS — Y999 Unspecified external cause status: Secondary | ICD-10-CM | POA: Diagnosis not present

## 2017-01-21 DIAGNOSIS — Z79899 Other long term (current) drug therapy: Secondary | ICD-10-CM | POA: Insufficient documentation

## 2017-01-21 DIAGNOSIS — Z043 Encounter for examination and observation following other accident: Secondary | ICD-10-CM | POA: Diagnosis present

## 2017-01-21 DIAGNOSIS — R1011 Right upper quadrant pain: Secondary | ICD-10-CM | POA: Insufficient documentation

## 2017-01-21 DIAGNOSIS — Y929 Unspecified place or not applicable: Secondary | ICD-10-CM | POA: Diagnosis not present

## 2017-01-21 DIAGNOSIS — W11XXXA Fall on and from ladder, initial encounter: Secondary | ICD-10-CM | POA: Insufficient documentation

## 2017-01-21 DIAGNOSIS — Y939 Activity, unspecified: Secondary | ICD-10-CM | POA: Diagnosis not present

## 2017-01-21 DIAGNOSIS — S2241XA Multiple fractures of ribs, right side, initial encounter for closed fracture: Secondary | ICD-10-CM | POA: Diagnosis not present

## 2017-01-21 DIAGNOSIS — Z7982 Long term (current) use of aspirin: Secondary | ICD-10-CM | POA: Insufficient documentation

## 2017-01-21 LAB — COMPREHENSIVE METABOLIC PANEL
ALBUMIN: 4.5 g/dL (ref 3.5–5.0)
ALT: 26 U/L (ref 14–54)
AST: 34 U/L (ref 15–41)
Alkaline Phosphatase: 47 U/L (ref 38–126)
Anion gap: 9 (ref 5–15)
BUN: 24 mg/dL — AB (ref 6–20)
CHLORIDE: 104 mmol/L (ref 101–111)
CO2: 27 mmol/L (ref 22–32)
CREATININE: 0.85 mg/dL (ref 0.44–1.00)
Calcium: 9.7 mg/dL (ref 8.9–10.3)
GFR calc Af Amer: >60 mL/min (ref >60)
GFR calc non Af Amer: >60 mL/min (ref >60)
GLUCOSE: 112 mg/dL — AB (ref 65–99)
POTASSIUM: 4.2 mmol/L (ref 3.5–5.1)
SODIUM: 140 mmol/L (ref 135–145)
Total Bilirubin: 1.1 mg/dL (ref 0.3–1.2)
Total Protein: 8.1 g/dL (ref 6.5–8.1)

## 2017-01-21 LAB — CBC WITH DIFFERENTIAL/PLATELET
BASOS ABS: 0 10*3/uL (ref 0.0–0.1)
Basophils Relative: 1 %
EOS PCT: 1 %
Eosinophils Absolute: 0.1 10*3/uL (ref 0.0–0.7)
HCT: 34.7 % — ABNORMAL LOW (ref 36.0–46.0)
Hemoglobin: 11.6 g/dL — ABNORMAL LOW (ref 12.0–15.0)
LYMPHS PCT: 18 %
Lymphs Abs: 1.6 10*3/uL (ref 0.7–4.0)
MCH: 32 pg (ref 26.0–34.0)
MCHC: 33.4 g/dL (ref 30.0–36.0)
MCV: 95.6 fL (ref 78.0–100.0)
MONO ABS: 0.6 10*3/uL (ref 0.1–1.0)
Monocytes Relative: 7 %
NEUTROS ABS: 6.5 10*3/uL (ref 1.7–7.7)
Neutrophils Relative %: 73 %
PLATELETS: 244 10*3/uL (ref 150–400)
RBC: 3.63 MIL/uL — AB (ref 3.87–5.11)
RDW: 13.7 % (ref 11.5–15.5)
WBC: 8.8 10*3/uL (ref 4.0–10.5)

## 2017-01-21 LAB — LIPASE, BLOOD: LIPASE: 23 U/L (ref 11–51)

## 2017-01-21 MED ORDER — IOPAMIDOL (ISOVUE-300) INJECTION 61%
INTRAVENOUS | Status: AC
  Filled 2017-01-21: qty 100

## 2017-01-21 MED ORDER — HYDROCODONE-ACETAMINOPHEN 5-325 MG PO TABS
1.0000 | ORAL_TABLET | Freq: Once | ORAL | Status: AC
  Administered 2017-01-21: 1 via ORAL
  Filled 2017-01-21: qty 1

## 2017-01-21 MED ORDER — IBUPROFEN 200 MG PO TABS
400.0000 mg | ORAL_TABLET | Freq: Once | ORAL | Status: AC
  Administered 2017-01-21: 400 mg via ORAL
  Filled 2017-01-21: qty 2

## 2017-01-21 MED ORDER — IOPAMIDOL (ISOVUE-300) INJECTION 61%
100.0000 mL | Freq: Once | INTRAVENOUS | Status: AC | PRN
  Administered 2017-01-21: 100 mL via INTRAVENOUS

## 2017-01-21 MED ORDER — IBUPROFEN 400 MG PO TABS: 400.0000 mg | ORAL_TABLET | Freq: Three times a day (TID) | ORAL | 0 refills | Status: DC

## 2017-01-21 MED ORDER — FENTANYL CITRATE (PF) 100 MCG/2ML IJ SOLN
50.0000 ug | Freq: Once | INTRAMUSCULAR | Status: AC
  Administered 2017-01-21: 50 ug via INTRAVENOUS
  Filled 2017-01-21: qty 2

## 2017-01-21 MED ORDER — HYDROCODONE-ACETAMINOPHEN 5-325 MG PO TABS: 1.0000 | ORAL_TABLET | Freq: Four times a day (QID) | ORAL | 0 refills | Status: DC | PRN

## 2017-01-21 NOTE — ED Notes (Signed)
ED Provider at bedside. 

## 2017-01-21 NOTE — ED Provider Notes (Signed)
[place] DEPT Provider Note   CSN: [id number] Arrival date & time: 01/21/17  1432     History   Chief Complaint Chief Complaint  Patient presents with  . Broken ribs    HPI Tara Harris is a 83 y.o. female.   Fall  This is a new problem. The current episode started 6 to 12 hours ago. The problem occurs constantly. The problem has been gradually worsening. Associated symptoms include chest pain and abdominal pain. Nothing relieves the symptoms. She has tried nothing for the symptoms. The treatment provided no relief.    Past Medical History:  Diagnosis Date  . Arthritis   . High cholesterol   . IBS (irritable bowel syndrome)   . Menopause   . Thyroid disease   . Tubular adenoma 01/13/2007    Patient Active Problem List   Diagnosis Date Noted  . Primary osteoarthritis of both hands 04/05/2016  . Urge urinary incontinence 07/03/2015  . Hyperglycemia 07/03/2015  . Hypothyroidism due to acquired atrophy of thyroid 07/03/2015  . Nocturnal leg cramps 07/03/2015  . Postmenopausal estrogen deficiency 07/03/2015  . Hypothyroidism 02/20/2015  . Skin cancer, basal cell 03/19/2012  . Personal history of adenomatous colonic polyps 01/25/2012  . Cramps of left lower extremity 06/02/2011  . Urge incontinence of urine 05/31/2011  . UNSPECIFIED VITAMIN D DEFICIENCY 01/07/2010  . ARTHRITIS, LEFT KNEE 01/07/2010  . IRRITABLE BOWEL SYNDROME 08/07/2009  . Hyperlipidemia 12/26/2007  . ALLERGIC RHINITIS, CHRONIC 12/26/2007  . ARTHRITIS 12/26/2007  . OSTEOPENIA 12/26/2007    Past Surgical History:  Procedure Laterality Date  . biopsy left leg     still has scab on it pt states  . CATARACT EXTRACTION, BILATERAL  2012  . COLONOSCOPY    . TONSILLECTOMY      OB History    No data available       Home Medications    Prior to Admission medications   Medication Sig Start Date End Date Taking? Authorizing Provider  aspirin 81 MG tablet Take 81 mg by mouth daily after  breakfast.    Yes [provider]  atorvastatin (LIPITOR) 10 MG tablet Take 1 tablet (10 mg total) by mouth daily at 6 PM. 10/27/16  Yes Reed, Tiffany L, DO  Calcium Carb-Cholecalciferol (CALCIUM 1000 + D PO) Take 2 capsules by mouth 2 (two) times daily.    Yes [provider]  cholecalciferol (VITAMIN D) 1000 UNITS tablet Take 1 tablet (1,000 Units total) by mouth daily. 03/27/15  Yes Reed, Tiffany L, DO  levothyroxine (SYNTHROID, LEVOTHROID) 25 MCG tablet TAKE 1 TABLET DAILY BEFORE BREAKFAST 06/28/16  Yes Burchette, Alinda Sierras, MD  mirabegron ER (MYRBETRIQ) 25 MG TB24 tablet TAKE 1 TABLET DAILY 12/22/16  Yes Reed, Tiffany L, DO  Multiple Vitamins-Minerals (MULTIVITAMIN WITH MINERALS) tablet Take 1 tablet by mouth daily after breakfast.    Yes [provider]  polyethylene glycol (MIRALAX / GLYCOLAX) packet Take 17 g by mouth daily as needed for mild constipation or moderate constipation.    Yes [provider]  HYDROcodone-acetaminophen (NORCO/VICODIN) 5-325 MG tablet Take 1 tablet by mouth every 6 (six) hours as needed for severe pain. 01/21/17   Carlissa Pesola, Corene Cornea, MD  ibuprofen (ADVIL,MOTRIN) 400 MG tablet Take 1 tablet (400 mg total) by mouth 3 (three) times daily. 01/21/17   Teagen Mcleary, Corene Cornea, MD    Family History Family History  Problem Relation Age of Onset  . Alzheimer's disease Mother   . Heart attack Father   .  Alzheimer's disease Father   . Heart attack Sister   . Kidney disease Brother   . Colon cancer Neg Hx   . Stomach cancer Neg Hx     Social History Social History  Substance Use Topics  . Smoking status: Never Smoker  . Smokeless tobacco: Never Used  . Alcohol use No     Allergies   Patient has no known allergies.   Review of Systems Review of Systems  Cardiovascular: Positive for chest pain.  Gastrointestinal: Positive for abdominal pain.  All other systems reviewed and are negative.    Physical Exam Updated Vital Signs BP 120/69    Pulse 78   Temp 98.2 F (36.8 C) (Oral)   Resp 18   SpO2 96%   Physical Exam  Constitutional: She is oriented to person, place, and time. She appears well-developed and well-nourished.  HENT:  Head: Normocephalic and atraumatic.  Eyes: Conjunctivae and EOM are normal.  Neck: Normal range of motion.  Cardiovascular: Normal rate and regular rhythm.   Pulmonary/Chest: No stridor. No respiratory distress. She exhibits tenderness (right sided).  Abdominal: Soft. Bowel sounds are normal. She exhibits no distension. There is tenderness (ruq).  Musculoskeletal: She exhibits tenderness (right ribs). She exhibits no edema or deformity.  Neurological: She is alert and oriented to person, place, and time.  Skin: Skin is warm and dry. No erythema. No pallor.  Nursing note and vitals reviewed.    ED Treatments / Results  Labs (all labs ordered are listed, but only abnormal results are displayed) Labs Reviewed  CBC WITH DIFFERENTIAL/PLATELET - Abnormal; Notable for the following:       Result Value   RBC 3.63 (*)    Hemoglobin 11.6 (*)    HCT 34.7 (*)    All other components within normal limits  COMPREHENSIVE METABOLIC PANEL - Abnormal; Notable for the following:    Glucose, Bld 112 (*)    BUN 24 (*)    All other components within normal limits  LIPASE, BLOOD    EKG  EKG Interpretation None       Radiology Ct Chest W Contrast  Result Date: 01/21/2017 CLINICAL DATA:  Status post fall off ladder, approximately 4 feet. Known right-sided rib fractures. Assess for abdominal injury. Initial encounter. EXAM: CT CHEST, ABDOMEN, AND PELVIS WITH CONTRAST TECHNIQUE: Multidetector CT imaging of the chest, abdomen and pelvis was performed following the standard protocol during bolus administration of intravenous contrast. CONTRAST:  123mL ISOVUE-300 IOPAMIDOL (ISOVUE-300) INJECTION 61% COMPARISON:  None. FINDINGS: CT CHEST FINDINGS Cardiovascular: The heart is normal in size. Scattered coronary  artery calcifications are seen. Calcification is noted along the thoracic aorta. The great vessels are grossly unremarkable in appearance. Mediastinum/Nodes: The mediastinum is unremarkable in appearance. No mediastinal lymphadenopathy is seen. No pericardial effusion is identified. A 1.9 cm hypodensity is noted at the thyroid isthmus. No axillary lymphadenopathy is seen. Lungs/Pleura: Bilateral peripheral scarring and fibrotic change are noted. No pleural effusion or pneumothorax is seen. No superimposed focal airspace consolidation is identified. Musculoskeletal: No acute osseous abnormalities are identified. The visualized musculature is unremarkable in appearance. CT ABDOMEN PELVIS FINDINGS Hepatobiliary: The liver is unremarkable in appearance. The gallbladder is unremarkable in appearance. The common bile duct remains normal in caliber. Pancreas: The pancreas is within normal limits. Spleen: The spleen is unremarkable in appearance. Adrenals/Urinary Tract: The adrenal glands are unremarkable in appearance. Scattered bilateral renal cysts are seen. There is no evidence of hydronephrosis. No renal or ureteral stones are identified.  No perinephric stranding is appreciated. Stomach/Bowel: The stomach is unremarkable in appearance. The small bowel is within normal limits. The appendix is normal in caliber, without evidence of appendicitis. Much of the colon is filled with stool, concerning for mild constipation. The distal descending and sigmoid colon are decompressed and grossly unremarkable in appearance. Vascular/Lymphatic: Scattered calcification is seen along the abdominal aorta and its branches, with minimal associated mural thrombus but no significant luminal narrowing. No retroperitoneal or pelvic sidewall lymphadenopathy is seen. Reproductive: The bladder is mildly distended and grossly unremarkable. The uterus is unremarkable in appearance. The ovaries are grossly symmetric. No suspicious adnexal masses  are seen. Other: No additional soft tissue abnormalities are seen. Musculoskeletal: There are mildly displaced fractures of the right lateral sixth through ninth ribs. Multilevel disc space narrowing is noted along the lumbar spine, with underlying facet disease. The visualized musculature is unremarkable in appearance. IMPRESSION: 1. Mildly displaced fractures of the right lateral sixth through ninth ribs. 2. Bilateral peripheral scarring and fibrotic change noted. Lungs otherwise grossly clear. 3. Scattered coronary artery calcifications seen. 4. **An incidental finding of potential clinical significance has been found. 1.9 cm hypodensity at the thyroid isthmus. Consider further evaluation with thyroid ultrasound. If patient is clinically hyperthyroid, consider nuclear medicine thyroid uptake and scan.** 5. Scattered bilateral renal cysts. 6. Colon largely filled with stool, concerning for mild constipation. 7. Scattered aortic atherosclerosis. Electronically Signed   By: Garald Balding M.D.   On: 01/21/2017 21:36   Ct Abdomen Pelvis W Contrast  Result Date: 01/21/2017 CLINICAL DATA:  Status post fall off ladder, approximately 4 feet. Known right-sided rib fractures. Assess for abdominal injury. Initial encounter. EXAM: CT CHEST, ABDOMEN, AND PELVIS WITH CONTRAST TECHNIQUE: Multidetector CT imaging of the chest, abdomen and pelvis was performed following the standard protocol during bolus administration of intravenous contrast. CONTRAST:  158mL ISOVUE-300 IOPAMIDOL (ISOVUE-300) INJECTION 61% COMPARISON:  None. FINDINGS: CT CHEST FINDINGS Cardiovascular: The heart is normal in size. Scattered coronary artery calcifications are seen. Calcification is noted along the thoracic aorta. The great vessels are grossly unremarkable in appearance. Mediastinum/Nodes: The mediastinum is unremarkable in appearance. No mediastinal lymphadenopathy is seen. No pericardial effusion is identified. A 1.9 cm hypodensity is noted  at the thyroid isthmus. No axillary lymphadenopathy is seen. Lungs/Pleura: Bilateral peripheral scarring and fibrotic change are noted. No pleural effusion or pneumothorax is seen. No superimposed focal airspace consolidation is identified. Musculoskeletal: No acute osseous abnormalities are identified. The visualized musculature is unremarkable in appearance. CT ABDOMEN PELVIS FINDINGS Hepatobiliary: The liver is unremarkable in appearance. The gallbladder is unremarkable in appearance. The common bile duct remains normal in caliber. Pancreas: The pancreas is within normal limits. Spleen: The spleen is unremarkable in appearance. Adrenals/Urinary Tract: The adrenal glands are unremarkable in appearance. Scattered bilateral renal cysts are seen. There is no evidence of hydronephrosis. No renal or ureteral stones are identified. No perinephric stranding is appreciated. Stomach/Bowel: The stomach is unremarkable in appearance. The small bowel is within normal limits. The appendix is normal in caliber, without evidence of appendicitis. Much of the colon is filled with stool, concerning for mild constipation. The distal descending and sigmoid colon are decompressed and grossly unremarkable in appearance. Vascular/Lymphatic: Scattered calcification is seen along the abdominal aorta and its branches, with minimal associated mural thrombus but no significant luminal narrowing. No retroperitoneal or pelvic sidewall lymphadenopathy is seen. Reproductive: The bladder is mildly distended and grossly unremarkable. The uterus is unremarkable in appearance. The ovaries are grossly symmetric.  No suspicious adnexal masses are seen. Other: No additional soft tissue abnormalities are seen. Musculoskeletal: There are mildly displaced fractures of the right lateral sixth through ninth ribs. Multilevel disc space narrowing is noted along the lumbar spine, with underlying facet disease. The visualized musculature is unremarkable in  appearance. IMPRESSION: 1. Mildly displaced fractures of the right lateral sixth through ninth ribs. 2. Bilateral peripheral scarring and fibrotic change noted. Lungs otherwise grossly clear. 3. Scattered coronary artery calcifications seen. 4. **An incidental finding of potential clinical significance has been found. 1.9 cm hypodensity at the thyroid isthmus. Consider further evaluation with thyroid ultrasound. If patient is clinically hyperthyroid, consider nuclear medicine thyroid uptake and scan.** 5. Scattered bilateral renal cysts. 6. Colon largely filled with stool, concerning for mild constipation. 7. Scattered aortic atherosclerosis. Electronically Signed   By: Garald Balding M.D.   On: 01/21/2017 21:36    Procedures Procedures (including critical care time)  Medications Ordered in ED Medications  iopamidol (ISOVUE-300) 61 % injection (not administered)  fentaNYL (SUBLIMAZE) injection 50 mcg (50 mcg Intravenous Given 01/21/17 1916)  iopamidol (ISOVUE-300) 61 % injection 100 mL (100 mLs Intravenous Contrast Given 01/21/17 2105)  ibuprofen (ADVIL,MOTRIN) tablet 400 mg (400 mg Oral Given 01/21/17 2248)  HYDROcodone-acetaminophen (NORCO/VICODIN) 5-325 MG per tablet 1 tablet (1 tablet Oral Given 01/21/17 2248)     Initial Impression / Assessment and Plan / ED Course  I have reviewed the triage vital signs and the nursing notes.  Pertinent labs & imaging results that were available during my care of the patient were reviewed by me and considered in my medical decision making (see chart for details).   Fall with multiple broken ribs. Pain controlled. Will rx for pain control and incentive spirometer with pcp follow up. No hypoxia, splinting, PTX, lung disease or other indication for admission at this time.   Final Clinical Impressions(s) / ED Diagnoses   Final diagnoses:  Closed fracture of multiple ribs of right side, initial encounter    New Prescriptions Discharge Medication List as  of 01/21/2017 10:22 PM    START taking these medications   Details  HYDROcodone-acetaminophen (NORCO/VICODIN) 5-325 MG tablet Take 1 tablet by mouth every 6 (six) hours as needed for severe pain., Starting Fri 01/21/2017, Print    ibuprofen (ADVIL,MOTRIN) 400 MG tablet Take 1 tablet (400 mg total) by mouth 3 (three) times daily., Starting Fri 01/21/2017, Print         Ludivina Guymon, Corene Cornea, MD 01/21/17 2337

## 2017-01-21 NOTE — ED Triage Notes (Addendum)
Pt states that she was on a ladder and fell from a height of about 4 feet.  Pt states that she went to [facility] urgent care where they did an x ray and found that she had multiple rib fx.  Sent here for further eval.  Pt states that she has pain on rt side of chest to back.  Pt denies hitting her head or any other pain.  Pt takes low dose aspirin.

## 2017-01-24 ENCOUNTER — Other Ambulatory Visit: Payer: Self-pay | Admitting: Internal Medicine

## 2017-01-24 ENCOUNTER — Telehealth: Payer: Self-pay | Admitting: *Deleted

## 2017-01-24 DIAGNOSIS — E039 Hypothyroidism, unspecified: Secondary | ICD-10-CM

## 2017-01-24 NOTE — Telephone Encounter (Signed)
I'm sorry to hear this.  Wrapping of the ribs is no longer done.  She can use ice or heat for 20 minute intervals over the broken rib area.  She also may use topical salonpas with lidocaine (just don't put heat over the patch).  I will order a follow-up ultrasound for her thyroid.

## 2017-01-24 NOTE — Telephone Encounter (Signed)
Can we please call Tara Harris and have her come in for an appt this week? She needs some f/u on her thyroid based on the CT scan and I want to make sure she is tolerating the pain medications they gave her in the ED after her fall. I understand if it's too hard for her to come in b/c I know she has to bring her husband or have someone else look after him while she comes to an appt.   ----- Message -----  From: Syliva Overman, RN  Sent: 01/21/2017 11:04 PM  To: [name], DO

## 2017-01-24 NOTE — Telephone Encounter (Signed)
Spoke with patient and she states the pain is almost unbearable when she moves around. Per pt the pain medication is not helping, pt also wanted to know if wrapping her ribs would feel better? There are no appts available this week for follow-up on the CT scan. Please advise

## 2017-01-25 ENCOUNTER — Emergency Department (HOSPITAL_COMMUNITY)
Admission: EM | Admit: 2017-01-25 | Discharge: 2017-01-25 | Disposition: A | Discharge status: Home / Self Care | Payer: Medicare Other | Attending: Emergency Medicine | Admitting: Emergency Medicine

## 2017-01-25 ENCOUNTER — Emergency Department (HOSPITAL_COMMUNITY): Payer: Medicare Other

## 2017-01-25 ENCOUNTER — Encounter (HOSPITAL_COMMUNITY): Payer: Self-pay | Admitting: Emergency Medicine

## 2017-01-25 DIAGNOSIS — Z79899 Other long term (current) drug therapy: Secondary | ICD-10-CM | POA: Diagnosis not present

## 2017-01-25 DIAGNOSIS — G8911 Acute pain due to trauma: Secondary | ICD-10-CM | POA: Diagnosis present

## 2017-01-25 DIAGNOSIS — S2241XD Multiple fractures of ribs, right side, subsequent encounter for fracture with routine healing: Secondary | ICD-10-CM | POA: Diagnosis not present

## 2017-01-25 DIAGNOSIS — W11XXXD Fall on and from ladder, subsequent encounter: Secondary | ICD-10-CM | POA: Insufficient documentation

## 2017-01-25 DIAGNOSIS — S2241XA Multiple fractures of ribs, right side, initial encounter for closed fracture: Secondary | ICD-10-CM

## 2017-01-25 LAB — CBC WITH DIFFERENTIAL/PLATELET
BASOS ABS: 0 10*3/uL (ref 0.0–0.1)
BASOS PCT: 0 %
EOS ABS: 0.3 10*3/uL (ref 0.0–0.7)
EOS PCT: 4 %
HCT: 34.5 % — ABNORMAL LOW (ref 36.0–46.0)
Hemoglobin: 11.6 g/dL — ABNORMAL LOW (ref 12.0–15.0)
Lymphocytes Relative: 20 %
Lymphs Abs: 1.3 10*3/uL (ref 0.7–4.0)
MCH: 31.9 pg (ref 26.0–34.0)
MCHC: 33.6 g/dL (ref 30.0–36.0)
MCV: 94.8 fL (ref 78.0–100.0)
Monocytes Absolute: 0.6 10*3/uL (ref 0.1–1.0)
Monocytes Relative: 9 %
Neutro Abs: 4.5 10*3/uL (ref 1.7–7.7)
Neutrophils Relative %: 67 %
PLATELETS: 213 10*3/uL (ref 150–400)
RBC: 3.64 MIL/uL — AB (ref 3.87–5.11)
RDW: 13.6 % (ref 11.5–15.5)
WBC: 6.7 10*3/uL (ref 4.0–10.5)

## 2017-01-25 LAB — BASIC METABOLIC PANEL
Anion gap: 6 (ref 5–15)
BUN: 16 mg/dL (ref 6–20)
CO2: 29 mmol/L (ref 22–32)
CREATININE: 0.74 mg/dL (ref 0.44–1.00)
Calcium: 9.2 mg/dL (ref 8.9–10.3)
Chloride: 103 mmol/L (ref 101–111)
GFR calc Af Amer: >60 mL/min (ref >60)
Glucose, Bld: 114 mg/dL — ABNORMAL HIGH (ref 65–99)
Potassium: 4 mmol/L (ref 3.5–5.1)
SODIUM: 138 mmol/L (ref 135–145)

## 2017-01-25 MED ORDER — FENTANYL CITRATE (PF) 100 MCG/2ML IJ SOLN
50.0000 ug | Freq: Once | INTRAMUSCULAR | Status: AC
  Administered 2017-01-25: 50 ug via INTRAVENOUS
  Filled 2017-01-25: qty 2

## 2017-01-25 NOTE — ED Triage Notes (Signed)
Patient from home reports that she fell off ladder Thursday, confirmed right rib fracture. Given pain medication. Pain 10/10 today.

## 2017-01-25 NOTE — Telephone Encounter (Signed)
Patient went to ER today.

## 2017-01-25 NOTE — ED Provider Notes (Signed)
[place] DEPT Provider Note   CSN: [id number] Arrival date & time: 01/25/17  0086     History   Chief Complaint Chief Complaint  Patient presents with  . Rib Fracture    HPI Tara Harris is a 83 y.o. female.  Pt is an 83 year old female who presents with rib pain. She states on Thursday she was climbing an extension ladder and as she was coming down it slipped and she fell onto her right side. She was seen in the emergency department he was diagnosed with multiple rib fractures on the right side. She states that it seemed to be getting better and yesterday felt a little bit better. She's been taking hydrocodone for pain. Today however she states the pain was much worse when she woke up and she wasn't able to get out of bed. EMS was called and she was present prior to the ED. She has not had any of her pain medication this morning. She complains of pain with breathing and movement. She denies any shortness of breath. She has no other injuries from the fall.      Past Medical History:  Diagnosis Date  . Arthritis   . High cholesterol   . IBS (irritable bowel syndrome)   . Menopause   . Thyroid disease   . Tubular adenoma 01/13/2007    Patient Active Problem List   Diagnosis Date Noted  . Primary osteoarthritis of both hands 04/05/2016  . Urge urinary incontinence 07/03/2015  . Hyperglycemia 07/03/2015  . Hypothyroidism due to acquired atrophy of thyroid 07/03/2015  . Nocturnal leg cramps 07/03/2015  . Postmenopausal estrogen deficiency 07/03/2015  . Hypothyroidism 02/20/2015  . Skin cancer, basal cell 03/19/2012  . Personal history of adenomatous colonic polyps 01/25/2012  . Cramps of left lower extremity 06/02/2011  . Urge incontinence of urine 05/31/2011  . UNSPECIFIED VITAMIN D DEFICIENCY 01/07/2010  . ARTHRITIS, LEFT KNEE 01/07/2010  . IRRITABLE BOWEL SYNDROME 08/07/2009  . Hyperlipidemia 12/26/2007  . ALLERGIC RHINITIS, CHRONIC 12/26/2007  . ARTHRITIS  12/26/2007  . OSTEOPENIA 12/26/2007    Past Surgical History:  Procedure Laterality Date  . biopsy left leg     still has scab on it pt states  . CATARACT EXTRACTION, BILATERAL  2012  . COLONOSCOPY    . TONSILLECTOMY      OB History    No data available       Home Medications    Prior to Admission medications   Medication Sig Start Date End Date Taking? Authorizing Provider  aspirin 81 MG tablet Take 81 mg by mouth daily after breakfast.    Yes [provider]  atorvastatin (LIPITOR) 10 MG tablet Take 1 tablet (10 mg total) by mouth daily at 6 PM. 10/27/16  Yes Reed, Tiffany L, DO  Calcium Carb-Cholecalciferol (CALCIUM 1000 + D PO) Take 2 capsules by mouth 2 (two) times daily.    Yes [provider]  cholecalciferol (VITAMIN D) 1000 UNITS tablet Take 1 tablet (1,000 Units total) by mouth daily. 03/27/15  Yes Reed, Tiffany L, DO  HYDROcodone-acetaminophen (NORCO/VICODIN) 5-325 MG tablet Take 1 tablet by mouth every 6 (six) hours as needed for severe pain. 01/21/17  Yes Mesner, Corene Cornea, MD  ibuprofen (ADVIL,MOTRIN) 400 MG tablet Take 1 tablet (400 mg total) by mouth 3 (three) times daily. Patient taking differently: Take 400 mg by mouth 3 (three) times daily as needed for moderate pain.  01/21/17  Yes Mesner, Corene Cornea, MD  levothyroxine (SYNTHROID, LEVOTHROID) 25 MCG  tablet TAKE 1 TABLET DAILY BEFORE BREAKFAST 06/28/16  Yes Burchette, Alinda Sierras, MD  mirabegron ER (MYRBETRIQ) 25 MG TB24 tablet TAKE 1 TABLET DAILY 12/22/16  Yes Reed, Tiffany L, DO  Multiple Vitamins-Minerals (MULTIVITAMIN WITH MINERALS) tablet Take 1 tablet by mouth daily after breakfast.    Yes [provider]  polyethylene glycol (MIRALAX / GLYCOLAX) packet Take 17 g by mouth daily as needed for mild constipation or moderate constipation.    Yes [provider]    Family History Family History  Problem Relation Age of Onset  . Alzheimer's disease Mother   . Heart attack Father   .  Alzheimer's disease Father   . Heart attack Sister   . Kidney disease Brother   . Colon cancer Neg Hx   . Stomach cancer Neg Hx     Social History Social History  Substance Use Topics  . Smoking status: Never Smoker  . Smokeless tobacco: Never Used  . Alcohol use No     Allergies   Patient has no known allergies.   Review of Systems Review of Systems  Constitutional: Negative for chills, diaphoresis, fatigue and fever.  HENT: Negative for congestion, rhinorrhea and sneezing.   Eyes: Negative.   Respiratory: Negative for cough, chest tightness and shortness of breath.   Cardiovascular: Positive for chest pain. Negative for leg swelling.  Gastrointestinal: Negative for abdominal pain, blood in stool, diarrhea, nausea and vomiting.  Genitourinary: Negative for difficulty urinating, flank pain, frequency and hematuria.  Musculoskeletal: Negative for arthralgias and back pain.  Skin: Negative for rash.  Neurological: Negative for dizziness, speech difficulty, weakness, numbness and headaches.     Physical Exam Updated Vital Signs BP (!) 149/68 (BP Location: Right Arm)   Pulse 78   Temp 98.1 F (36.7 C) (Oral)   Resp 16   SpO2 99%   Physical Exam  Constitutional: She is oriented to person, place, and time. She appears well-developed and well-nourished.  HENT:  Head: Normocephalic and atraumatic.  Eyes: Pupils are equal, round, and reactive to light.  Neck: Normal range of motion. Neck supple.  Cardiovascular: Normal rate, regular rhythm and normal heart sounds.   Pulmonary/Chest: Effort normal and breath sounds normal. No respiratory distress. She has no wheezes. She has no rales. She exhibits tenderness.  +tenderness to right anterior and lateral chest wall.  No crepitus or deformity noted  Abdominal: Soft. Bowel sounds are normal. There is no tenderness. There is no rebound and no guarding.  Musculoskeletal: Normal range of motion. She exhibits no edema.    Lymphadenopathy:    She has no cervical adenopathy.  Neurological: She is alert and oriented to person, place, and time.  Skin: Skin is warm and dry. No rash noted.  Psychiatric: She has a normal mood and affect.     ED Treatments / Results  Labs (all labs ordered are listed, but only abnormal results are displayed) Labs Reviewed  BASIC METABOLIC PANEL - Abnormal; Notable for the following:       Result Value   Glucose, Bld 114 (*)    All other components within normal limits  CBC WITH DIFFERENTIAL/PLATELET - Abnormal; Notable for the following:    RBC 3.64 (*)    Hemoglobin 11.6 (*)    HCT 34.5 (*)    All other components within normal limits    EKG  EKG Interpretation None       Radiology Dg Chest 2 View  Result Date: 01/25/2017 CLINICAL DATA:  [place]  from ladder, history of right rib fractures EXAM: CHEST  2 VIEW COMPARISON:  CT chest of 01/21/2017 and chest x-ray of 08/21/2015 FINDINGS: The previous right rib fractures are not as well seen with some deformity noted of the right lateral seventh rib at site of fracture. Biapical pleural-parenchymal scarring remains. Chronic changes at the lung bases also are noted left-greater-than-right. No pneumonia or effusion is seen. Mediastinal and hilar contours are unremarkable. Heart size is stable. No acute bony abnormality is seen. IMPRESSION: 1. Previous right rib fractures are not as well seen, with a fracture of the right lateral seventh rib noted. 2. 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 No definite active process. Electronically Signed   By: Ivar Drape M.D.   On: 01/25/2017 10:02    Procedures Procedures (including critical care time)  Medications Ordered in ED Medications  fentaNYL (SUBLIMAZE) injection 50 mcg (50 mcg Intravenous Given 01/25/17 0930)     Initial Impression / Assessment and Plan / ED Course  I have reviewed the triage vital signs and the nursing notes.  Pertinent labs &  imaging results that were available during my care of the patient were reviewed by me and considered in my medical decision making (see chart for details).     Patient presents with worsening pain from recent rib fractures. She was given dose of fentanyl in the ED and is feeling much better and feels like she's ready go home. She feels like her pain will be controlled with her hydrocodone that she has at home. Chest x-ray shows no underlying pulmonary edema or evidence of contusion/pneumothorax. She has no hypoxia. She was discharged home in good condition. She was given an incentive spirometer. She was encouraged to make a follow-up appointment with her PCP which she has been trying to do. Return precautions were given. She is aware of the thyroid cyst that was seen on the CT scan on her prior visit which will need outpatient follow-up.  Final Clinical Impressions(s) / ED Diagnoses   Final diagnoses:  Closed fracture of multiple ribs of right side, initial encounter    New Prescriptions New Prescriptions   No medications on file     Malvin Johns, MD 01/25/17 1058

## 2017-01-25 NOTE — Telephone Encounter (Signed)
Phone just rang, no answering machine, will try again later  

## 2017-01-25 NOTE — ED Notes (Signed)
Discharge instructions reviewed with patient. Patient verbalizes understanding. VSS.  Incentive spirometer provided to patient. Education provided- patient verbalized and demonstrated understanding.

## 2017-01-25 NOTE — ED Notes (Signed)
Bed: NP00 Expected date:  Expected time:  Means of arrival:  Comments: EMS Chest pain, fx ribs

## 2017-01-27 ENCOUNTER — Encounter: Payer: Self-pay | Admitting: Nurse Practitioner

## 2017-01-27 ENCOUNTER — Ambulatory Visit (INDEPENDENT_AMBULATORY_CARE_PROVIDER_SITE_OTHER): Payer: Medicare Other | Admitting: Nurse Practitioner

## 2017-01-27 VITALS — BP 120/70 | HR 70 | Temp 97.7°F | Ht 67.0 in | Wt 132.0 lb

## 2017-01-27 DIAGNOSIS — E041 Nontoxic single thyroid nodule: Secondary | ICD-10-CM

## 2017-01-27 DIAGNOSIS — S2241XD Multiple fractures of ribs, right side, subsequent encounter for fracture with routine healing: Secondary | ICD-10-CM | POA: Diagnosis not present

## 2017-01-27 NOTE — Progress Notes (Signed)
Careteam: Patient Care Team: Tara Curry, DO as PCP - General (Geriatric Medicine) Rutherford Guys, MD as Consulting Physician (Ophthalmology) Druscilla Brownie, MD as Consulting Physician (Dermatology)  Advanced Directive information    No Known Allergies  Chief Complaint  Patient presents with  . Follow-up    ED Follow-up, broken ribs on right side(resulting from fal), patient also told she has cyst on thyroid that needs to be followed up on . Here with Tara Harris   . Immunizations    will wait on Flu Vaccine  . Medication Refill    No refills needed      HPI: Patient is a 83 y.o. female seen in the office today to follow up ED. On 01/21/2017 she fell onto her right side after climbing an extension ladder. Went to the ED and was found to have rib fractures. She was given hydrocodone for pain which was controlling pain however on 01/25/17 woke up in severe pain and went back to the ED. She was given fentanyl there and pain improved significantly. She is here today to follow up.  Pt is still bad. As long as hydrocodone was her system her pain is tolerable.  However she is afraid she will become addicted to it. Has not taken today. Taken 2 doses ibuprofen which has controlled the pain. Occasional stabbing or shooting pain.  Using IS occasionally throughout the day- min 5 x a day.  Incidental finding of thyroid nodule noted on CT, [place] of thyroid has been ordered.   Review of Systems:  Review of Systems  Constitutional: Negative for chills, fever and malaise/fatigue.  Respiratory: Negative for cough and shortness of breath.   Cardiovascular: Negative for chest pain.  Musculoskeletal: Positive for falls. Negative for joint pain and myalgias.       Right sided Rib pain  Neurological: Negative for weakness.    Past Medical History:  Diagnosis Date  . Arthritis   . High cholesterol   . IBS (irritable bowel syndrome)   . Menopause   . Thyroid disease   . Tubular adenoma  01/13/2007   Past Surgical History:  Procedure Laterality Date  . biopsy left leg     still has scab on it pt states  . CATARACT EXTRACTION, BILATERAL  2012  . COLONOSCOPY    . TONSILLECTOMY     Social History:   reports that she has never smoked. She has never used smokeless tobacco. She reports that she does not drink alcohol or use drugs.  Family History  Problem Relation Age of Onset  . Alzheimer's disease Mother   . Heart attack Father   . Alzheimer's disease Father   . Heart attack Sister   . Kidney disease Brother   . Colon cancer Neg Hx   . Stomach cancer Neg Hx     Medications: Patient's Medications  New Prescriptions   No medications on file  Previous Medications   ASPIRIN 81 MG TABLET    Take 81 mg by mouth daily after breakfast.    ATORVASTATIN (LIPITOR) 10 MG TABLET    Take 1 tablet (10 mg total) by mouth daily at 6 PM.   CALCIUM CARB-CHOLECALCIFEROL (CALCIUM 1000 + D PO)    Take 2 capsules by mouth 2 (two) times daily.    CHOLECALCIFEROL (VITAMIN D) 1000 UNITS TABLET    Take 1 tablet (1,000 Units total) by mouth daily.   HYDROCODONE-ACETAMINOPHEN (NORCO/VICODIN) 5-325 MG TABLET    Take 1 tablet by mouth every 6 (  six) hours as needed for severe pain.   IBUPROFEN (ADVIL,MOTRIN) 400 MG TABLET    Take 400 mg by mouth 3 (three) times daily as needed.   LEVOTHYROXINE (SYNTHROID, LEVOTHROID) 25 MCG TABLET    TAKE 1 TABLET DAILY BEFORE BREAKFAST   MIRABEGRON ER (MYRBETRIQ) 25 MG TB24 TABLET    TAKE 1 TABLET DAILY   MULTIPLE VITAMINS-MINERALS (MULTIVITAMIN WITH MINERALS) TABLET    Take 1 tablet by mouth daily after breakfast.    POLYETHYLENE GLYCOL (MIRALAX / GLYCOLAX) PACKET    Take 17 g by mouth daily as needed for mild constipation or moderate constipation.   Modified Medications   No medications on file  Discontinued Medications   IBUPROFEN (ADVIL,MOTRIN) 400 MG TABLET    Take 1 tablet (400 mg total) by mouth 3 (three) times daily.     Physical Exam:  Vitals:    01/27/17 1452  BP: 120/70  Pulse: 70  Temp: 97.7 F (36.5 C)  TempSrc: Oral  SpO2: 98%  Weight: 132 lb (59.9 kg)  Height: 5\' 7"  (1.702 m)   Body mass index is 20.67 kg/m.  Physical Exam  Constitutional: She is oriented to person, place, and time. She appears well-developed and well-nourished. No distress.  Eyes:  glasses  Cardiovascular: Normal rate, regular rhythm and normal heart sounds.   Pulmonary/Chest: Effort normal and breath sounds normal. No respiratory distress. She exhibits tenderness (right sided rib cage).  Abdominal: Soft. Bowel sounds are normal.  Musculoskeletal: Normal range of motion.  Neurological: She is alert and oriented to person, place, and time.  Skin: Skin is warm and dry.  Psychiatric: She has a normal mood and affect.    Labs reviewed: Basic Metabolic Panel:  Recent Labs  01/21/17 1915 01/25/17 0921  NA 140 138  K 4.2 4.0  CL 104 103  CO2 27 29  GLUCOSE 112* 114*  BUN 24* 16  CREATININE 0.85 0.74  CALCIUM 9.7 9.2   Liver Function Tests:  Recent Labs  01/21/17 1915  AST 34  ALT 26  ALKPHOS 47  BILITOT 1.1  PROT 8.1  ALBUMIN 4.5    Recent Labs  01/21/17 1915  LIPASE 23   No results for input(s): AMMONIA in the last 8760 hours. CBC:  Recent Labs  01/21/17 1915 01/25/17 0921  WBC 8.8 6.7  NEUTROABS 6.5 4.5  HGB 11.6* 11.6*  HCT 34.7* 34.5*  MCV 95.6 94.8  PLT 244 213   Lipid Panel: No results for input(s): CHOL, HDL, LDLCALC, TRIG, CHOLHDL, LDLDIRECT in the last 8760 hours. TSH: No results for input(s): TSH in the last 8760 hours. A1C: Lab Results  Component Value Date   HGBA1C 5.7 (H) 04/02/2016   Ct Chest W Contrast  Result Date: 01/21/2017 CLINICAL DATA:  Status post fall off ladder, approximately 4 feet. Known right-sided rib fractures. Assess for abdominal injury. Initial encounter. EXAM: CT CHEST, ABDOMEN, AND PELVIS WITH CONTRAST TECHNIQUE: Multidetector CT imaging of the chest, abdomen and pelvis was  performed following the standard protocol during bolus administration of intravenous contrast. CONTRAST:  165mL ISOVUE-300 IOPAMIDOL (ISOVUE-300) INJECTION 61% COMPARISON:  None. FINDINGS: CT CHEST FINDINGS Cardiovascular: The heart is normal in size. Scattered coronary artery calcifications are seen. Calcification is noted along the thoracic aorta. The great vessels are grossly unremarkable in appearance. Mediastinum/Nodes: The mediastinum is unremarkable in appearance. No mediastinal lymphadenopathy is seen. No pericardial effusion is identified. A 1.9 cm hypodensity is noted at the thyroid isthmus. No axillary lymphadenopathy is seen. Lungs/Pleura:  Bilateral peripheral scarring and fibrotic change are noted. No pleural effusion or pneumothorax is seen. No superimposed focal airspace consolidation is identified. Musculoskeletal: No acute osseous abnormalities are identified. The visualized musculature is unremarkable in appearance. CT ABDOMEN PELVIS FINDINGS Hepatobiliary: The liver is unremarkable in appearance. The gallbladder is unremarkable in appearance. The common bile duct remains normal in caliber. Pancreas: The pancreas is within normal limits. Spleen: The spleen is unremarkable in appearance. Adrenals/Urinary Tract: The adrenal glands are unremarkable in appearance. Scattered bilateral renal cysts are seen. There is no evidence of hydronephrosis. No renal or ureteral stones are identified. No perinephric stranding is appreciated. Stomach/Bowel: The stomach is unremarkable in appearance. The small bowel is within normal limits. The appendix is normal in caliber, without evidence of appendicitis. Much of the colon is filled with stool, concerning for mild constipation. The distal descending and sigmoid colon are decompressed and grossly unremarkable in appearance. Vascular/Lymphatic: Scattered calcification is seen along the abdominal aorta and its branches, with minimal associated mural thrombus but no  significant luminal narrowing. No retroperitoneal or pelvic sidewall lymphadenopathy is seen. Reproductive: The bladder is mildly distended and grossly unremarkable. The uterus is unremarkable in appearance. The ovaries are grossly symmetric. No suspicious adnexal masses are seen. Other: No additional soft tissue abnormalities are seen. Musculoskeletal: There are mildly displaced fractures of the right lateral sixth through ninth ribs. Multilevel disc space narrowing is noted along the lumbar spine, with underlying facet disease. The visualized musculature is unremarkable in appearance. IMPRESSION: 1. Mildly displaced fractures of the right lateral sixth through ninth ribs. 2. Bilateral peripheral scarring and fibrotic change noted. Lungs otherwise grossly clear. 3. Scattered coronary artery calcifications seen. 4. **An incidental finding of potential clinical significance has been found. 1.9 cm hypodensity at the thyroid isthmus. Consider further evaluation with thyroid ultrasound. If patient is clinically hyperthyroid, consider nuclear medicine thyroid uptake and scan.** 5. Scattered bilateral renal cysts. 6. Colon largely filled with stool, concerning for mild constipation. 7. Scattered aortic atherosclerosis. Electronically Signed   By: Garald Balding M.D.   On: 01/21/2017 21:36   Ct Abdomen Pelvis W Contrast  Result Date: 01/21/2017 CLINICAL DATA:  Status post fall off ladder, approximately 4 feet. Known right-sided rib fractures. Assess for abdominal injury. Initial encounter. EXAM: CT CHEST, ABDOMEN, AND PELVIS WITH CONTRAST TECHNIQUE: Multidetector CT imaging of the chest, abdomen and pelvis was performed following the standard protocol during bolus administration of intravenous contrast. CONTRAST:  159mL ISOVUE-300 IOPAMIDOL (ISOVUE-300) INJECTION 61% COMPARISON:  None. FINDINGS: CT CHEST FINDINGS Cardiovascular: The heart is normal in size. Scattered coronary artery calcifications are seen.  Calcification is noted along the thoracic aorta. The great vessels are grossly unremarkable in appearance. Mediastinum/Nodes: The mediastinum is unremarkable in appearance. No mediastinal lymphadenopathy is seen. No pericardial effusion is identified. A 1.9 cm hypodensity is noted at the thyroid isthmus. No axillary lymphadenopathy is seen. Lungs/Pleura: Bilateral peripheral scarring and fibrotic change are noted. No pleural effusion or pneumothorax is seen. No superimposed focal airspace consolidation is identified. Musculoskeletal: No acute osseous abnormalities are identified. The visualized musculature is unremarkable in appearance. CT ABDOMEN PELVIS FINDINGS Hepatobiliary: The liver is unremarkable in appearance. The gallbladder is unremarkable in appearance. The common bile duct remains normal in caliber. Pancreas: The pancreas is within normal limits. Spleen: The spleen is unremarkable in appearance. Adrenals/Urinary Tract: The adrenal glands are unremarkable in appearance. Scattered bilateral renal cysts are seen. There is no evidence of hydronephrosis. No renal or ureteral stones are identified. No  perinephric stranding is appreciated. Stomach/Bowel: The stomach is unremarkable in appearance. The small bowel is within normal limits. The appendix is normal in caliber, without evidence of appendicitis. Much of the colon is filled with stool, concerning for mild constipation. The distal descending and sigmoid colon are decompressed and grossly unremarkable in appearance. Vascular/Lymphatic: Scattered calcification is seen along the abdominal aorta and its branches, with minimal associated mural thrombus but no significant luminal narrowing. No retroperitoneal or pelvic sidewall lymphadenopathy is seen. Reproductive: The bladder is mildly distended and grossly unremarkable. The uterus is unremarkable in appearance. The ovaries are grossly symmetric. No suspicious adnexal masses are seen. Other: No additional  soft tissue abnormalities are seen. Musculoskeletal: There are mildly displaced fractures of the right lateral sixth through ninth ribs. Multilevel disc space narrowing is noted along the lumbar spine, with underlying facet disease. The visualized musculature is unremarkable in appearance. IMPRESSION: 1. Mildly displaced fractures of the right lateral sixth through ninth ribs. 2. Bilateral peripheral scarring and fibrotic change noted. Lungs otherwise grossly clear. 3. Scattered coronary artery calcifications seen. 4. **An incidental finding of potential clinical significance has been found. 1.9 cm hypodensity at the thyroid isthmus. Consider further evaluation with thyroid ultrasound. If patient is clinically hyperthyroid, consider nuclear medicine thyroid uptake and scan.** 5. Scattered bilateral renal cysts. 6. Colon largely filled with stool, concerning for mild constipation. 7. Scattered aortic atherosclerosis. Electronically Signed   By: Garald Balding M.D.   On: 01/21/2017 21:36       Dg Chest 2 View  Result Date: 01/25/2017 CLINICAL DATA:  [place] from ladder, history of right rib fractures EXAM: CHEST  2 VIEW COMPARISON:  CT chest of 01/21/2017 and chest x-ray of 08/21/2015 FINDINGS: The previous right rib fractures are not as well seen with some deformity noted of the right lateral seventh rib at site of fracture. Biapical pleural-parenchymal scarring remains. Chronic changes at the lung bases also are noted left-greater-than-right. No pneumonia or effusion is seen. Mediastinal and hilar contours are unremarkable. Heart size is stable. No acute bony abnormality is seen. IMPRESSION: 1. Previous right rib fractures are not as well seen, with a fracture of the right lateral seventh rib noted. 2. 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 No definite active process. Electronically Signed   By: Ivar Drape M.D.   On: 01/25/2017 10:02   Assessment/Plan 1. Closed fracture of multiple  ribs of right side with routine healing, subsequent encounter Still in a lot of pain but overall doing better. Using ibuprofen throughout the day instead of Vicodin, using Vicodin only for SEVERE pain.  Also can add Acetaminophen 650 mg by mouth every 6 hours as needed pain in combination with ibuprofen for better pain relief without using Vicodin to help with pain control. Cont IS  2. Thyroid cyst incidental finding found on CT during ER visit. Ultrasound of the thyroid has been order for further evaluation.   Next appt: 3 months with Dr Mariea Clonts, sooner if needed  [place] K. Harle Battiest  [facility] [phone] 8 am - 5 pm) [phone] (after hours)

## 2017-01-27 NOTE — Patient Instructions (Addendum)
  Call [facility] to schedule ultrasound of thyroid - soft tissue of head/neck. Call [phone]  To use hydrocodone/apap 5-325 mg by mouth if needed for SEVERE pain  Can use ibuprofen 400 mg every 6 hours as needed  Also can add Acetaminophen 650 mg by mouth every 6 hours as needed  Just know that Acetaminophen is in hydrocodone/apap 325 mg  Max dose of Acetaminophen is 3000 mg in 24 hours.   Cont deep breathing exercises.

## 2017-02-10 ENCOUNTER — Ambulatory Visit
Admission: RE | Admit: 2017-02-10 | Discharge: 2017-02-10 | Disposition: A | Discharge status: Home / Self Care | Payer: Medicare Other | Source: Ambulatory Visit | Attending: Internal Medicine | Admitting: Internal Medicine

## 2017-02-10 ENCOUNTER — Other Ambulatory Visit: Payer: Self-pay | Admitting: Internal Medicine

## 2017-02-10 DIAGNOSIS — E039 Hypothyroidism, unspecified: Secondary | ICD-10-CM

## 2017-02-15 ENCOUNTER — Other Ambulatory Visit: Payer: Self-pay | Admitting: Internal Medicine

## 2017-02-15 DIAGNOSIS — E041 Nontoxic single thyroid nodule: Secondary | ICD-10-CM

## 2017-03-08 ENCOUNTER — Telehealth: Payer: Self-pay | Admitting: *Deleted

## 2017-03-08 ENCOUNTER — Other Ambulatory Visit (HOSPITAL_COMMUNITY)
Admission: RE | Admit: 2017-03-08 | Discharge: 2017-03-08 | Disposition: A | Discharge status: Home / Self Care | Payer: Medicare Other | Source: Ambulatory Visit | Attending: General Surgery | Admitting: General Surgery

## 2017-03-08 ENCOUNTER — Ambulatory Visit
Admission: RE | Admit: 2017-03-08 | Discharge: 2017-03-08 | Disposition: A | Discharge status: Home / Self Care | Payer: Medicare Other | Source: Ambulatory Visit | Attending: Internal Medicine | Admitting: Internal Medicine

## 2017-03-08 DIAGNOSIS — E041 Nontoxic single thyroid nodule: Secondary | ICD-10-CM | POA: Diagnosis present

## 2017-03-08 NOTE — Telephone Encounter (Signed)
Vickie with [facility] called and stated that patient is scheduled for a thyroid biopsy today and needs an order placed. Dr. Mariea Clonts placed order. Spoke with Vickie and she will recheck the [place].

## 2017-03-10 ENCOUNTER — Encounter: Payer: Self-pay | Admitting: *Deleted

## 2017-03-16 ENCOUNTER — Telehealth: Payer: Self-pay | Admitting: *Deleted

## 2017-03-16 NOTE — Telephone Encounter (Signed)
The negative biopsy means she does not have cancer.  She still has low thyroid hormones so she should continue her current levothyroxine.

## 2017-03-16 NOTE — Telephone Encounter (Signed)
Patient notified and agreed.  

## 2017-03-16 NOTE — Telephone Encounter (Signed)
Patient called and stated that [facility] called her wanting to renew her Thyroid medication. Patient stated that her biopsy came back clear so she is wondering if she needs to still take it. Please Advise.

## 2017-03-23 ENCOUNTER — Other Ambulatory Visit: Payer: Self-pay | Admitting: Family Medicine

## 2017-05-26 ENCOUNTER — Encounter: Payer: Self-pay | Admitting: Internal Medicine

## 2017-05-26 ENCOUNTER — Ambulatory Visit (INDEPENDENT_AMBULATORY_CARE_PROVIDER_SITE_OTHER): Payer: Medicare Other | Admitting: Internal Medicine

## 2017-05-26 VITALS — BP 130/60 | HR 73 | Temp 98.2°F | Wt 127.0 lb

## 2017-05-26 DIAGNOSIS — G4762 Sleep related leg cramps: Secondary | ICD-10-CM

## 2017-05-26 DIAGNOSIS — E785 Hyperlipidemia, unspecified: Secondary | ICD-10-CM | POA: Diagnosis not present

## 2017-05-26 DIAGNOSIS — M19042 Primary osteoarthritis, left hand: Secondary | ICD-10-CM

## 2017-05-26 DIAGNOSIS — M19041 Primary osteoarthritis, right hand: Secondary | ICD-10-CM

## 2017-05-26 DIAGNOSIS — N3281 Overactive bladder: Secondary | ICD-10-CM

## 2017-05-26 DIAGNOSIS — E034 Atrophy of thyroid (acquired): Secondary | ICD-10-CM | POA: Diagnosis not present

## 2017-05-26 DIAGNOSIS — R739 Hyperglycemia, unspecified: Secondary | ICD-10-CM

## 2017-05-26 MED ORDER — MIRABEGRON ER 25 MG PO TB24: 50.0000 mg | ORAL_TABLET | Freq: Every day | ORAL | 1 refills | Status: DC

## 2017-05-26 NOTE — Progress Notes (Signed)
Location:  [facility] clinic Provider:  Vanissa Strength L. Mariea Clonts, D.O., C.M.D.  Code Status: full code--need to discuss Goals of Care:  Advanced Directives 05/26/2017  Does Patient Have a Medical Advance Directive? No  Type of Advance Directive -  Does patient want to make changes to medical advance directive? -  Copy of [place] in Chart? -  Would patient like information on creating a medical advance directive? No - Patient declined     Chief Complaint  Patient presents with  . Medical Management of Chronic Issues    follow-up    HPI: Patient is a 83 y.o. female seen today for medical management of chronic diseases.    She is feeling fine.  Right middle finger does not close well without help.  Left index is out of shape but works well.  Hasn't needed anything.    Negative biopsy for thyroid nodules.    Doesn't even think about her broken ribs anymore on the right.  Gets papercuts like crazy.  Sleeping pretty good.    Bowels are moving well.  Not usually using miralax.  Only once in a while.    OAB:  It's getting a little weaker.  If she sits a while and feels fine and immediately, she has to get to the bathroom or she'll be sorry.  She is still taking the myrbetriq.  She is going to try taking it at night.  Discussed trying two at night.  She will call if that works for a 50mg  prescription.  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    She is now shaving [place] due to his difficulty caring for himself as his dementia progresses.    She has a frog in her throat in the mornings who wants to hang on.    Leg cramps are much less severe.  If she stretches, she may get them, but they'll go away w/o getting up.  Before she could hardly walk from them in the hips.   Says knees are getting harder to get up and down than they were.    Past Medical History:  Diagnosis Date  . Arthritis   . High cholesterol   . IBS (irritable bowel syndrome)   . Menopause   . Thyroid  disease   . Tubular adenoma 01/13/2007    Past Surgical History:  Procedure Laterality Date  . biopsy left leg     still has scab on it pt states  . CATARACT EXTRACTION, BILATERAL  2012  . COLONOSCOPY    . TONSILLECTOMY      No Known Allergies  Outpatient Encounter Medications as of 05/26/2017  Medication Sig  . aspirin 81 MG tablet Take 81 mg by mouth daily after breakfast.   . atorvastatin (LIPITOR) 10 MG tablet Take 1 tablet (10 mg total) by mouth daily at 6 PM.  . Calcium Carb-Cholecalciferol (CALCIUM 1000 + D PO) Take 2 capsules by mouth 2 (two) times daily.   . cholecalciferol (VITAMIN D) 1000 UNITS tablet Take 1 tablet (1,000 Units total) by mouth daily.  Marland Kitchen levothyroxine (SYNTHROID, LEVOTHROID) 25 MCG tablet TAKE 1 TABLET DAILY BEFORE BREAKFAST  . mirabegron ER (MYRBETRIQ) 25 MG TB24 tablet TAKE 1 TABLET DAILY  . Multiple Vitamins-Minerals (MULTIVITAMIN WITH MINERALS) tablet Take 1 tablet by mouth daily after breakfast.   . polyethylene glycol (MIRALAX / GLYCOLAX) packet Take 17 g by mouth daily as needed for mild constipation or moderate constipation.   . [DISCONTINUED] HYDROcodone-acetaminophen (NORCO/VICODIN) 5-325  MG tablet Take 1 tablet by mouth every 6 (six) hours as needed for severe pain.  . [DISCONTINUED] ibuprofen (ADVIL,MOTRIN) 400 MG tablet Take 400 mg by mouth 3 (three) times daily as needed.   No facility-administered encounter medications on file as of 05/26/2017.     Review of Systems:  Review of Systems  Constitutional: Negative for chills, fever and malaise/fatigue.       Not as energetic as she used to be (85)  HENT: Positive for hearing loss. Negative for congestion.   Eyes: Negative for blurred vision.       Glasses  Respiratory: Negative for cough and shortness of breath.   Cardiovascular: Negative for chest pain, palpitations and leg swelling.  Gastrointestinal: Positive for constipation. Negative for abdominal pain, diarrhea, nausea and vomiting.    Genitourinary: Positive for frequency and urgency.  Musculoskeletal: Positive for joint pain. Negative for falls.  Skin: Negative for itching and rash.  Neurological: Negative for dizziness, loss of consciousness and weakness.  Endo/Heme/Allergies: Bruises/bleeds easily.  Psychiatric/Behavioral: Negative for depression and memory loss.    Health Maintenance  Topic Date Due  . TETANUS/TDAP  12/27/2018  . INFLUENZA VACCINE  Completed  . DEXA SCAN  Completed  . PNA vac Low Risk Adult  Completed  . MAMMOGRAM  Discontinued    Physical Exam: Vitals:   05/26/17 0920  BP: 130/60  Pulse: 73  Temp: 98.2 F (36.8 C)  TempSrc: Oral  SpO2: 99%  Weight: 127 lb (57.6 kg)   Body mass index is 19.89 kg/m. Physical Exam  Constitutional: She is oriented to person, place, and time. She appears well-developed and well-nourished. No distress.  Eyes:  glasses  Neck: Normal range of motion. Neck supple.  Cardiovascular: Normal rate, regular rhythm, normal heart sounds and intact distal pulses.  Pulmonary/Chest: Effort normal and breath sounds normal. No respiratory distress.  Abdominal: Soft. Bowel sounds are normal.  Neurological: She is alert and oriented to person, place, and time.  Skin: Skin is warm and dry. Capillary refill takes less than 2 seconds.  Psychiatric: She has a normal mood and affect.    Labs reviewed: Basic Metabolic Panel: Recent Labs    01/21/17 1915 01/25/17 0921  NA 140 138  K 4.2 4.0  CL 104 103  CO2 27 29  GLUCOSE 112* 114*  BUN 24* 16  CREATININE 0.85 0.74  CALCIUM 9.7 9.2   Liver Function Tests: Recent Labs    01/21/17 1915  AST 34  ALT 26  ALKPHOS 47  BILITOT 1.1  PROT 8.1  ALBUMIN 4.5   Recent Labs    01/21/17 1915  LIPASE 23   No results for input(s): AMMONIA in the last 8760 hours. CBC: Recent Labs    01/21/17 1915 01/25/17 0921  WBC 8.8 6.7  NEUTROABS 6.5 4.5  HGB 11.6* 11.6*  HCT 34.7* 34.5*  MCV 95.6 94.8  PLT 244 213    Lipid Panel: No results for input(s): CHOL, HDL, LDLCALC, TRIG, CHOLHDL, LDLDIRECT in the last 8760 hours. Lab Results  Component Value Date   HGBA1C 5.7 (H) 04/02/2016    Assessment/Plan 1. Overactive bladder -willing to try higher dose of mybetriq using current tablets, if helping she'll call for a 50mg  Rx - mirabegron ER (MYRBETRIQ) 25 MG TB24 tablet; Take 2 tablets (50 mg total) by mouth at bedtime. TAKE 1 TABLET DAILY  Dispense: 90 tablet; Refill: 1  2. Hypothyroidism due to acquired atrophy of thyroid -cont current levothyroxine and f/u lab: -  TSH; Future  3. Hyperglycemia -cont healthy diet and exercise, f/u lab - Hemoglobin A1c; Future  4. Primary osteoarthritis of both hands -worsening, encouraged tylenol use when bothersome and continued activity to prevent stiffness, if she is getting contractures, can refer for steroid injections  5. Hyperlipidemia, unspecified hyperlipidemia type - cont to work on diet, exercise and lipitor  - CBC with Differential/Platelet; Future - COMPLETE METABOLIC PANEL WITH GFR; Future - Lipid panel; Future  6. Nocturnal leg cramps -less bothersome lately, mustard helped  Labs/tests ordered:   Orders Placed This Encounter  Procedures  . CBC with Differential/Platelet    Standing Status:   Future    Standing Expiration Date:   05/26/2018  . COMPLETE METABOLIC PANEL WITH GFR    Standing Status:   Future    Standing Expiration Date:   05/26/2018  . Hemoglobin A1c    Standing Status:   Future    Standing Expiration Date:   05/26/2018  . Lipid panel    Standing Status:   Future    Standing Expiration Date:   05/26/2018  . TSH    Standing Status:   Future    Standing Expiration Date:   05/26/2018   Next appt:  6 mos CPE, labs before  Seddrick Flax L. Calen Geister, D.O. [facility] [address], [place] [address] Cell Phone (Mon-Fri 8am-5pm):  [phone] On Call:  [phone] & follow  prompts after 5pm & weekends Office Phone:  [phone] Office Fax:  [phone]

## 2017-05-26 NOTE — Patient Instructions (Signed)
Increase your myrbetriq to two tablets at bedtime.  Let me know if it helps and I'll call in a prescription for the 50mg  pills.    Merry Christmas!!!

## 2017-06-21 ENCOUNTER — Other Ambulatory Visit: Payer: Self-pay | Admitting: Family Medicine

## 2017-06-23 ENCOUNTER — Other Ambulatory Visit: Payer: Self-pay | Admitting: *Deleted

## 2017-06-23 MED ORDER — LEVOTHYROXINE SODIUM 25 MCG PO TABS: ORAL_TABLET | ORAL | 0 refills | Status: DC

## 2017-06-23 NOTE — Telephone Encounter (Signed)
[facility] 

## 2017-07-11 ENCOUNTER — Encounter: Payer: Self-pay | Admitting: Internal Medicine

## 2017-07-11 ENCOUNTER — Ambulatory Visit (INDEPENDENT_AMBULATORY_CARE_PROVIDER_SITE_OTHER): Payer: Medicare Other | Admitting: Internal Medicine

## 2017-07-11 VITALS — BP 148/70 | HR 73 | Temp 98.0°F | Wt 128.0 lb

## 2017-07-11 DIAGNOSIS — G3281 Cerebellar ataxia in diseases classified elsewhere: Secondary | ICD-10-CM

## 2017-07-11 DIAGNOSIS — N3281 Overactive bladder: Secondary | ICD-10-CM | POA: Diagnosis not present

## 2017-07-11 DIAGNOSIS — S0003XA Contusion of scalp, initial encounter: Secondary | ICD-10-CM

## 2017-07-11 DIAGNOSIS — W19XXXA Unspecified fall, initial encounter: Secondary | ICD-10-CM

## 2017-07-11 DIAGNOSIS — S0510XA Contusion of eyeball and orbital tissues, unspecified eye, initial encounter: Secondary | ICD-10-CM | POA: Diagnosis not present

## 2017-07-11 NOTE — Progress Notes (Signed)
Location:  [facility] clinic Provider: Kayon Dozier L. Mariea Clonts, D.O., C.M.D.  Code Status: DNR Goals of Care:  Advanced Directives 07/11/2017  Does Patient Have a Medical Advance Directive? Yes  Type of Paramedic of [place];Living will  Does patient want to make changes to medical advance directive? No - Patient declined  Copy of [place] in Chart? No - copy requested  Would patient like information on creating a medical advance directive? No - Patient declined   Chief Complaint  Patient presents with  . Acute Visit    patient fell 07/08/2017 friday  . ACP    requested copies    HPI: Patient is a 84 y.o. female seen today for an acute visit for fall with black eyes.  Was watching birds at the window.  They got scared and flew away, she stood up and she fell.  No dizziness, chest pain or sob.  She has fallen more ever since she fell and broke her ribs.  Had 2-3 small falls w/o injury.  She notices she "walks like a drunk" sometimes.  No other symptoms.  Even after this, she just had a little soreness where she hit her left frontal area and parietal area on the pine paneling on the wall.  Her eyes were so swollen, the right swelled closed so she could not see out of it.    Past Medical History:  Diagnosis Date  . Arthritis   . High cholesterol   . IBS (irritable bowel syndrome)   . Menopause   . Thyroid disease   . Tubular adenoma 01/13/2007    Past Surgical History:  Procedure Laterality Date  . biopsy left leg     still has scab on it pt states  . CATARACT EXTRACTION, BILATERAL  2012  . COLONOSCOPY    . TONSILLECTOMY      No Known Allergies  Outpatient Encounter Medications as of 07/11/2017  Medication Sig  . aspirin 81 MG tablet Take 81 mg by mouth daily after breakfast.   . atorvastatin (LIPITOR) 10 MG tablet Take 1 tablet (10 mg total) by mouth daily at 6 PM.  . Calcium Carb-Cholecalciferol (CALCIUM 1000 + D PO) Take 2 capsules by mouth 2  (two) times daily.   . cholecalciferol (VITAMIN D) 1000 UNITS tablet Take 1 tablet (1,000 Units total) by mouth daily.  Marland Kitchen levothyroxine (SYNTHROID, LEVOTHROID) 25 MCG tablet Take one tablet by mouth once daily 30 minutes before breakfast on empty stomach  . mirabegron ER (MYRBETRIQ) 25 MG TB24 tablet Take 2 tablets (50 mg total) by mouth at bedtime. TAKE 1 TABLET DAILY  . Multiple Vitamins-Minerals (MULTIVITAMIN WITH MINERALS) tablet Take 1 tablet by mouth daily after breakfast.   . polyethylene glycol (MIRALAX / GLYCOLAX) packet Take 17 g by mouth daily as needed for mild constipation or moderate constipation.    No facility-administered encounter medications on file as of 07/11/2017.     Review of Systems:  Review of Systems  Constitutional: Negative for chills, fever and malaise/fatigue.  HENT: Negative for congestion and hearing loss.   Eyes: Negative for blurred vision, double vision, photophobia, pain, discharge and redness.       Swelling and bruising around both eyes  Respiratory: Negative for cough and shortness of breath.   Cardiovascular: Negative for chest pain and palpitations.  Gastrointestinal: Negative for abdominal pain, blood in stool, constipation and melena.  Genitourinary: Positive for frequency and urgency. Negative for dysuria.  Still getting up at night and can't go back to sleep despite using myrbetriq  Musculoskeletal: Positive for falls and joint pain.  Neurological: Negative for dizziness, sensory change, speech change, focal weakness, loss of consciousness, weakness and headaches.       Gait like she's drunk sometimes, not constant  Endo/Heme/Allergies: Bruises/bleeds easily.  Psychiatric/Behavioral: Negative for depression and memory loss. The patient is not nervous/anxious.        Caregiver stress with husband who has dementia, she is primary caregiver    Health Maintenance  Topic Date Due  . TETANUS/TDAP  12/27/2018  . INFLUENZA VACCINE  Completed    . DEXA SCAN  Completed  . PNA vac Low Risk Adult  Completed  . MAMMOGRAM  Discontinued    Physical Exam: Vitals:   07/11/17 1142  BP: (!) 148/70  Pulse: 73  Temp: 98 F (36.7 C)  TempSrc: Oral  SpO2: 98%  Weight: 128 lb (58.1 kg)   Body mass index is 20.05 kg/m. Physical Exam  Constitutional: She is oriented to person, place, and time. She appears well-developed. No distress.  HENT:  Right Ear: External ear normal.  Left Ear: External ear normal.  Nose: Nose normal.  Mouth/Throat: Oropharynx is clear and moist. No oropharyngeal exudate.  golfball sized hematoma of left frontal lobe with small laceration, another ecchymoses on left superior parietal region; large "raccoon eyes" periorbital ecchymoses present; negative "battle sign"  Eyes: EOM are normal. Pupils are equal, round, and reactive to light.  glasses  Cardiovascular: Normal rate, regular rhythm, normal heart sounds and intact distal pulses.  No murmur heard. Pulmonary/Chest: Effort normal and breath sounds normal. No respiratory distress.  Abdominal: Soft. Bowel sounds are normal.  Musculoskeletal: Normal range of motion.  Neurological: She is alert and oriented to person, place, and time. No cranial nerve deficit. Coordination abnormal.  Ataxic gait intermittently, positive romberg  Skin: Skin is warm and dry. Capillary refill takes less than 2 seconds.  See HENT  Psychiatric: She has a normal mood and affect.    Labs reviewed: Basic Metabolic Panel: Recent Labs    01/21/17 1915 01/25/17 0921  NA 140 138  K 4.2 4.0  CL 104 103  CO2 27 29  GLUCOSE 112* 114*  BUN 24* 16  CREATININE 0.85 0.74  CALCIUM 9.7 9.2   Liver Function Tests: Recent Labs    01/21/17 1915  AST 34  ALT 26  ALKPHOS 47  BILITOT 1.1  PROT 8.1  ALBUMIN 4.5   Recent Labs    01/21/17 1915  LIPASE 23   No results for input(s): AMMONIA in the last 8760 hours. CBC: Recent Labs    01/21/17 1915 01/25/17 0921  WBC 8.8  6.7  NEUTROABS 6.5 4.5  HGB 11.6* 11.6*  HCT 34.7* 34.5*  MCV 95.6 94.8  PLT 244 213   Lipid Panel: No results for input(s): CHOL, HDL, LDLCALC, TRIG, CHOLHDL, LDLDIRECT in the last 8760 hours. Lab Results  Component Value Date   HGBA1C 5.7 (H) 04/02/2016    Assessment/Plan 1. Cerebellar ataxia in diseases classified elsewhere [facility]) - unusual intermittent ataxia, otherwise CN intact and no other deficits, large hematoma on frontal area and has raccoon eye sign which worries me--fall happened friday - CT Head Wo Contrast; Future  2. Overactive bladder -d/c myrbetriq in case it's contributing to her "ataxic gait"  3. Fall, initial encounter -with head strike -not syncope  4. Hematoma of frontal scalp, initial encounter -with small abrasion over it -use  ice if needed, but not that painful, she says  5. Periorbital contusion, unspecified laterality, initial encounter -swelling has come down some so she can open her eyes now, but had been so bad that right eye would not fully open and she could not see well -this is worrisome for intracranial bleeding considering she only struck her frontal area by report, not her face itself so eyeglasses did not do this and they are unscathed  Labs/tests ordered:  CT scan brain  Next appt:  11/22/2017  Tomasa Dobransky L. Jacquelin Krajewski, D.O. [facility] [address], [place] [address] Cell Phone (Mon-Fri 8am-5pm):  [phone] On Call:  [phone] & follow prompts after 5pm & weekends Office Phone:  [phone] Office Fax:  [phone]

## 2017-07-11 NOTE — Patient Instructions (Addendum)
Stop myrbetriq.  I'm concerned it's affecting your balance.   Get a CT scan of your brain due to your walking problem and "raccoon eyes"

## 2017-07-14 ENCOUNTER — Ambulatory Visit
Admission: RE | Admit: 2017-07-14 | Discharge: 2017-07-14 | Disposition: A | Discharge status: Home / Self Care | Payer: Medicare Other | Source: Ambulatory Visit | Attending: Internal Medicine | Admitting: Internal Medicine

## 2017-07-14 DIAGNOSIS — G3281 Cerebellar ataxia in diseases classified elsewhere: Secondary | ICD-10-CM

## 2017-09-25 ENCOUNTER — Other Ambulatory Visit: Payer: Self-pay | Admitting: Internal Medicine

## 2017-10-17 ENCOUNTER — Telehealth: Payer: Self-pay | Admitting: Internal Medicine

## 2017-10-17 NOTE — Telephone Encounter (Signed)
I called the pt to schedule AWV, but there was no answer and no option to leave a message. VDM (DD) °

## 2017-10-19 NOTE — Telephone Encounter (Signed)
I called the patient to schedule AWV, but she wasn't home.  I will try again later. VDM (DD)

## 2017-10-24 ENCOUNTER — Other Ambulatory Visit: Payer: Self-pay | Admitting: Internal Medicine

## 2017-11-21 ENCOUNTER — Other Ambulatory Visit: Payer: Self-pay

## 2017-11-21 DIAGNOSIS — R739 Hyperglycemia, unspecified: Secondary | ICD-10-CM

## 2017-11-21 DIAGNOSIS — E034 Atrophy of thyroid (acquired): Secondary | ICD-10-CM

## 2017-11-21 DIAGNOSIS — E785 Hyperlipidemia, unspecified: Secondary | ICD-10-CM

## 2017-11-22 ENCOUNTER — Other Ambulatory Visit: Payer: Medicare Other

## 2017-11-23 LAB — COMPLETE METABOLIC PANEL WITH GFR
AG Ratio: 1.1 (calc) (ref 1.0–2.5)
ALT: 23 U/L (ref 6–29)
AST: 29 U/L (ref 10–35)
Albumin: 4 g/dL (ref 3.6–5.1)
Alkaline phosphatase (APISO): 55 U/L (ref 33–130)
BUN: 18 mg/dL (ref 7–25)
CO2: 29 mmol/L (ref 20–32)
Calcium: 9.5 mg/dL (ref 8.6–10.4)
Chloride: 104 mmol/L (ref 98–110)
Creat: 0.86 mg/dL (ref 0.60–0.88)
GFR, Est African American: 72 mL/min/{1.73_m2} (ref >=60)
GFR, Est Non African American: 62 mL/min/{1.73_m2} (ref >=60)
Globulin: 3.5 g/dL (calc) (ref 1.9–3.7)
Glucose, Bld: 98 mg/dL (ref 65–99)
Potassium: 4.1 mmol/L (ref 3.5–5.3)
Sodium: 140 mmol/L (ref 135–146)
Total Bilirubin: 1.1 mg/dL (ref 0.2–1.2)
Total Protein: 7.5 g/dL (ref 6.1–8.1)

## 2017-11-23 LAB — CBC WITH DIFFERENTIAL/PLATELET
Basophils Absolute: 51 cells/uL (ref 0–200)
Basophils Relative: 1 %
Eosinophils Absolute: 270 cells/uL (ref 15–500)
Eosinophils Relative: 5.3 %
HCT: 35 % (ref 35.0–45.0)
Hemoglobin: 12 g/dL (ref 11.7–15.5)
Lymphs Abs: 1433 cells/uL (ref 850–3900)
MCH: 31.8 pg (ref 27.0–33.0)
MCHC: 34.3 g/dL (ref 32.0–36.0)
MCV: 92.8 fL (ref 80.0–100.0)
MPV: 10.1 fL (ref 7.5–12.5)
Monocytes Relative: 9.4 %
Neutro Abs: 2866 cells/uL (ref 1500–7800)
Neutrophils Relative %: 56.2 %
Platelets: 226 10*3/uL (ref 140–400)
RBC: 3.77 10*6/uL — ABNORMAL LOW (ref 3.80–5.10)
RDW: 12.1 % (ref 11.0–15.0)
Total Lymphocyte: 28.1 %
WBC mixed population: 479 cells/uL (ref 200–950)
WBC: 5.1 10*3/uL (ref 3.8–10.8)

## 2017-11-23 LAB — LIPID PANEL
Cholesterol: 138 mg/dL (ref <200)
HDL: 50 mg/dL — ABNORMAL LOW (ref >50)
LDL Cholesterol (Calc): 70 mg/dL (calc)
Non-HDL Cholesterol (Calc): 88 mg/dL (calc) (ref <130)
Total CHOL/HDL Ratio: 2.8 (calc) (ref <5.0)
Triglycerides: 101 mg/dL (ref <150)

## 2017-11-23 LAB — TSH: TSH: 3.34 mIU/L (ref 0.40–4.50)

## 2017-11-23 LAB — HEMOGLOBIN A1C
Hgb A1c MFr Bld: 5.6 % of total Hgb (ref <5.7)
Mean Plasma Glucose: 114 (calc)
eAG (mmol/L): 6.3 (calc)

## 2017-11-24 ENCOUNTER — Ambulatory Visit (INDEPENDENT_AMBULATORY_CARE_PROVIDER_SITE_OTHER): Payer: Medicare Other | Admitting: Internal Medicine

## 2017-11-24 ENCOUNTER — Encounter: Payer: Self-pay | Admitting: Internal Medicine

## 2017-11-24 VITALS — BP 110/58 | HR 80 | Temp 98.0°F | Ht 67.0 in | Wt 123.2 lb

## 2017-11-24 DIAGNOSIS — E785 Hyperlipidemia, unspecified: Secondary | ICD-10-CM

## 2017-11-24 DIAGNOSIS — Z23 Encounter for immunization: Secondary | ICD-10-CM

## 2017-11-24 DIAGNOSIS — R2681 Unsteadiness on feet: Secondary | ICD-10-CM | POA: Diagnosis not present

## 2017-11-24 DIAGNOSIS — J3089 Other allergic rhinitis: Secondary | ICD-10-CM | POA: Diagnosis not present

## 2017-11-24 DIAGNOSIS — F439 Reaction to severe stress, unspecified: Secondary | ICD-10-CM | POA: Diagnosis not present

## 2017-11-24 DIAGNOSIS — E034 Atrophy of thyroid (acquired): Secondary | ICD-10-CM | POA: Diagnosis not present

## 2017-11-24 DIAGNOSIS — M7989 Other specified soft tissue disorders: Secondary | ICD-10-CM

## 2017-11-24 DIAGNOSIS — R739 Hyperglycemia, unspecified: Secondary | ICD-10-CM | POA: Diagnosis not present

## 2017-11-24 DIAGNOSIS — Z0000 Encounter for general adult medical examination without abnormal findings: Secondary | ICD-10-CM | POA: Diagnosis not present

## 2017-11-24 MED ORDER — LORAZEPAM 0.5 MG PO TABS: 0.5000 mg | ORAL_TABLET | Freq: Two times a day (BID) | ORAL | 0 refills | Status: DC | PRN

## 2017-11-24 MED ORDER — LEVOTHYROXINE SODIUM 25 MCG PO TABS: ORAL_TABLET | ORAL | 3 refills | Status: DC

## 2017-11-24 MED ORDER — VITAMIN D 1000 UNITS PO TABS: 2000.0000 [IU] | ORAL_TABLET | Freq: Every day | ORAL | 3 refills | Status: DC

## 2017-11-24 MED ORDER — ZOSTER VAC RECOMB ADJUVANTED 50 MCG/0.5ML IM SUSR: 0.5000 mL | Freq: Once | INTRAMUSCULAR | 1 refills | Status: AC
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
## 2017-12-05 ENCOUNTER — Other Ambulatory Visit: Payer: Self-pay | Admitting: Internal Medicine

## 2017-12-05 DIAGNOSIS — Z23 Encounter for immunization: Secondary | ICD-10-CM

## 2017-12-05 MED ORDER — ZOSTER VAC RECOMB ADJUVANTED 50 MCG/0.5ML IM SUSR: 0.5000 mL | Freq: Once | INTRAMUSCULAR | 1 refills | Status: AC

## 2017-12-20 ENCOUNTER — Other Ambulatory Visit: Payer: Self-pay | Admitting: Internal Medicine

## 2017-12-20 DIAGNOSIS — E034 Atrophy of thyroid (acquired): Secondary | ICD-10-CM

## 2018-02-03 ENCOUNTER — Telehealth: Payer: Self-pay | Admitting: Internal Medicine

## 2018-02-03 NOTE — Telephone Encounter (Signed)
I called the pt to schedule AWV.  Her husband asked that I call back in a couple of hours. VDM (DD)

## 2018-02-21 NOTE — Telephone Encounter (Signed)
I called the patient to schedule AWV, but there was no answer and no option to leave a message. VDM (DD) °

## 2018-03-20 ENCOUNTER — Other Ambulatory Visit: Payer: Self-pay | Admitting: Internal Medicine

## 2018-03-20 DIAGNOSIS — E034 Atrophy of thyroid (acquired): Secondary | ICD-10-CM

## 2018-03-29 ENCOUNTER — Ambulatory Visit (INDEPENDENT_AMBULATORY_CARE_PROVIDER_SITE_OTHER): Payer: Medicare Other

## 2018-03-29 VITALS — BP 140/70 | HR 70 | Temp 97.7°F | Ht 67.0 in | Wt 122.0 lb

## 2018-03-29 DIAGNOSIS — R2681 Unsteadiness on feet: Secondary | ICD-10-CM

## 2018-03-29 DIAGNOSIS — Z0000 Encounter for general adult medical examination without abnormal findings: Secondary | ICD-10-CM | POA: Diagnosis not present

## 2018-03-29 MED ORDER — VITAMIN D 1000 UNITS PO TABS: 2000.0000 [IU] | ORAL_TABLET | Freq: Every day | ORAL | 3 refills | Status: DC

## 2018-03-29 MED ORDER — ZOSTER VAC RECOMB ADJUVANTED 50 MCG/0.5ML IM SUSR: 0.5000 mL | Freq: Once | INTRAMUSCULAR | 1 refills | Status: AC

## 2018-03-29 NOTE — Patient Instructions (Addendum)
Tara Harris , Thank you for taking time to come for your Medicare Wellness Visit. I appreciate your ongoing commitment to your health goals. Please review the following plan we discussed and let me know if I can assist you in the future.   Screening recommendations/referrals: Colonoscopy excluded, over age 82 Mammogram excluded, over age 51 Bone Density up to date Recommended yearly ophthalmology/optometry visit for glaucoma screening and checkup Recommended yearly dental visit for hygiene and checkup  Vaccinations: Influenza vaccine up to date Pneumococcal vaccine up to date, completed Tdap vaccine up to date, due 12/27/2018 Shingles vaccine due, ordered    Advanced directives: Please bring [place] a copy of you  Conditions/risks identified: none  Next appointment:Dr. Mariea Clonts 05/22/2018 @ 10:30am           Tyson Dense, RN 04/02/2019 @ 10am   Preventive Care 65 Years and Older, Female Preventive care refers to lifestyle choices and visits with your health care provider that can promote health and wellness. What does preventive care include?  A yearly physical exam. This is also called an annual well check.  Dental exams once or twice a year.  Routine eye exams. Ask your health care provider how often you should have your eyes checked.  Personal lifestyle choices, including:  Daily care of your teeth and gums.  Regular physical activity.  Eating a healthy diet.  Avoiding tobacco and drug use.  Limiting alcohol use.  Practicing safe sex.  Taking low-dose aspirin every day.  Taking vitamin and mineral supplements as recommended by your health care provider. What happens during an annual well check? The services and screenings done by your health care provider during your annual well check will depend on your age, overall health, lifestyle risk factors, and family history of disease. Counseling  Your health care provider may ask you questions about your:  Alcohol  use.  Tobacco use.  Drug use.  Emotional well-being.  Home and relationship well-being.  Sexual activity.  Eating habits.  History of falls.  Memory and ability to understand (cognition).  Work and work Statistician.  Reproductive health. Screening  You may have the following tests or measurements:  Height, weight, and BMI.  Blood pressure.  Lipid and cholesterol levels. These may be checked every 5 years, or more frequently if you are over 88 years old.  Skin check.  Lung cancer screening. You may have this screening every year starting at age 45 if you have a 30-pack-year history of smoking and currently smoke or have quit within the past 15 years.  Fecal occult blood test (FOBT) of the stool. You may have this test every year starting at age 25.  Flexible sigmoidoscopy or colonoscopy. You may have a sigmoidoscopy every 5 years or a colonoscopy every 10 years starting at age 23.  Hepatitis C blood test.  Hepatitis B blood test.  Sexually transmitted disease (STD) testing.  Diabetes screening. This is done by checking your blood sugar (glucose) after you have not eaten for a while (fasting). You may have this done every 1-3 years.  Bone density scan. This is done to screen for osteoporosis. You may have this done starting at age 37.  Mammogram. This may be done every 1-2 years. Talk to your health care provider about how often you should have regular mammograms. Talk with your health care provider about your test results, treatment options, and if necessary, the need for more tests. Vaccines  Your health care provider may recommend certain vaccines, such as:  Influenza vaccine. This is recommended every year.  Tetanus, diphtheria, and acellular pertussis (Tdap, Td) vaccine. You may need a Td booster every 10 years.  Zoster vaccine. You may need this after age 17.  Pneumococcal 13-valent conjugate (PCV13) vaccine. One dose is recommended after age  66.  Pneumococcal polysaccharide (PPSV23) vaccine. One dose is recommended after age 72. Talk to your health care provider about which screenings and vaccines you need and how often you need them. This information is not intended to replace advice given to you by your health care provider. Make sure you discuss any questions you have with your health care provider. Document Released: 06/20/2015 Document Revised: 02/11/2016 Document Reviewed: 03/25/2015 Elsevier Interactive Patient Education  2017 [place] Prevention in the Home Falls can cause injuries. They can happen to people of all ages. There are many things you can do to make your home safe and to help prevent falls. What can I do on the outside of my home?  Regularly fix the edges of walkways and driveways and fix any cracks.  Remove anything that might make you trip as you walk through a door, such as a raised step or threshold.  Trim any bushes or trees on the path to your home.  Use bright outdoor lighting.  Clear any walking paths of anything that might make someone trip, such as rocks or tools.  Regularly check to see if handrails are loose or broken. Make sure that both sides of any steps have handrails.  Any raised decks and porches should have guardrails on the edges.  Have any leaves, snow, or ice cleared regularly.  Use sand or salt on walking paths during winter.  Clean up any spills in your garage right away. This includes oil or grease spills. What can I do in the bathroom?  Use night lights.  Install grab bars by the toilet and in the tub and shower. Do not use towel bars as grab bars.  Use non-skid mats or decals in the tub or shower.  If you need to sit down in the shower, use a plastic, non-slip stool.  Keep the floor dry. Clean up any water that spills on the floor as soon as it happens.  Remove soap buildup in the tub or shower regularly.  Attach bath mats securely with double-sided  non-slip rug tape.  Do not have throw rugs and other things on the floor that can make you trip. What can I do in the bedroom?  Use night lights.  Make sure that you have a light by your bed that is easy to reach.  Do not use any sheets or blankets that are too big for your bed. They should not hang down onto the floor.  Have a firm chair that has side arms. You can use this for support while you get dressed.  Do not have throw rugs and other things on the floor that can make you trip. What can I do in the kitchen?  Clean up any spills right away.  Avoid walking on wet floors.  Keep items that you use a lot in easy-to-reach places.  If you need to reach something above you, use a strong step stool that has a grab bar.  Keep electrical cords out of the way.  Do not use floor polish or wax that makes floors slippery. If you must use wax, use non-skid floor wax.  Do not have throw rugs and other things on the floor that  can make you trip. What can I do with my stairs?  Do not leave any items on the stairs.  Make sure that there are handrails on both sides of the stairs and use them. Fix handrails that are broken or loose. Make sure that handrails are as long as the stairways.  Check any carpeting to make sure that it is firmly attached to the stairs. Fix any carpet that is loose or worn.  Avoid having throw rugs at the top or bottom of the stairs. If you do have throw rugs, attach them to the floor with carpet tape.  Make sure that you have a light switch at the top of the stairs and the bottom of the stairs. If you do not have them, ask someone to add them for you. What else can I do to help prevent falls?  Wear shoes that:  Do not have high heels.  Have rubber bottoms.  Are comfortable and fit you well.  Are closed at the toe. Do not wear sandals.  If you use a stepladder:  Make sure that it is fully opened. Do not climb a closed stepladder.  Make sure that both  sides of the stepladder are locked into place.  Ask someone to hold it for you, if possible.  Clearly mark and make sure that you can see:  Any grab bars or handrails.  First and last steps.  Where the edge of each step is.  Use tools that help you move around (mobility aids) if they are needed. These include:  Canes.  Walkers.  Scooters.  Crutches.  Turn on the lights when you go into a dark area. Replace any light bulbs as soon as they burn out.  Set up your furniture so you have a clear path. Avoid moving your furniture around.  If any of your floors are uneven, fix them.  If there are any pets around you, be aware of where they are.  Review your medicines with your doctor. Some medicines can make you feel dizzy. This can increase your chance of falling. Ask your doctor what other things that you can do to help prevent falls. This information is not intended to replace advice given to you by your health care provider. Make sure you discuss any questions you have with your health care provider. Document Released: 03/20/2009 Document Revised: 10/30/2015 Document Reviewed: 06/28/2014 Elsevier Interactive Patient Education  2017 [employer].

## 2018-03-29 NOTE — Addendum Note (Signed)
Addended by: Tyson Dense E on: 03/29/2018 10:20 AM   Modules accepted: Orders

## 2018-03-29 NOTE — Progress Notes (Addendum)
Subjective:   Tara Harris is a 84 y.o. female who presents for Medicare Annual (Subsequent) preventive examination.  Last AWV-10/20/2016    Objective:     Vitals: BP 140/70 (BP Location: Right Arm, Patient Position: Sitting)   Pulse 70   Temp 97.7 F (36.5 C) (Oral)   Ht 5\' 7"  (1.702 m)   Wt 122 lb (55.3 kg)   SpO2 98%   BMI 19.11 kg/m   Body mass index is 19.11 kg/m.  Advanced Directives 03/29/2018 07/11/2017 05/26/2017 10/20/2016 04/05/2016 01/01/2016 07/03/2015  Does Patient Have a Medical Advance Directive? Yes Yes No Yes Yes No Yes  Type of Paramedic of [place];Living will [place];Living will - [place];Living will - - Living will;Healthcare Power of Attorney  Does patient want to make changes to medical advance directive? No - Patient declined No - Patient declined - No - Patient declined - - No - Patient declined  Copy of [place] in Chart? No - copy requested No - copy requested - No - copy requested No - copy requested - No - copy requested  Would patient like information on creating a medical advance directive? - No - Patient declined No - Patient declined - - No - patient declined information -    Tobacco Social History   Tobacco Use  Smoking Status Never Smoker  Smokeless Tobacco Never Used     Counseling given: Not Answered   Clinical Intake:  Pre-visit preparation completed: No  Pain : 0-10 Pain Score: 2  Pain Type: Chronic pain Pain Location: Back Pain Orientation: Lower Pain Descriptors / Indicators: Aching Pain Onset: More than a month ago Pain Frequency: Intermittent     Diabetes: No  How often do you need to have someone help you when you read instructions, pamphlets, or other written materials from your doctor or pharmacy?: 2 - Rarely What is the last grade level you completed in school?: high school  Interpreter Needed?: No  Information entered by ::  Tyson Dense, RN  Past Medical History:  Diagnosis Date  . Arthritis   . High cholesterol   . IBS (irritable bowel syndrome)   . Menopause   . Thyroid disease   . Tubular adenoma 01/13/2007   Past Surgical History:  Procedure Laterality Date  . biopsy left leg     still has scab on it pt states  . CATARACT EXTRACTION, BILATERAL  2012  . COLONOSCOPY    . TONSILLECTOMY     Family History  Problem Relation Age of Onset  . Alzheimer's disease Mother   . Heart attack Father   . Alzheimer's disease Father   . Heart attack Sister   . Kidney disease Brother   . Colon cancer Neg Hx   . Stomach cancer Neg Hx    Social History   Socioeconomic History  . Marital status: Married    Spouse name: Not on file  . Number of children: Not on file  . Years of education: Not on file  . Highest education level: Not on file  Occupational History  . Not on file  Social Needs  . Financial resource strain: Not hard at all  . Food insecurity:    Worry: Never true    Inability: Never true  . Transportation needs:    Medical: No    Non-medical: No  Tobacco Use  . Smoking status: Never Smoker  . Smokeless tobacco: Never Used  Substance  and Sexual Activity  . Alcohol use: No  . Drug use: No  . Sexual activity: Not on file  Lifestyle  . Physical activity:    Days per week: 6 days    Minutes per session: 30 min  . Stress: To some extent  Relationships  . Social connections:    Talks on phone: Three times a week    Gets together: Once a week    Attends religious service: More than 4 times per year    Active member of club or organization: No    Attends meetings of clubs or organizations: Never    Relationship status: Married  Other Topics Concern  . Not on file  Social History Narrative   Diet:      Do you drink/ eat things with caffeine? Yes      Marital status:  Married                             What year were you married ? 1961      Do you live in a house,  apartment,assistred living, condo, trailer, etc.)? House      Is it one or more stories? One story       How many persons live in your home ? Two      Do you have any pets in your home ?(please list) 1 Dog 1 Cat      Current or past profession:  [employer], Telephone Operator       Do you exercise?  Yes                            Type & how often: Garden and Yard work      Do you have a living will? Yes      Do you have a DNR form?   Yes                    If not, do you want to discuss one?       Do you have signed POA?HPOA forms?                 If so, please bring to your        appointment       Outpatient Encounter Medications as of 03/29/2018  Medication Sig  . aspirin 81 MG tablet Take 81 mg by mouth daily after breakfast.   . atorvastatin (LIPITOR) 10 MG tablet TAKE 1 TABLET DAILY AT 6 P.M.  . Calcium Carb-Cholecalciferol (CALCIUM 1000 + D PO) Take 2 capsules by mouth 2 (two) times daily.   . cholecalciferol (VITAMIN D) 1000 units tablet Take 2 tablets (2,000 Units total) by mouth daily.  Marland Kitchen levothyroxine (SYNTHROID, LEVOTHROID) 25 MCG tablet TAKE 1 TABLET DAILY 30 MINUTES BEFORE BREAKFAST ON AN EMPTY STOMACH  . Multiple Vitamins-Minerals (MULTIVITAMIN WITH MINERALS) tablet Take 1 tablet by mouth daily after breakfast.   . polyethylene glycol (MIRALAX / GLYCOLAX) packet Take 17 g by mouth daily as needed for mild constipation or moderate constipation.   . Zoster Vaccine Adjuvanted [facility]) injection Inject 0.5 mLs into the muscle once for 1 dose.  . [DISCONTINUED] cholecalciferol (VITAMIN D) 1000 units tablet Take 2 tablets (2,000 Units total) by mouth daily.  . [DISCONTINUED] Zoster Vaccine Adjuvanted [facility]) injection Inject 0.5 mLs into the muscle once.  Marland Kitchen LORazepam (ATIVAN) 0.5 MG tablet  Take 1 tablet (0.5 mg total) by mouth 2 (two) times daily as needed for anxiety. And stress   No facility-administered encounter medications on file as of 03/29/2018.      Activities of Daily Living In your present state of health, do you have any difficulty performing the following activities: 03/29/2018  Hearing? N  Vision? N  Difficulty concentrating or making decisions? N  Walking or climbing stairs? N  Dressing or bathing? N  Doing errands, shopping? N  Preparing Food and eating ? N  Using the Toilet? N  In the past six months, have you accidently leaked urine? Y  Do you have problems with loss of bowel control? Y  Managing your Medications? N  Managing your Finances? N  Housekeeping or managing your Housekeeping? N  Some recent data might be hidden    Patient Care Team: [name], DO as PCP - General (Geriatric Medicine) Rutherford Guys, MD as Consulting Physician (Ophthalmology) Druscilla Brownie, MD as Consulting Physician (Dermatology)    Assessment:   This is a routine wellness examination for [place].  Exercise Activities and Dietary recommendations Current Exercise Habits: Home exercise routine, Type of exercise: Other - see comments(yard work), Time (Minutes): 30, Frequency (Times/Week): 6, Weekly Exercise (Minutes/Week): 180, Exercise limited by: None identified  Goals    . Conscious of diet     Starting today pt will continue having a balanced diet.       Fall Risk Fall Risk  03/29/2018 11/24/2017 07/11/2017 05/26/2017 01/27/2017  Falls in the past year? No Yes Yes Yes Yes  Number falls in past yr: - 2 or more - - 1  Injury with Fall? - No Yes - Yes  Comment - - - - Broken Ribs    Is the patient's home free of loose throw rugs in walkways, pet beds, electrical cords, etc?   yes      Grab bars in the bathroom? yes      Handrails on the stairs?   yes      Adequate lighting?   yes  Depression Screen PHQ 2/9 Scores 03/29/2018 07/11/2017 05/26/2017 10/20/2016  PHQ - 2 Score 0 0 0 0     Cognitive Function MMSE - Mini Mental State Exam 03/29/2018 10/20/2016 07/03/2015  Not completed: - - (No Data)  Orientation to time 5  5 5   Orientation to Place 5 5 5   Registration 3 3 3   Attention/ Calculation 5 5 4   Recall 2 1 3   Language- name 2 objects 2 2 2   Language- repeat 1 1 1   Language- follow 3 step command 3 3 3   Language- read & follow direction 1 1 1   Write a sentence 1 1 1   Copy design 1 1 0  Total score 29 28 28         Immunization History  Administered Date(s) Administered  . Influenza Split 03/13/2012  . Influenza Whole 03/17/2007, 02/26/2013  . Influenza, High Dose Seasonal PF 02/05/2014, 03/03/2016, 03/15/2017  . Influenza,inj,Quad PF,6+ Mos 02/20/2015  . Influenza-Unspecified 02/05/2018  . Pneumococcal Conjugate-13 05/21/2014  . Pneumococcal Polysaccharide-23 02/06/2007  . Td 06/07/1998, 12/26/2008  . Zoster 02/06/2007    Qualifies for Shingles Vaccine? Yes, educated and ordered to pharmacy  Screening Tests Health Maintenance  Topic Date Due  . Samul Dada  12/27/2018  . INFLUENZA VACCINE  Completed  . DEXA SCAN  Completed  . PNA vac Low Risk Adult  Completed  . MAMMOGRAM  Discontinued    Cancer Screenings: Lung:  Low Dose CT Chest recommended if Age 94-80 years, 30 pack-year currently smoking OR have quit w/in 15years. Patient does not qualify. Breast:  Up to date on Mammogram? Yes   Up to date of Bone Density/Dexa? No Colorectal: up to date  Additional Screenings:  Hepatitis C Screening: declined      Plan:    I have personally reviewed and addressed the Medicare Annual Wellness questionnaire and have noted the following in the patient's chart:  A. Medical and social history B. Use of alcohol, tobacco or illicit drugs  C. Current medications and supplements D. Functional ability and status E.  Nutritional status F.  Physical activity G. Advance directives H. List of other physicians I.  Hospitalizations, surgeries, and ER visits in previous 12 months J.  [place] to include hearing, vision, cognitive, depression L. Referrals and appointments -  none  In addition, I have reviewed and discussed with patient certain preventive protocols, quality metrics, and best practice recommendations. A written personalized care plan for preventive services as well as general preventive health recommendations were provided to patient.  See attached scanned questionnaire for additional information.   Signed,   Tyson Dense, RN Nurse Health Advisor  Patient Concerns: None

## 2018-05-22 ENCOUNTER — Encounter: Payer: Self-pay | Admitting: Internal Medicine

## 2018-05-22 ENCOUNTER — Ambulatory Visit (INDEPENDENT_AMBULATORY_CARE_PROVIDER_SITE_OTHER): Payer: Medicare Other | Admitting: Internal Medicine

## 2018-05-22 VITALS — BP 120/70 | HR 69 | Temp 98.0°F | Ht 67.0 in | Wt 121.0 lb

## 2018-05-22 DIAGNOSIS — R269 Unspecified abnormalities of gait and mobility: Secondary | ICD-10-CM | POA: Insufficient documentation

## 2018-05-22 DIAGNOSIS — N3941 Urge incontinence: Secondary | ICD-10-CM

## 2018-05-22 DIAGNOSIS — F439 Reaction to severe stress, unspecified: Secondary | ICD-10-CM | POA: Diagnosis not present

## 2018-05-22 DIAGNOSIS — M810 Age-related osteoporosis without current pathological fracture: Secondary | ICD-10-CM | POA: Diagnosis not present

## 2018-05-22 DIAGNOSIS — I499 Cardiac arrhythmia, unspecified: Secondary | ICD-10-CM | POA: Diagnosis not present

## 2018-05-22 DIAGNOSIS — R739 Hyperglycemia, unspecified: Secondary | ICD-10-CM

## 2018-05-22 DIAGNOSIS — R2681 Unsteadiness on feet: Secondary | ICD-10-CM

## 2018-05-22 DIAGNOSIS — E034 Atrophy of thyroid (acquired): Secondary | ICD-10-CM

## 2018-05-22 DIAGNOSIS — Z23 Encounter for immunization: Secondary | ICD-10-CM

## 2018-05-22 DIAGNOSIS — E785 Hyperlipidemia, unspecified: Secondary | ICD-10-CM

## 2018-05-22 MED ORDER — ZOSTER VAC RECOMB ADJUVANTED 50 MCG/0.5ML IM SUSR: 0.5000 mL | Freq: Once | INTRAMUSCULAR | 1 refills | Status: AC

## 2018-05-22 MED ORDER — VITAMIN D 50 MCG (2000 UT) PO CAPS: 1.0000 | ORAL_CAPSULE | Freq: Every day | ORAL | 5 refills | Status: DC

## 2018-05-22 MED ORDER — LORAZEPAM 0.5 MG PO TABS: 0.5000 mg | ORAL_TABLET | Freq: Two times a day (BID) | ORAL | 0 refills | Status: DC | PRN

## 2018-05-22 NOTE — Progress Notes (Signed)
Location:  [facility] Provider:  Trigg Delarocha L. Mariea Clonts, D.O., C.M.D.  Code Status: DNR Goals of Care:  Advanced Directives 03/29/2018  Does Patient Have a Medical Advance Directive? Yes  Type of Paramedic of [place];Living will  Does patient want to make changes to medical advance directive? No - Patient declined  Copy of [place] in Chart? No - copy requested  Would patient like information on creating a medical advance directive? -   Chief Complaint  Patient presents with  . Medical Management of Chronic Issues    33mth follow-up    HPI: Patient is a 84 y.o. female seen today for medical management of chronic diseases.    She's been falling an awful lot here lately.  She does not getting dizzy, she just falls.  Fortunately, it has not been serious.  [place] and got a bad abrasion of her left elbow 2 mos ago and scab just came off this last week.  She was getting out of the car and leg on the ground gave way as she tried to walk.  She cleansed it initially with iodine and then it kept bleeding a lot for 3-4 days.  Then it stopped and gradually healed.    She fell again 4 days ago.  Then a couple of days ago, she was going down the steps and fell into the yard where it was soft.  She's not wearing high heels--does do medium clunky heels or wedge only.  One fall did happen in heels, the other in flats.    No chest pain, sob, palpitations, nausea, just happens.  Sees well.  Does not notice numbness or tingling of her feet.  They are cold.  She loves to eat and eats consistently including sweets.   She thinks she is in a hurry to do what she needs to do and gets careless.  She doesn't think there's anything truly wrong with her.  She stays on th go all of the time.   No having pain.  Hips have straightened out.    Says her mind is getting worse--she could not think of her brother's name--he'd been her favorite.  She does  struggle to remember appts.  Keeps track of her meds well with a list and checks off.  She missed her hair appt.  MMSE - Mini Mental State Exam 03/29/2018 10/20/2016 07/03/2015  Not completed: - - (No Data)  Orientation to time 5 5 5   Orientation to Place 5 5 5   Registration 3 3 3   Attention/ Calculation 5 5 4   Recall 2 1 3   Language- name 2 objects 2 2 2   Language- repeat 1 1 1   Language- follow 3 step command 3 3 3   Language- read & follow direction 1 1 1   Write a sentence 1 1 1   Copy design 1 1 0  Total score 29 28 28    She says she's short-tempered with her husband (who has dementia) quite often and she gets stressed and overwhelmed.  Uses lorazepam rarely.    Past Medical History:  Diagnosis Date  . Arthritis   . High cholesterol   . IBS (irritable bowel syndrome)   . Menopause   . Thyroid disease   . Tubular adenoma 01/13/2007    Past Surgical History:  Procedure Laterality Date  . biopsy left leg     still has scab on it pt states  . CATARACT EXTRACTION, BILATERAL  2012  .  COLONOSCOPY    . TONSILLECTOMY      No Known Allergies  Outpatient Encounter Medications as of 05/22/2018  Medication Sig  . aspirin 81 MG tablet Take 81 mg by mouth daily after breakfast.   . atorvastatin (LIPITOR) 10 MG tablet TAKE 1 TABLET DAILY AT 6 P.M.  . Calcium Carb-Cholecalciferol (CALCIUM 1000 + D PO) Take 2 capsules by mouth 2 (two) times daily.   . cholecalciferol (VITAMIN D) 1000 units tablet Take 2 tablets (2,000 Units total) by mouth daily.  Marland Kitchen levothyroxine (SYNTHROID, LEVOTHROID) 25 MCG tablet TAKE 1 TABLET DAILY 30 MINUTES BEFORE BREAKFAST ON AN EMPTY STOMACH  . LORazepam (ATIVAN) 0.5 MG tablet Take 1 tablet (0.5 mg total) by mouth 2 (two) times daily as needed for anxiety. And stress  . Multiple Vitamins-Minerals (MULTIVITAMIN WITH MINERALS) tablet Take 1 tablet by mouth daily after breakfast.   . polyethylene glycol (MIRALAX / GLYCOLAX) packet Take 17 g by mouth daily as needed  for mild constipation or moderate constipation.    No facility-administered encounter medications on file as of 05/22/2018.     Review of Systems:  Review of Systems  Constitutional: Positive for weight loss. Negative for chills, fever and malaise/fatigue.       Only down 1 more lb--was wanting to lose weight for a while--is now happy where she is  HENT: Negative for congestion and hearing loss.   Eyes: Negative for blurred vision.       Glasses  Respiratory: Negative for shortness of breath.   Cardiovascular: Negative for chest pain, palpitations and leg swelling.  Gastrointestinal: Positive for constipation. Negative for abdominal pain, blood in stool, melena, nausea and vomiting.  Genitourinary: Positive for urgency. Negative for dysuria, frequency and hematuria.       Leakage worse   Musculoskeletal: Positive for falls. Negative for back pain, joint pain and myalgias.  Skin: Negative for itching and rash.  Neurological: Negative for dizziness, tingling, sensory change, loss of consciousness and headaches.  Psychiatric/Behavioral: Positive for memory loss. Negative for depression. The patient is nervous/anxious. The patient does not have insomnia.        Reports memory loss (see hpi)    Health Maintenance  Topic Date Due  . TETANUS/TDAP  12/27/2018  . INFLUENZA VACCINE  Completed  . DEXA SCAN  Completed  . PNA vac Low Risk Adult  Completed  . MAMMOGRAM  Discontinued    Physical Exam: Vitals:   05/22/18 1021  BP: 120/70  Pulse: 69  Temp: 98 F (36.7 C)  TempSrc: Oral  SpO2: 97%  Weight: 121 lb (54.9 kg)  Height: 5\' 7"  (1.702 m)   Body mass index is 18.95 kg/m. Physical Exam Vitals signs reviewed.  Constitutional:      General: She is not in acute distress.    Appearance: Normal appearance. She is not ill-appearing, toxic-appearing or diaphoretic.  HENT:     Head: Normocephalic and atraumatic.  Eyes:     Extraocular Movements: Extraocular movements intact.      Pupils: Pupils are equal, round, and reactive to light.  Neck:     Musculoskeletal: Normal range of motion.  Cardiovascular:     Rate and Rhythm: Normal rate. Rhythm irregular.     Pulses: Normal pulses.     Heart sounds: No murmur. No friction rub. No gallop.   Pulmonary:     Effort: Pulmonary effort is normal.     Breath sounds: Normal breath sounds. No wheezing or rhonchi.  Musculoskeletal: Normal range  of motion.  Skin:    General: Skin is warm and dry.     Capillary Refill: Capillary refill takes less than 2 seconds.  Neurological:     General: No focal deficit present.     Mental Status: She is alert and oriented to person, place, and time.     Cranial Nerves: No cranial nerve deficit.     Sensory: No sensory deficit.     Motor: No weakness.     Coordination: Coordination normal.     Gait: Gait normal.     Deep Tendon Reflexes: Reflexes normal.  Psychiatric:        Mood and Affect: Mood normal.        Behavior: Behavior normal.        Thought Content: Thought content normal.        Judgment: Judgment normal.     Labs reviewed: Basic Metabolic Panel: Recent Labs    11/22/17 0811  NA 140  K 4.1  CL 104  CO2 29  GLUCOSE 98  BUN 18  CREATININE 0.86  CALCIUM 9.5  TSH 3.34   Liver Function Tests: Recent Labs    11/22/17 0811  AST 29  ALT 23  BILITOT 1.1  PROT 7.5   No results for input(s): LIPASE, AMYLASE in the last 8760 hours. No results for input(s): AMMONIA in the last 8760 hours. CBC: Recent Labs    11/22/17 0811  WBC 5.1  NEUTROABS 2,866  HGB 12.0  HCT 35.0  MCV 92.8  PLT 226   Lipid Panel: Recent Labs    11/22/17 0811  CHOL 138  HDL 50*  LDLCALC 70  TRIG 101  CHOLHDL 2.8   Lab Results  Component Value Date   HGBA1C 5.6 11/22/2017    Procedures since last visit: No results found.  Assessment/Plan 1. Irregular heartbeat - with exam today -normal ekg today and no related symptoms with falls - EKG 12-Lead  Unchanged from  prior  2. Unsteady gait -counseled to slow down so her body can catch up with her mind as she's moving  3. Age-related osteoporosis without current pathological fracture - cont weightbearing exercise - Cholecalciferol (VITAMIN D) 50 MCG (2000 UT) CAPS; Take 1 capsule (2,000 Units total) by mouth daily.  Dispense: 30 capsule; Refill: 5  4. 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 And stress  Dispense: 30 tablet; Refill: 0  5.  Urge incontinence -discussed scheduled trips to bathroom as first intervention -cont pad use  6. Need for shingles vaccine - Zoster Vaccine Adjuvanted [facility]) injection; Inject 0.5 mLs into the muscle once for 1 dose.  Dispense: 0.5 mL; Refill: 1 sent to [facility]  7.  Hyperlipidemia -f/u flp  8.  Hyperglycemia -f/u hba1c -is eating consistently so should not have lows to cause falls  9.  Hypothyroidism -f/u tsh  Labs/tests ordered:   Orders Placed This Encounter  Procedures  . CBC with Differential/Platelet    Standing Status:   Future    Standing Expiration Date:   05/23/2019  . COMPLETE METABOLIC PANEL WITH GFR    Standing Status:   Future    Standing Expiration Date:   05/23/2019  . Hemoglobin A1c    Standing Status:   Future    Standing Expiration Date:   05/23/2019  . Lipid panel    Standing  Status:   Future    Standing Expiration Date:   05/23/2019  . TSH    Standing Status:   Future    Standing Expiration Date:   05/23/2019  . EKG 12-Lead    Next appt:  6 mos CPE with fasting labs before   Jayleigh Notarianni L. Charleigh Correnti, D.O. [facility] [address], [place] [address] Cell Phone (Mon-Fri 8am-5pm):  [phone] On Call:  [phone] & follow prompts after 5pm & weekends Office Phone:  [phone] Office Fax:  [phone]

## 2018-08-01 ENCOUNTER — Other Ambulatory Visit: Payer: Self-pay | Admitting: Internal Medicine

## 2018-08-01 DIAGNOSIS — F439 Reaction to severe stress, unspecified: Secondary | ICD-10-CM

## 2018-08-01 NOTE — Telephone Encounter (Signed)
Database checked and verified Last filled 05/22/18

## 2018-08-14 ENCOUNTER — Encounter: Payer: Self-pay | Admitting: Nurse Practitioner

## 2018-08-14 ENCOUNTER — Ambulatory Visit (INDEPENDENT_AMBULATORY_CARE_PROVIDER_SITE_OTHER): Payer: Medicare Other | Admitting: Nurse Practitioner

## 2018-08-14 ENCOUNTER — Other Ambulatory Visit: Payer: Self-pay

## 2018-08-14 VITALS — BP 124/68 | HR 69 | Temp 97.7°F | Ht 67.0 in | Wt 118.8 lb

## 2018-08-14 DIAGNOSIS — N3941 Urge incontinence: Secondary | ICD-10-CM

## 2018-08-14 DIAGNOSIS — M7062 Trochanteric bursitis, left hip: Secondary | ICD-10-CM | POA: Diagnosis not present

## 2018-08-14 NOTE — Progress Notes (Signed)
Careteam: Patient Care Team: [name], DO as PCP - General (Geriatric Medicine) Rutherford Guys, MD as Consulting Physician (Ophthalmology) Druscilla Brownie, MD as Consulting Physician (Dermatology)  Advanced Directive information Does Patient Have a Medical Advance Directive?: Yes, Type of Advance Directive: [place], Does patient want to make changes to medical advance directive?: No - Patient declined  No Known Allergies  Chief Complaint  Patient presents with  . Acute Visit    Pain in left hip started on friday 08/11/2018. Patient states she did no fall she woke up and was in pain      HPI: Patient is a 85 y.o. female seen in the office today due to hip pain. Denies fall- however pt with hx of frequent falls per Dr Magdalene Molly last OV 3 days ago noticed occasional pain to side of left hip. Has progressively gotten worse. Currently sitting she is in no pain. When she gets up and walks increase pain 9/10. Taking an OCT supplement curamin which she is taking three times daily and this is significantly helping the pain.  Never had pain before. Describes as stabbing but does not radiate.   Review of Systems:  Review of Systems  Constitutional: Negative for chills and fever.  Respiratory: Negative for shortness of breath.   Cardiovascular: Negative for chest pain.  Genitourinary: Positive for urgency. Negative for dysuria and frequency.       Incontinence for several months    Musculoskeletal: Positive for joint pain and myalgias. Negative for falls.       Denies recent falls but with hx significant for falls.     Past Medical History:  Diagnosis Date  . Arthritis   . High cholesterol   . IBS (irritable bowel syndrome)   . Menopause   . Thyroid disease   . Tubular adenoma 01/13/2007   Past Surgical History:  Procedure Laterality Date  . biopsy left leg     still has scab on it pt states  . CATARACT EXTRACTION, BILATERAL  2012  . COLONOSCOPY    .  TONSILLECTOMY     Social History:   reports that she has never smoked. She has never used smokeless tobacco. She reports that she does not drink alcohol or use drugs.  Family History  Problem Relation Age of Onset  . Alzheimer's disease Mother   . Heart attack Father   . Alzheimer's disease Father   . Heart attack Sister   . Kidney disease Brother   . Colon cancer Neg Hx   . Stomach cancer Neg Hx     Medications: Patient's Medications  New Prescriptions   No medications on file  Previous Medications   ASPIRIN 81 MG TABLET    Take 81 mg by mouth daily after breakfast.    ATORVASTATIN (LIPITOR) 10 MG TABLET    TAKE 1 TABLET DAILY AT 6 P.M.   CALCIUM CARB-CHOLECALCIFEROL (CALCIUM 1000 + D PO)    Take 2 capsules by mouth 2 (two) times daily.    CHOLECALCIFEROL (VITAMIN D) 50 MCG (2000 UT) CAPS    Take 1 capsule (2,000 Units total) by mouth daily.   LEVOTHYROXINE (SYNTHROID, LEVOTHROID) 25 MCG TABLET    TAKE 1 TABLET DAILY 30 MINUTES BEFORE BREAKFAST ON AN EMPTY STOMACH   LORAZEPAM (ATIVAN) 0.5 MG TABLET    TAKE 1 TABLET (0.5 MG TOTAL) BY MOUTH 2 (TWO) TIMES DAILY AS NEEDED FOR ANXIETY. AND STRESS   MULTIPLE VITAMINS-MINERALS (MULTIVITAMIN WITH MINERALS) TABLET  Take 1 tablet by mouth daily after breakfast.    OVER THE COUNTER MEDICATION    3 (three) times daily. curamin 3 times daily for pain   POLYETHYLENE GLYCOL (MIRALAX / GLYCOLAX) PACKET    Take 17 g by mouth daily as needed for mild constipation or moderate constipation.   Modified Medications   No medications on file  Discontinued Medications   No medications on file     Physical Exam:  Vitals:   08/14/18 1031  BP: 124/68  Pulse: 69  Temp: 97.7 F (36.5 C)  TempSrc: Oral  SpO2: 97%  Weight: 118 lb 12.8 oz (53.9 kg)  Height: 5\' 7"  (1.702 m)   Body mass index is 18.61 kg/m.  Physical Exam Musculoskeletal:        General: Tenderness present. No swelling or deformity.     Left hip: She exhibits normal range of  motion, normal strength and no tenderness.     Lumbar back: She exhibits normal range of motion and no tenderness.     Right lower leg: No edema.     Left lower leg: No edema.       Legs:     Comments: Tenderness over left bursa   Skin:    General: Skin is warm and dry.     Findings: No bruising, erythema, lesion or rash.  Neurological:     Mental Status: She is alert and oriented to person, place, and time.     Gait: Gait abnormal (slow gait).     Labs reviewed: Basic Metabolic Panel: Recent Labs    11/22/17 0811  NA 140  K 4.1  CL 104  CO2 29  GLUCOSE 98  BUN 18  CREATININE 0.86  CALCIUM 9.5  TSH 3.34   Liver Function Tests: Recent Labs    11/22/17 0811  AST 29  ALT 23  BILITOT 1.1  PROT 7.5   No results for input(s): LIPASE, AMYLASE in the last 8760 hours. No results for input(s): AMMONIA in the last 8760 hours. CBC: Recent Labs    11/22/17 0811  WBC 5.1  NEUTROABS 2,866  HGB 12.0  HCT 35.0  MCV 92.8  PLT 226   Lipid Panel: Recent Labs    11/22/17 0811  CHOL 138  HDL 50*  LDLCALC 70  TRIG 101  CHOLHDL 2.8   TSH: Recent Labs    11/22/17 0811  TSH 3.34   A1C: Lab Results  Component Value Date   HGBA1C 5.6 11/22/2017     Assessment/Plan 1. Urge incontinence of urine -denies dysuria. Encouraged routine/scheduled toileting.   2. Trochanteric bursitis of left hip To use aleve 220 by mouth twice daily with food routinely for 5 days then to stop  May use tylenol 325 mg 2 tablets every 6 hours as needed pain -to use ice/heat 3 times daily to bursa and use muscle rub after -discussed PT but she was not interested at this time.  -to avoid sleeping on effected side.   Next appt: 11/20/2018, and PRN Carlos American. [place], [facility] [phone]

## 2018-08-14 NOTE — Patient Instructions (Addendum)
To use aleve 220 mg by mouth twice daily with food for 5 days  To use ice/heat three times daily to effective area with muscle rub after    Bursitis  Bursitis is inflammation and irritation of a bursa, which is one of the small, fluid-filled sacs that cushion and protect the moving parts of your body. These sacs are located between bones and muscles, bones and muscle attachments, or bones and skin areas that are next to bones. A bursa protects those structures from the wear and tear that results from frequent movement. An inflamed bursa causes pain and swelling. Fluid may build up inside the sac. Bursitis is most common near joints, especially the knees, elbows, hips, and shoulders. What are the causes? This condition may be caused by:  Injury from: ? A direct hit (blow), like falling on your knee or elbow. ? Overuse of a joint (repetitive stress).  Infection. This can happen if bacteria get into a bursa through a cut or scrape near a joint.  Diseases that cause joint inflammation, such as gout and rheumatoid arthritis. What increases the risk? You are more likely to develop this condition if you:  Have a job or hobby that involves a lot of repetitive stress on your joints.  Have a condition that weakens your body's defense system (immune system), such as diabetes, cancer, or HIV.  Do any of these often: ? Lift and reach overhead. ? Kneel or lean on hard surfaces. ? Run or walk. What are the signs or symptoms? The most common symptoms of this condition include:  Pain that gets worse when you move the affected body part or use it to support (bear) your body weight.  Inflammation.  Stiffness. Other symptoms include:  Redness.  Swelling.  Tenderness.  Warmth.  Pain that continues after rest.  Fever or chills. These may occur in bursitis that is caused by infection. How is this diagnosed? This condition may be diagnosed based on:  Your medical history and a physical  exam.  Imaging tests, such as an MRI.  A procedure to drain fluid from the bursa with a needle (aspiration). The fluid may be checked for signs of infection or gout.  Blood tests to rule out other causes of inflammation. How is this treated? This condition can usually be treated at home with rest, ice, applying pressure (compression), and raising the body part that is affected (elevation). This is called RICE therapy. For mild bursitis, RICE therapy may be all you need. Other treatments may include:  NSAIDs to treat pain and inflammation.  Corticosteroid medicines to fight inflammation. These medicines may be injected into and around the area of bursitis.  Aspiration of fluid from the bursa to relieve pain and improve movement.  Antibiotic medicine to treat an infected bursa.  A splint, brace, or walking aid, such as a cane.  Physical therapy if you continue to have pain or limited movement.  Surgery to remove a damaged or infected bursa. This may be needed if other treatments have not worked. Follow these instructions at home: Medicines  Take over-the-counter and prescription medicines only as told by your health care provider.  If you were prescribed an antibiotic medicine, take it as told by your health care provider. Do not stop taking the antibiotic even if you start to feel better. General instructions   Rest the affected area as told by your health care provider. ? If possible, raise (elevate) the affected area above the level of your heart  while you are sitting or lying down. ? Avoid activities that make pain worse.  Use splints, braces, pads, or walking aids as told by your health care provider.  If directed, put ice on the affected area: ? If you have a removable splint or brace, remove it as told by your health care provider. ? Put ice in a plastic bag. ? Place a towel between your skin and the bag or between your splint or brace and the bag. ? Leave the ice on  for 20 minutes, 2-3 times a day.  Keep all follow-up visits as told by your health care provider. This is important. Preventing future episodes Take actions to help prevent future episodes of bursitis.  Wear knee pads if you kneel often.  Wear sturdy running or walking shoes that fit you well.  Take breaks regularly from repetitive activity.  Warm up by stretching before doing any activity that takes a lot of effort.  Maintain a healthy weight or lose weight as recommended by your health care provider. If you need help doing this, ask your health care provider.  Exercise regularly. Start any new physical activity gradually. Contact a health care provider if you:  Have a fever.  Have chills.  Have bursitis that is not getting better with treatment or home care. Summary  Bursitis is inflammation and irritation of a bursa, which is one of the small, fluid-filled sacs that cushion and protect the moving parts of your body.  An inflamed bursa causes pain and swelling.  Bursitis is commonly diagnosed with a physical exam, but other tests are sometimes needed.  This condition can usually be treated at home with rest, ice, applying pressure (compression), and raising the body part that is affected (elevation). This is called RICE therapy. This information is not intended to replace advice given to you by your health care provider. Make sure you discuss any questions you have with your health care provider. Document Released: 05/21/2000 Document Revised: 04/08/2017 Document Reviewed: 04/08/2017 Elsevier Interactive Patient Education  2019 [employer].

## 2018-09-16 ENCOUNTER — Other Ambulatory Visit: Payer: Self-pay | Admitting: Internal Medicine

## 2018-09-16 DIAGNOSIS — E034 Atrophy of thyroid (acquired): Secondary | ICD-10-CM

## 2018-09-27 ENCOUNTER — Ambulatory Visit (INDEPENDENT_AMBULATORY_CARE_PROVIDER_SITE_OTHER): Payer: Medicare Other | Admitting: Family

## 2018-09-27 ENCOUNTER — Ambulatory Visit
Admission: RE | Admit: 2018-09-27 | Discharge: 2018-09-27 | Disposition: A | Discharge status: Home / Self Care | Payer: Medicare Other | Source: Ambulatory Visit | Attending: Family | Admitting: Family

## 2018-09-27 ENCOUNTER — Encounter: Payer: Self-pay | Admitting: Family

## 2018-09-27 ENCOUNTER — Other Ambulatory Visit: Payer: Self-pay

## 2018-09-27 DIAGNOSIS — M545 Low back pain, unspecified: Secondary | ICD-10-CM

## 2018-09-27 DIAGNOSIS — M25552 Pain in left hip: Secondary | ICD-10-CM

## 2018-09-27 MED ORDER — MELOXICAM 7.5 MG PO TABS: 7.5000 mg | ORAL_TABLET | Freq: Every day | ORAL | 0 refills | Status: DC

## 2018-09-27 NOTE — Progress Notes (Addendum)
This service is provided via telemedicine  No vital signs collected/recorded due to the encounter was a telemedicine visit.   Location of patient (ex: home, work):  Home   Patient consents to a telephone visit:  yes  Location of the provider (ex: office, home):  Office  Name of any referring provider:  Dr. Hollace Kinnier  Names of all persons participating in the telemedicine service and their role in the encounter:  Ruthell Rummage CMA, Yechiel Erny NP, and Spero Curb   Time spent on call:  Ruthell Rummage spent  7  Minutes with patient on phone   [facility] clinic  Provider:  Marlowe Sax, NP  Goals of Care:  Advanced Directives 08/14/2018  Does Patient Have a Medical Advance Directive? Yes  Type of Advance Directive [place]  Does patient want to make changes to medical advance directive? No - Patient declined  Copy of [place] in Chart? Yes - validated most recent copy scanned in chart (See row information)  Would patient like information on creating a medical advance directive? -     Chief Complaint  Patient presents with   Acute Visit    Bursitis     HPI: Patient is a 85 y.o. female seen today for an acute visit for evaluation of left hip pain.she states left hip pain has worsen.she describes pain as intermittent sharp pain.she states pain occurs sudden at times almost throwing her down when walking.pain starts from left hip and runs down to her ankle.she has had some numbness and tingling on the foot.Also has some back pain but not as bad as the left hip.she takes OTC curamin three times daily but medication not helping with the pain anymore.she has used Tylenol ,Naproxen,ice and muscle rub as directed in the past visit but states stopped because all this was not helping with the pain.shedenies any fever,chills or recent fall episode.she states last fall episode was several years ago though on chart review she saw MD 05/22/2018 due to  fall.[facility] 03/2018.  Past Medical History:  Diagnosis Date   Arthritis    High cholesterol    IBS (irritable bowel syndrome)    Menopause    Thyroid disease    Tubular adenoma 01/13/2007    Past Surgical History:  Procedure Laterality Date   biopsy left leg     still has scab on it pt states   CATARACT EXTRACTION, BILATERAL  2012   COLONOSCOPY     TONSILLECTOMY      No Known Allergies  Outpatient Encounter Medications as of 09/27/2018  Medication Sig   aspirin 81 MG tablet Take 81 mg by mouth daily after breakfast.    atorvastatin (LIPITOR) 10 MG tablet TAKE 1 TABLET DAILY AT 6 P.M.   Calcium Carb-Cholecalciferol (CALCIUM 1000 + D PO) Take 2 capsules by mouth 2 (two) times daily.    Cholecalciferol (VITAMIN D) 50 MCG (2000 UT) CAPS Take 1 capsule (2,000 Units total) by mouth daily.   levothyroxine (SYNTHROID, LEVOTHROID) 25 MCG tablet TAKE 1 TABLET DAILY 30 MINUTES BEFORE BREAKFAST ON AN EMPTY STOMACH   LORazepam (ATIVAN) 0.5 MG tablet TAKE 1 TABLET (0.5 MG TOTAL) BY MOUTH 2 (TWO) TIMES DAILY AS NEEDED FOR ANXIETY. AND STRESS   Multiple Vitamins-Minerals (MULTIVITAMIN WITH MINERALS) tablet Take 1 tablet by mouth daily after breakfast.    OVER THE COUNTER MEDICATION 3 (three) times daily. curamin 3 times daily for pain   polyethylene glycol (MIRALAX / GLYCOLAX)  packet Take 17 g by mouth daily as needed for mild constipation or moderate constipation.    No facility-administered encounter medications on file as of 09/27/2018.     Review of Systems:  Review of Systems  Constitutional: Negative for activity change, appetite change, chills, fatigue and fever.  Respiratory: Negative for cough, chest tightness, shortness of breath and wheezing.   Cardiovascular: Negative for chest pain, palpitations and leg swelling.  Gastrointestinal: Negative for abdominal distention, abdominal pain, constipation, nausea and vomiting.  Musculoskeletal: Positive for  arthralgias, back pain and gait problem. Negative for joint swelling.       No recent fall   Skin: Negative for color change, pallor and rash.  Neurological: Negative for dizziness, weakness, light-headedness and headaches.    Health Maintenance  Topic Date Due   TETANUS/TDAP  12/27/2018   INFLUENZA VACCINE  01/06/2019   DEXA SCAN  Completed   PNA vac Low Risk Adult  Completed   MAMMOGRAM  Discontinued    Physical Exam: There were no vitals filed for this visit. There is no height or weight on file to calculate BMI. Physical Exam   Unable to complete on Tele visit.   Labs reviewed: Basic Metabolic Panel: Recent Labs    11/22/17 0811  NA 140  K 4.1  CL 104  CO2 29  GLUCOSE 98  BUN 18  CREATININE 0.86  CALCIUM 9.5  TSH 3.34   Liver Function Tests: Recent Labs    11/22/17 0811  AST 29  ALT 23  BILITOT 1.1  PROT 7.5   CBC: Recent Labs    11/22/17 0811  WBC 5.1  NEUTROABS 2,866  HGB 12.0  HCT 35.0  MCV 92.8  PLT 226   Lipid Panel: Recent Labs    11/22/17 0811  CHOL 138  HDL 50*  LDLCALC 70  TRIG 101  CHOLHDL 2.8   Lab Results  Component Value Date   HGBA1C 5.6 11/22/2017    Procedures since last visit: No results found.  Assessment/Plan  Left hip pain/back pain  Worsening sharp intermittent pain current pain regimen ineffective.History of falls but none recently.Previously treated for bursitis.Numbness and tingling on foot will start on Mobic 7.5 mg tablet one by mouth daily. - DG Hip Unilat W OR W/O Pelvis 2-3 Views Left; Future  Labs/tests ordered: Left hip X-ray 2-3 views   Next appt:  11/20/2018  Time spent with patient 15 minutes >50% time spent counseling; reviewing medical record; tests and developing future plan of care.  Addendum: 09/27/2018  Left hip/pelvis  X-ray results showed bilateral osteoarthritis of both hips.No acute fracture.

## 2018-10-01 ENCOUNTER — Encounter: Payer: Self-pay | Admitting: Internal Medicine

## 2018-10-09 ENCOUNTER — Other Ambulatory Visit: Payer: Self-pay

## 2018-10-09 ENCOUNTER — Encounter: Payer: Medicare Other | Admitting: Internal Medicine

## 2018-10-09 ENCOUNTER — Encounter: Payer: Self-pay | Admitting: Internal Medicine

## 2018-10-09 NOTE — Progress Notes (Signed)
This encounter was created in error - please disregard.

## 2018-10-19 ENCOUNTER — Other Ambulatory Visit: Payer: Self-pay | Admitting: Internal Medicine

## 2018-10-20 ENCOUNTER — Other Ambulatory Visit: Payer: Self-pay | Admitting: Family

## 2018-11-20 ENCOUNTER — Other Ambulatory Visit: Payer: Self-pay

## 2018-11-20 ENCOUNTER — Other Ambulatory Visit: Payer: Medicare Other

## 2018-11-20 DIAGNOSIS — R2681 Unsteadiness on feet: Secondary | ICD-10-CM

## 2018-11-20 DIAGNOSIS — E785 Hyperlipidemia, unspecified: Secondary | ICD-10-CM

## 2018-11-20 DIAGNOSIS — N3941 Urge incontinence: Secondary | ICD-10-CM

## 2018-11-20 DIAGNOSIS — I499 Cardiac arrhythmia, unspecified: Secondary | ICD-10-CM

## 2018-11-20 DIAGNOSIS — R739 Hyperglycemia, unspecified: Secondary | ICD-10-CM

## 2018-11-20 DIAGNOSIS — E034 Atrophy of thyroid (acquired): Secondary | ICD-10-CM

## 2018-11-21 LAB — CBC WITH DIFFERENTIAL/PLATELET
Absolute Monocytes: 592 cells/uL (ref 200–950)
Basophils Absolute: 49 cells/uL (ref 0–200)
Basophils Relative: 0.8 %
Eosinophils Absolute: 390 cells/uL (ref 15–500)
Eosinophils Relative: 6.4 %
HCT: 33.3 % — ABNORMAL LOW (ref 35.0–45.0)
Hemoglobin: 11.3 g/dL — ABNORMAL LOW (ref 11.7–15.5)
Lymphs Abs: 1055 cells/uL (ref 850–3900)
MCH: 32.4 pg (ref 27.0–33.0)
MCHC: 33.9 g/dL (ref 32.0–36.0)
MCV: 95.4 fL (ref 80.0–100.0)
MPV: 10.7 fL (ref 7.5–12.5)
Monocytes Relative: 9.7 %
Neutro Abs: 4014 cells/uL (ref 1500–7800)
Neutrophils Relative %: 65.8 %
Platelets: 241 10*3/uL (ref 140–400)
RBC: 3.49 10*6/uL — ABNORMAL LOW (ref 3.80–5.10)
RDW: 12.3 % (ref 11.0–15.0)
Total Lymphocyte: 17.3 %
WBC: 6.1 10*3/uL (ref 3.8–10.8)

## 2018-11-21 LAB — HEMOGLOBIN A1C
Hgb A1c MFr Bld: 5.9 % of total Hgb — ABNORMAL HIGH (ref <5.7)
Mean Plasma Glucose: 123 (calc)
eAG (mmol/L): 6.8 (calc)

## 2018-11-21 LAB — COMPLETE METABOLIC PANEL WITH GFR
AG Ratio: 1.2 (calc) (ref 1.0–2.5)
ALT: 20 U/L (ref 6–29)
AST: 22 U/L (ref 10–35)
Albumin: 3.6 g/dL (ref 3.6–5.1)
Alkaline phosphatase (APISO): 43 U/L (ref 37–153)
BUN: 17 mg/dL (ref 7–25)
CO2: 28 mmol/L (ref 20–32)
Calcium: 9.3 mg/dL (ref 8.6–10.4)
Chloride: 105 mmol/L (ref 98–110)
Creat: 0.84 mg/dL (ref 0.60–0.88)
GFR, Est African American: 73 mL/min/{1.73_m2} (ref >=60)
GFR, Est Non African American: 63 mL/min/{1.73_m2} (ref >=60)
Globulin: 2.9 g/dL (calc) (ref 1.9–3.7)
Glucose, Bld: 102 mg/dL — ABNORMAL HIGH (ref 65–99)
Potassium: 4.2 mmol/L (ref 3.5–5.3)
Sodium: 141 mmol/L (ref 135–146)
Total Bilirubin: 1.1 mg/dL (ref 0.2–1.2)
Total Protein: 6.5 g/dL (ref 6.1–8.1)

## 2018-11-21 LAB — LIPID PANEL
Cholesterol: 125 mg/dL (ref <200)
HDL: 46 mg/dL — ABNORMAL LOW (ref >=50)
LDL Cholesterol (Calc): 62 mg/dL (calc)
Non-HDL Cholesterol (Calc): 79 mg/dL (calc) (ref <130)
Total CHOL/HDL Ratio: 2.7 (calc) (ref <5.0)
Triglycerides: 90 mg/dL (ref <150)

## 2018-11-21 LAB — TSH: TSH: 3.08 mIU/L (ref 0.40–4.50)

## 2018-11-23 ENCOUNTER — Encounter: Payer: Self-pay | Admitting: *Deleted

## 2018-11-27 ENCOUNTER — Ambulatory Visit: Payer: Medicare Other | Admitting: Internal Medicine

## 2018-12-14 ENCOUNTER — Encounter: Payer: Medicare Other | Admitting: Internal Medicine

## 2019-01-01 ENCOUNTER — Ambulatory Visit (INDEPENDENT_AMBULATORY_CARE_PROVIDER_SITE_OTHER): Payer: Medicare Other | Admitting: Internal Medicine

## 2019-01-01 ENCOUNTER — Other Ambulatory Visit: Payer: Self-pay

## 2019-01-01 ENCOUNTER — Encounter: Payer: Self-pay | Admitting: Internal Medicine

## 2019-01-01 VITALS — BP 128/60 | HR 77 | Temp 98.2°F | Ht 67.0 in | Wt 117.0 lb

## 2019-01-01 DIAGNOSIS — H6123 Impacted cerumen, bilateral: Secondary | ICD-10-CM

## 2019-01-01 DIAGNOSIS — Z681 Body mass index (BMI) 19 or less, adult: Secondary | ICD-10-CM

## 2019-01-01 DIAGNOSIS — E034 Atrophy of thyroid (acquired): Secondary | ICD-10-CM | POA: Diagnosis not present

## 2019-01-01 DIAGNOSIS — R2681 Unsteadiness on feet: Secondary | ICD-10-CM

## 2019-01-01 DIAGNOSIS — R413 Other amnesia: Secondary | ICD-10-CM

## 2019-01-01 DIAGNOSIS — R42 Dizziness and giddiness: Secondary | ICD-10-CM

## 2019-01-01 DIAGNOSIS — R636 Underweight: Secondary | ICD-10-CM

## 2019-01-01 DIAGNOSIS — N3941 Urge incontinence: Secondary | ICD-10-CM | POA: Diagnosis not present

## 2019-01-01 DIAGNOSIS — Z0000 Encounter for general adult medical examination without abnormal findings: Secondary | ICD-10-CM | POA: Diagnosis not present

## 2019-01-01 DIAGNOSIS — D649 Anemia, unspecified: Secondary | ICD-10-CM

## 2019-01-01 NOTE — Progress Notes (Signed)
Provider:  Rexene Edison. Mariea Clonts, D.O., C.M.D. Location:   [place]   Place of Service:   clinic  Previous PCP: [name], DO Patient Care Team: [name], DO as PCP - General (Geriatric Medicine) Rutherford Guys, MD as Consulting Physician (Ophthalmology) Druscilla Brownie, MD as Consulting Physician (Dermatology)  Extended Emergency Contact Information Primary Emergency Contact: Ke,Beauford Address: [place] Phone: [phone] Work Phone: [phone] Relation: Other Secondary Emergency Contact: Abel Presto Address: [address] [place], [place] [address] Johnnette Litter of [place] Phone: [phone] Work Phone: [phone] Mobile Phone: [phone] Relation: Daughter  Code Status: DNR Goals of Care: Advanced Directive information Advanced Directives 09/27/2018  Does Patient Have a Medical Advance Directive? Yes  Type of Advance Directive [place]  Does patient want to make changes to medical advance directive? No - Patient declined  Copy of [place] in Chart? Yes - validated most recent copy scanned in chart (See row information)  Would patient like information on creating a medical advance directive? -   Chief Complaint  Patient presents with  . Annual Exam    CPE    HPI: Patient is a 85 y.o. female seen today for an annual physical exam.  She does not seem to be the same person as her last in-person visit.  She is thinner and less sure of herself.  Every time she gets up to use the bathroom, she has urinary leakage.  Leaks even when she's not held it.  She sometimes does have the whole thing come out.  She has to keep a lot of pads on hand.  Uses 4-5 pads per day.  No pain.  Does not feel different down there to her.  She sat through Sunday school and church yesterday.  A soon as church was over, she really had to go and she had some leakage.  She made it  home then as quick as she could.  Going on for several months.    She is staggering like she's drunk all the time.    She has had some increased memory loss.    She has some help now.  She comes twice a week--she cleans for her--says she really does need help with that b/c the cat, the dog, her husband and herself are all old including the house.  Emerly is still helping Beauford with his full ADL care.    She's down to 117 lbs.  Says she was 114 lbs.  She is eating.  She loves to eat especially snacks b/w meals.  Labs were all good except mild elevation of hba1c at 5.9.  A lady at church has been keeping her supplied with pound cake.    NP (sounds like CNA or sitter not NP) that is visiting them is going to help them to get her the help she needs.  We cannot figure out who this is or where she comes from.    Beauford has resisted bathing by anyone but Enid Derry.  She was having terrible hip pain.  She had lumbar DDD with radiculopathy.  She was given prednisone and gabapentin 100mg  with orthopedics at [facility].  She also was given exercises to do.  She still uses the gabapentin at bedtime.  Her feet feel numb and cold when her stockings are removed.  She's doing much better and does not have to come back  there for her back/hips.  She's had a lot of anxiety and has bid prn ativan.    Past Medical History:  Diagnosis Date  . Arthritis   . High cholesterol   . IBS (irritable bowel syndrome)   . Menopause   . Thyroid disease   . Tubular adenoma 01/13/2007   Past Surgical History:  Procedure Laterality Date  . biopsy left leg     still has scab on it pt states  . CATARACT EXTRACTION, BILATERAL  2012  . COLONOSCOPY    . TONSILLECTOMY      reports that she has never smoked. She has never used smokeless tobacco. She reports that she does not drink alcohol or use drugs.  Functional Status Survey:  she is now getting help cleaning the house; a NP is supposed to meet with the two  daughters and Ailany to see what she needs   Family History  Problem Relation Age of Onset  . Alzheimer's disease Mother   . Heart attack Father   . Alzheimer's disease Father   . Heart attack Sister   . Kidney disease Brother   . Colon cancer Neg Hx   . Stomach cancer Neg Hx     Health Maintenance  Topic Date Due  . TETANUS/TDAP  12/27/2018  . INFLUENZA VACCINE  01/06/2019  . DEXA SCAN  Completed  . PNA vac Low Risk Adult  Completed  . MAMMOGRAM  Discontinued    No Known Allergies  Outpatient Encounter Medications as of 01/01/2019  Medication Sig  . aspirin 81 MG tablet Take 81 mg by mouth daily after breakfast.   . atorvastatin (LIPITOR) 10 MG tablet TAKE 1 TABLET DAILY AT 6 P.M.  . Calcium Carb-Cholecalciferol (CALCIUM 1000 + D PO) Take 2 capsules by mouth 2 (two) times daily.   . Cholecalciferol (VITAMIN D) 50 MCG (2000 UT) CAPS Take 1 capsule (2,000 Units total) by mouth daily.  Marland Kitchen gabapentin (NEURONTIN) 100 MG capsule Take 100 mg by mouth at bedtime as needed.  Marland Kitchen levothyroxine (SYNTHROID, LEVOTHROID) 25 MCG tablet TAKE 1 TABLET DAILY 30 MINUTES BEFORE BREAKFAST ON AN EMPTY STOMACH  . LORazepam (ATIVAN) 0.5 MG tablet TAKE 1 TABLET (0.5 MG TOTAL) BY MOUTH 2 (TWO) TIMES DAILY AS NEEDED FOR ANXIETY. AND STRESS  . Multiple Vitamins-Minerals (MULTIVITAMIN WITH MINERALS) tablet Take 1 tablet by mouth daily after breakfast.   . polyethylene glycol (MIRALAX / GLYCOLAX) packet Take 17 g by mouth daily as needed for mild constipation or moderate constipation.   . [DISCONTINUED] acetaminophen (MAPAP) 325 MG tablet Take 650 mg by mouth every 6 (six) hours as needed.  . [DISCONTINUED] meloxicam (MOBIC) 7.5 MG tablet Take 1 tablet (7.5 mg total) by mouth daily.  . [DISCONTINUED] naproxen sodium (ALEVE) 220 MG tablet Take 220 mg by mouth 2 (two) times a day.  . [DISCONTINUED] OVER THE COUNTER MEDICATION 3 (three) times daily. curamin 3 times daily for pain   No facility-administered  encounter medications on file as of 01/01/2019.     Review of Systems  Constitutional: Positive for weight loss. Negative for chills, fever and malaise/fatigue.  HENT: Positive for hearing loss. Negative for ear pain.   Eyes: Negative for blurred vision.  Respiratory: Negative for cough and shortness of breath.   Cardiovascular: Negative for chest pain and leg swelling.  Gastrointestinal: Positive for constipation. Negative for abdominal pain, blood in stool, diarrhea, heartburn and melena.  Genitourinary: Positive for frequency and urgency. Negative for dysuria, flank pain and  hematuria.  Skin: Negative for itching and rash.  Neurological: Negative for dizziness and loss of consciousness.       Vertigo with positional changes  Endo/Heme/Allergies: Does not bruise/bleed easily.  Psychiatric/Behavioral: Positive for memory loss. Negative for depression. The patient is nervous/anxious. The patient does not have insomnia.     Vitals:   01/01/19 0917  BP: 128/60  Pulse: 77  Temp: 98.2 F (36.8 C)  TempSrc: Oral  SpO2: 99%  Weight: 117 lb (53.1 kg)  Height: 5\' 7"  (1.702 m)   Body mass index is 18.32 kg/m. Physical Exam Constitutional:      General: She is not in acute distress.    Appearance: She is not toxic-appearing.     Comments: Has become frail-appearing  HENT:     Head: Normocephalic and atraumatic.     Right Ear: There is impacted cerumen.     Left Ear: There is impacted cerumen.     Nose:     Comments: Nose and mouth deferred with covid masking Eyes:     Extraocular Movements: Extraocular movements intact.     Conjunctiva/sclera: Conjunctivae normal.     Pupils: Pupils are equal, round, and reactive to light.  Neck:     Musculoskeletal: Neck supple.  Cardiovascular:     Rate and Rhythm: Normal rate and regular rhythm.     Pulses: Normal pulses.     Heart sounds: Normal heart sounds.  Pulmonary:     Effort: Pulmonary effort is normal.     Breath sounds: Normal  breath sounds.  Abdominal:     General: Abdomen is flat. Bowel sounds are normal. There is no distension.     Palpations: Abdomen is soft.     Tenderness: There is no abdominal tenderness.  Musculoskeletal: Normal range of motion.     Right lower leg: No edema.     Left lower leg: No edema.  Skin:    General: Skin is warm and dry.     Capillary Refill: Capillary refill takes less than 2 seconds.  Neurological:     General: No focal deficit present.     Mental Status: She is alert and oriented to person, place, and time.     Cranial Nerves: No cranial nerve deficit.     Sensory: No sensory deficit.     Motor: No weakness.     Coordination: Coordination normal.     Gait: Gait abnormal.     Deep Tendon Reflexes: Reflexes normal.     Comments: Unsteady gait--cerebellar look to it, but finger to nose and heel to shin intact, not really wide-based  Psychiatric:        Mood and Affect: Mood normal.     Comments: Seems unsure of herself    Labs reviewed: Basic Metabolic Panel: Recent Labs    11/20/18 0813  NA 141  K 4.2  CL 105  CO2 28  GLUCOSE 102*  BUN 17  CREATININE 0.84  CALCIUM 9.3   Liver Function Tests: Recent Labs    11/20/18 0813  AST 22  ALT 20  BILITOT 1.1  PROT 6.5   No results for input(s): LIPASE, AMYLASE in the last 8760 hours. No results for input(s): AMMONIA in the last 8760 hours. CBC: Recent Labs    11/20/18 0813  WBC 6.1  NEUTROABS 4,014  HGB 11.3*  HCT 33.3*  MCV 95.4  PLT 241   Cardiac Enzymes: No results for input(s): CKTOTAL, CKMB, CKMBINDEX, TROPONINI in the last 8760 hours.  BNP: Invalid input(s): POCBNP Lab Results  Component Value Date   HGBA1C 5.9 (H) 11/20/2018   Lab Results  Component Value Date   TSH 3.08 11/20/2018   Lab Results  Component Value Date   [id number] 03/27/2015   Lab Results  Component Value Date   FOLATE >20.0 03/27/2015   No results found for: IRON, TIBC, FERRITIN  Imaging and Procedures  Recently: Reviewed prior CT from a couple of years ago which did not show any hydrocephalus, but symptoms have developed more recently  Assessment/Plan 1. Annual physical exam - performed today as above  2. Urge incontinence of urine -much worse lately, will r/o hydrocephalus due to wet/whacky/wobbly triad -if not revealing, will need urology eval  3. Unsteady gait -ongoing, she's now holding onto her husband who's also unsteady--recently, she's had vertigo which sounds positional and she had right-sided nystagmus on my exam -will get the CT to r/o mass and hydrocephalus  4. Hypothyroidism due to acquired atrophy of thyroid -cont current levothyroxine  5. Vertigo -will need PT at home for BPPV if CT unrevealing  6. Anemia, unspecified type -mild, has been so chronically, monitor  7. Bilateral impacted cerumen -bilateral ears were flushed with warm water and peroxide  8. Memory loss - plus incontinence and unsteady gait that are newly worse - r/o NPH - CT Head Wo Contrast; Future  9. Underweight -has been losing weight -reports eating well and even snacking -I'm concerned that she and her husband can no longer manage adequately on their own -they are opposed to moving -I need to talk with their daughters again as I did once before  10. Body mass index (BMI) of 19 or less in adult -weight down quite a bit, encouraged regular snacks -does have a CNA it sounds like that helps with cleaning--could possibly help with groceries and cooking which could relieve her of some responsibility  Labs/tests ordered:   Orders Placed This Encounter  Procedures  . CT Head Wo Contrast    Standing Status:   Future    Standing Expiration Date:   04/02/2020    Order Specific Question:   ** REASON FOR EXAM (FREE TEXT)    Answer:   incontinence and vertigo    Order Specific Question:   Preferred imaging location?    Answer:   [id number]    Order Specific Question:   Radiology  Contrast Protocol - do NOT remove file path    Answer:   [email or web address]    F/u after CT head 01/15/2019  Ryker Pherigo L. Dick Hark, D.O. [facility] [address], [place] [address] Cell Phone (Mon-Fri 8am-5pm):  [phone] On Call:  [phone] & follow prompts after 5pm & weekends Office Phone:  [phone] Office Fax:  [phone]

## 2019-01-15 ENCOUNTER — Encounter: Payer: Self-pay | Admitting: Internal Medicine

## 2019-01-15 ENCOUNTER — Other Ambulatory Visit: Payer: Self-pay

## 2019-01-15 ENCOUNTER — Ambulatory Visit (INDEPENDENT_AMBULATORY_CARE_PROVIDER_SITE_OTHER): Payer: Medicare Other | Admitting: Internal Medicine

## 2019-01-15 VITALS — BP 118/62 | HR 73 | Temp 97.8°F | Ht 67.0 in | Wt 117.0 lb

## 2019-01-15 DIAGNOSIS — R413 Other amnesia: Secondary | ICD-10-CM

## 2019-01-15 DIAGNOSIS — R42 Dizziness and giddiness: Secondary | ICD-10-CM

## 2019-01-15 DIAGNOSIS — R2681 Unsteadiness on feet: Secondary | ICD-10-CM | POA: Diagnosis not present

## 2019-01-15 DIAGNOSIS — N3941 Urge incontinence: Secondary | ICD-10-CM | POA: Diagnosis not present

## 2019-01-15 NOTE — Progress Notes (Signed)
Location:  [facility]  Advanced Directives 09/27/2018  Does Patient Have a Medical Advance Directive? Yes  Type of Advance Directive [place]  Does patient want to make changes to medical advance directive? No - Patient declined  Copy of [place] in Chart? Yes - validated most recent copy scanned in chart (See row information)  Would patient like information on creating a medical advance directive? -     Chief Complaint  Patient presents with  . Medical Management of Chronic Issues    2week follow-up    HPI: Patient is a 85 y.o. female seen today for follow up of vertigo and unsteady gait.   Since last visit, the vertigo has not improved and she has dizziness a few times a day when changing positions. When these episodes occur, she sits down and waits for the vertigo to pass. She cannot tell if there are any triggers. Besides sitting, nothing reduces the vertigo. She does not report any falls or injuries.   Her gait remains unsteady at times.She can ambulate on her own and does not use any aids to walk.   Incontinent issues have not subsided and she is having a few episodes of urinary leakage a few times a day. Using about 4-5 pads a day. This has been ongoing for several months.  She states her memory is poor and she has trouble remembering appointments. She also states she feels overwhelmed at times trying to coordinate the care of there husband and taking care of the house. She is still having a nurse aid come to the house twice a week to help with the care of her husband and household chores.   She is schedulde to have her CT of the Head on 01/18/2019.      Past Medical History:  Diagnosis Date  . Arthritis   . High cholesterol   . IBS (irritable bowel syndrome)   . Menopause   . Thyroid disease   . Tubular adenoma 01/13/2007    Past Surgical History:  Procedure Laterality Date  . biopsy left leg     still has scab on it pt  states  . CATARACT EXTRACTION, BILATERAL  2012  . COLONOSCOPY    . TONSILLECTOMY      No Known Allergies  Outpatient Encounter Medications as of 01/15/2019  Medication Sig  . aspirin 81 MG tablet Take 81 mg by mouth daily after breakfast.   . atorvastatin (LIPITOR) 10 MG tablet TAKE 1 TABLET DAILY AT 6 P.M.  . Calcium Carb-Cholecalciferol (CALCIUM 1000 + D PO) Take 2 capsules by mouth 2 (two) times daily.   . Cholecalciferol (VITAMIN D) 50 MCG (2000 UT) CAPS Take 1 capsule (2,000 Units total) by mouth daily.  Marland Kitchen gabapentin (NEURONTIN) 100 MG capsule Take 100 mg by mouth at bedtime as needed.  Marland Kitchen levothyroxine (SYNTHROID, LEVOTHROID) 25 MCG tablet TAKE 1 TABLET DAILY 30 MINUTES BEFORE BREAKFAST ON AN EMPTY STOMACH  . LORazepam (ATIVAN) 0.5 MG tablet TAKE 1 TABLET (0.5 MG TOTAL) BY MOUTH 2 (TWO) TIMES DAILY AS NEEDED FOR ANXIETY. AND STRESS  . Multiple Vitamins-Minerals (MULTIVITAMIN WITH MINERALS) tablet Take 1 tablet by mouth daily after breakfast.   . polyethylene glycol (MIRALAX / GLYCOLAX) packet Take 17 g by mouth daily as needed for mild constipation or moderate constipation.    No facility-administered encounter medications on file as of 01/15/2019.     Review of Systems:  Review of Systems  Constitutional: Negative for  activity change, appetite change and fatigue.  Respiratory: Negative for cough, shortness of breath and wheezing.   Cardiovascular: Negative for chest pain, palpitations and leg swelling.  Genitourinary: Positive for frequency and urgency. Negative for dysuria and hematuria.  Musculoskeletal: Positive for gait problem. Negative for arthralgias and joint swelling.  Skin: Negative.   Neurological: Positive for dizziness and light-headedness. Negative for tremors, syncope, weakness and headaches.  Psychiatric/Behavioral: Negative for dysphoric mood and sleep disturbance. The patient is not nervous/anxious.     Health Maintenance  Topic Date Due  . TETANUS/TDAP   12/27/2018  . INFLUENZA VACCINE  01/06/2019  . DEXA SCAN  Completed  . PNA vac Low Risk Adult  Completed  . MAMMOGRAM  Discontinued    Physical Exam: Vitals:   01/15/19 0908  BP: 118/62  Pulse: 73  Temp: 97.8 F (36.6 C)  TempSrc: Oral  SpO2: 99%  Weight: 117 lb (53.1 kg)  Height: 5\' 7"  (1.702 m)   Body mass index is 18.32 kg/m. Physical Exam Vitals signs reviewed.  Constitutional:      General: She is not in acute distress.    Appearance: Normal appearance. She is normal weight. She is not ill-appearing.  HENT:     Head: Normocephalic.  Eyes:     General:        Right eye: No discharge.        Left eye: No discharge.     Extraocular Movements: Extraocular movements intact.     Pupils: Pupils are equal, round, and reactive to light.  Cardiovascular:     Rate and Rhythm: Normal rate and regular rhythm.     Pulses: Normal pulses.     Heart sounds: Normal heart sounds. No murmur.  Pulmonary:     Effort: Pulmonary effort is normal. No respiratory distress.     Breath sounds: Normal breath sounds. No wheezing.  Musculoskeletal:        General: No swelling or tenderness.     Right lower leg: No edema.     Left lower leg: No edema.  Skin:    General: Skin is warm and dry.  Neurological:     General: No focal deficit present.     Mental Status: She is alert and oriented to person, place, and time.     Sensory: No sensory deficit.     Coordination: Coordination normal.     Gait: Gait abnormal.  Psychiatric:        Mood and Affect: Mood normal.        Behavior: Behavior normal.        Thought Content: Thought content normal.     Labs reviewed: Basic Metabolic Panel: Recent Labs    11/20/18 0813  NA 141  K 4.2  CL 105  CO2 28  GLUCOSE 102*  BUN 17  CREATININE 0.84  CALCIUM 9.3  TSH 3.08   Liver Function Tests: Recent Labs    11/20/18 0813  AST 22  ALT 20  BILITOT 1.1  PROT 6.5   No results for input(s): LIPASE, AMYLASE in the last 8760 hours.  No results for input(s): AMMONIA in the last 8760 hours. CBC: Recent Labs    11/20/18 0813  WBC 6.1  NEUTROABS 4,014  HGB 11.3*  HCT 33.3*  MCV 95.4  PLT 241   Lipid Panel: Recent Labs    11/20/18 0813  CHOL 125  HDL 46*  LDLCALC 62  TRIG 90  CHOLHDL 2.7   Lab Results  Component Value  Date   HGBA1C 5.9 (H) 11/20/2018    Procedures since last visit: No results found.  Assessment/Plan 1. Vertigo - ongoing, condition does not seem to have progressed - CT head scheduled for 01/18/2019 - suggest PT at home if CT is unremarkable  2. Unsteady gait -ongoing, no nystagmus on exam today - gait is steady during exam, she is not holding onto her husband for assistance - CT head to r/o hydrocephalus   3. Memory loss - ongoing, incontinence and gait issues have not improved - CT Head Wo contrast scheduled   4. Urge incontinence of urine - ongoing, condition has not progressed since last visit - waiting on CT head to be done, if unremarkable suggest urology evaluation     Labs/tests ordered:  Ct head without contrast- scheduled Next appt:  04/02/2019

## 2019-01-18 ENCOUNTER — Ambulatory Visit
Admission: RE | Admit: 2019-01-18 | Discharge: 2019-01-18 | Disposition: A | Discharge status: Home / Self Care | Payer: Medicare Other | Source: Ambulatory Visit | Attending: Internal Medicine | Admitting: Internal Medicine

## 2019-01-18 DIAGNOSIS — R413 Other amnesia: Secondary | ICD-10-CM

## 2019-01-19 ENCOUNTER — Telehealth: Payer: Self-pay | Admitting: Internal Medicine

## 2019-01-19 DIAGNOSIS — R413 Other amnesia: Secondary | ICD-10-CM

## 2019-01-19 DIAGNOSIS — R42 Dizziness and giddiness: Secondary | ICD-10-CM

## 2019-01-19 DIAGNOSIS — R2681 Unsteadiness on feet: Secondary | ICD-10-CM

## 2019-01-19 DIAGNOSIS — N3941 Urge incontinence: Secondary | ICD-10-CM

## 2019-01-19 DIAGNOSIS — Z636 Dependent relative needing care at home: Secondary | ICD-10-CM

## 2019-01-19 NOTE — Telephone Encounter (Signed)
-----   Message from Logan Bores, [place] sent at 01/19/2019  2:55 PM EDT ----- Discussed results with patient, patient verbalized understanding of results. Patient is in agreement with referral. Message routed back to [employer] L, DO to place referral.

## 2019-01-23 DIAGNOSIS — N3941 Urge incontinence: Secondary | ICD-10-CM

## 2019-01-23 DIAGNOSIS — R413 Other amnesia: Secondary | ICD-10-CM

## 2019-01-23 DIAGNOSIS — K589 Irritable bowel syndrome without diarrhea: Secondary | ICD-10-CM

## 2019-01-23 DIAGNOSIS — H8111 Benign paroxysmal vertigo, right ear: Secondary | ICD-10-CM

## 2019-01-23 DIAGNOSIS — M1990 Unspecified osteoarthritis, unspecified site: Secondary | ICD-10-CM

## 2019-01-23 DIAGNOSIS — M5136 Other intervertebral disc degeneration, lumbar region: Secondary | ICD-10-CM

## 2019-02-22 ENCOUNTER — Encounter: Payer: Self-pay | Admitting: Family Medicine

## 2019-02-22 ENCOUNTER — Ambulatory Visit (INDEPENDENT_AMBULATORY_CARE_PROVIDER_SITE_OTHER): Payer: Medicare Other | Admitting: Family Medicine

## 2019-02-22 ENCOUNTER — Other Ambulatory Visit: Payer: Self-pay

## 2019-02-22 VITALS — BP 116/52 | HR 74 | Temp 97.2°F | Ht 66.0 in | Wt 116.1 lb

## 2019-02-22 DIAGNOSIS — E785 Hyperlipidemia, unspecified: Secondary | ICD-10-CM

## 2019-02-22 DIAGNOSIS — Z23 Encounter for immunization: Secondary | ICD-10-CM

## 2019-02-22 DIAGNOSIS — Z862 Personal history of diseases of the blood and blood-forming organs and certain disorders involving the immune mechanism: Secondary | ICD-10-CM | POA: Diagnosis not present

## 2019-02-22 DIAGNOSIS — Z638 Other specified problems related to primary support group: Secondary | ICD-10-CM

## 2019-02-22 DIAGNOSIS — Z7189 Other specified counseling: Secondary | ICD-10-CM

## 2019-02-22 DIAGNOSIS — E038 Other specified hypothyroidism: Secondary | ICD-10-CM

## 2019-02-22 DIAGNOSIS — S61059A Open bite of unspecified thumb without damage to nail, initial encounter: Secondary | ICD-10-CM | POA: Diagnosis not present

## 2019-02-22 DIAGNOSIS — W540XXA Bitten by dog, initial encounter: Secondary | ICD-10-CM

## 2019-02-22 LAB — CBC WITH DIFFERENTIAL/PLATELET
Basophils Absolute: 0.1 10*3/uL (ref 0.0–0.1)
Basophils Relative: 1.4 % (ref 0.0–3.0)
Eosinophils Absolute: 0.3 10*3/uL (ref 0.0–0.7)
Eosinophils Relative: 3.9 % (ref 0.0–5.0)
HCT: 34.5 % — ABNORMAL LOW (ref 36.0–46.0)
Hemoglobin: 11.5 g/dL — ABNORMAL LOW (ref 12.0–15.0)
Lymphocytes Relative: 24.8 % (ref 12.0–46.0)
Lymphs Abs: 1.6 10*3/uL (ref 0.7–4.0)
MCHC: 33.3 g/dL (ref 30.0–36.0)
MCV: 96.3 fl (ref 78.0–100.0)
Monocytes Absolute: 0.6 10*3/uL (ref 0.1–1.0)
Monocytes Relative: 8.9 % (ref 3.0–12.0)
Neutro Abs: 4 10*3/uL (ref 1.4–7.7)
Neutrophils Relative %: 61 % (ref 43.0–77.0)
Platelets: 217 10*3/uL (ref 150.0–400.0)
RBC: 3.59 Mil/uL — ABNORMAL LOW (ref 3.87–5.11)
RDW: 13.7 % (ref 11.5–15.5)
WBC: 6.5 10*3/uL (ref 4.0–10.5)

## 2019-02-22 MED ORDER — CEPHALEXIN 500 MG PO CAPS: 500.0000 mg | ORAL_CAPSULE | Freq: Four times a day (QID) | ORAL | 0 refills | Status: DC

## 2019-02-22 NOTE — Progress Notes (Signed)
New patient. Lower BP noted. She can get lightheaded occ.  No syncope.  H/o mild anemia.    L thumb with lac from her dog.  She was trying to give the dog medicine and it bit her.  She has to put the dog down today due to an illness, not because of the attack.  Bite was this AM.  She was able to rinse and clean the wound with iodine.   History of hypothyroidism on replacement.  See notes on follow-up labs.  No neck mass.  No dysphagia.  Caring for husband at baseline, he has dementia.  Discussed his situation and her home situation.  Elevated Cholesterol: Using medications without problems:yes Muscle aches: no Diet compliance:yes Exercise: limited given care concerns for her husband.  Prev max weight was about 125-130 lbs, but that was years ago.    Rare use of gabapentin and lorazepam.  She has a history of left greater than right foot numbness with previous evaluation done at [facility].  She is use lorazepam occasionally for anxiety.  She has noticed some occasional lapses in memory.  Unclear how much of this is due to strain from carrying  for her husband.  Daughter [name] designated if patient were incapacitated.   PMH and SH reviewed  Meds, vitals, and allergies reviewed.   ROS: Per HPI unless specifically indicated in ROS section   GEN: nad, alert and oriented HEENT: ncat NECK: supple w/o LA CV: rrr. PULM: ctab, no inc wob ABD: soft, +bs EXT: no edema SKIN: no acute rash  She knows the year the month and the day of the week.  Attention 3 out of 3, 3 out of 3 recall.  She can do math, can read a watch.  She understands idioms.  L thumb with superficial lac dorsally1.5 cm lac at the palmar IP.  Distally neurovascularly intact.  The IP joint itself is not tender.  Tissues easily reapproximated and not bleeding since it is so superficial.  Covered with Neosporin and nonstick bandage and a splint.

## 2019-02-22 NOTE — Patient Instructions (Signed)
Tetanus and flu shot today.  Go to the lab on the way out.  We'll contact you with your lab report. Keep your thumb clean and dry.  Use the splint until healed.  Start keflex in the meantime.  Update me as needed.  Take care.  Glad to see you.

## 2019-02-26 DIAGNOSIS — Z7189 Other specified counseling: Secondary | ICD-10-CM | POA: Insufficient documentation

## 2019-02-26 DIAGNOSIS — S61059A Open bite of unspecified thumb without damage to nail, initial encounter: Secondary | ICD-10-CM | POA: Insufficient documentation

## 2019-02-26 DIAGNOSIS — Z638 Other specified problems related to primary support group: Secondary | ICD-10-CM | POA: Insufficient documentation

## 2019-02-26 NOTE — Assessment & Plan Note (Addendum)
It was her dog, the dog does not have a history of aggression and she was trying to give it medicine.  It has been vaccinated and it is a low risk encounter otherwise.  Covered with a nonstick bandage with Neosporin.  Splinted because of the superficial laceration at the IP joint.  This should heal up.  She will update me as needed.  Start Keflex in the meantime, though there is no sign of infection at this point.  >30 minutes spent in face to face time with patient, >50% spent in counselling or coordination of care

## 2019-02-26 NOTE — Assessment & Plan Note (Signed)
Daughter Tara Harris designated if patient were incapacitated.

## 2019-02-26 NOTE — Assessment & Plan Note (Addendum)
No change in meds at this point.  Previous labs noted.

## 2019-02-26 NOTE — Assessment & Plan Note (Signed)
We are trying to get home health set up for her husband, family is supportive.  Unclear how much of her memory lapses are related to her role as a chronic caregiver.  Discussed.  We will follow along clinically.  She will update me as needed.

## 2019-02-26 NOTE — Assessment & Plan Note (Addendum)
No change in meds at this point.Previous labs noted.

## 2019-03-15 ENCOUNTER — Other Ambulatory Visit: Payer: Self-pay | Admitting: Internal Medicine

## 2019-03-15 DIAGNOSIS — E034 Atrophy of thyroid (acquired): Secondary | ICD-10-CM

## 2019-04-02 ENCOUNTER — Encounter: Payer: Self-pay | Admitting: Nurse Practitioner

## 2019-04-02 ENCOUNTER — Ambulatory Visit: Payer: Self-pay

## 2019-07-12 ENCOUNTER — Ambulatory Visit (INDEPENDENT_AMBULATORY_CARE_PROVIDER_SITE_OTHER): Payer: Medicare Other | Admitting: Family Medicine

## 2019-07-12 ENCOUNTER — Other Ambulatory Visit: Payer: Self-pay

## 2019-07-12 ENCOUNTER — Encounter: Payer: Self-pay | Admitting: Family Medicine

## 2019-07-12 VITALS — BP 118/60 | HR 96 | Temp 96.7°F | Ht 66.0 in | Wt 117.1 lb

## 2019-07-12 DIAGNOSIS — R202 Paresthesia of skin: Secondary | ICD-10-CM

## 2019-07-12 DIAGNOSIS — E034 Atrophy of thyroid (acquired): Secondary | ICD-10-CM

## 2019-07-12 DIAGNOSIS — R2681 Unsteadiness on feet: Secondary | ICD-10-CM | POA: Diagnosis not present

## 2019-07-12 DIAGNOSIS — R32 Unspecified urinary incontinence: Secondary | ICD-10-CM

## 2019-07-12 LAB — POC URINALSYSI DIPSTICK (AUTOMATED)
Bilirubin, UA: NEGATIVE
Blood, UA: NEGATIVE
Glucose, UA: NEGATIVE
Ketones, UA: NEGATIVE
Nitrite, UA: POSITIVE
Protein, UA: NEGATIVE
Spec Grav, UA: 1.025
Urobilinogen, UA: 0.2 U/dL
pH, UA: 6

## 2019-07-12 LAB — VITAMIN B12: Vitamin B-12: 435 pg/mL (ref 211–911)

## 2019-07-12 LAB — COMPREHENSIVE METABOLIC PANEL
ALT: 15 U/L (ref 0–35)
AST: 20 U/L (ref 0–37)
Albumin: 4.2 g/dL (ref 3.5–5.2)
Alkaline Phosphatase: 58 U/L (ref 39–117)
BUN: 19 mg/dL (ref 6–23)
CO2: 31 mEq/L (ref 19–32)
Calcium: 9.8 mg/dL (ref 8.4–10.5)
Chloride: 103 mEq/L (ref 96–112)
Creatinine, Ser: 0.79 mg/dL (ref 0.40–1.20)
GFR: 69.01 mL/min (ref >60.00)
Glucose, Bld: 101 mg/dL — ABNORMAL HIGH (ref 70–99)
Potassium: 4 mEq/L (ref 3.5–5.1)
Sodium: 139 mEq/L (ref 135–145)
Total Bilirubin: 1.2 mg/dL (ref 0.2–1.2)
Total Protein: 7.9 g/dL (ref 6.0–8.3)

## 2019-07-12 LAB — CBC WITH DIFFERENTIAL/PLATELET
Basophils Absolute: 0 10*3/uL (ref 0.0–0.1)
Basophils Relative: 0.6 % (ref 0.0–3.0)
Eosinophils Absolute: 0.2 10*3/uL (ref 0.0–0.7)
Eosinophils Relative: 4.1 % (ref 0.0–5.0)
HCT: 37.6 % (ref 36.0–46.0)
Hemoglobin: 12.5 g/dL (ref 12.0–15.0)
Lymphocytes Relative: 23.1 % (ref 12.0–46.0)
Lymphs Abs: 1.4 10*3/uL (ref 0.7–4.0)
MCHC: 33.3 g/dL (ref 30.0–36.0)
MCV: 96.1 fl (ref 78.0–100.0)
Monocytes Absolute: 0.6 10*3/uL (ref 0.1–1.0)
Monocytes Relative: 9.1 % (ref 3.0–12.0)
Neutro Abs: 3.8 10*3/uL (ref 1.4–7.7)
Neutrophils Relative %: 63.1 % (ref 43.0–77.0)
Platelets: 221 10*3/uL (ref 150.0–400.0)
RBC: 3.91 Mil/uL (ref 3.87–5.11)
RDW: 13.8 % (ref 11.5–15.5)
WBC: 6.1 10*3/uL (ref 4.0–10.5)

## 2019-07-12 LAB — TSH: TSH: 6.36 u[IU]/mL — ABNORMAL HIGH (ref 0.35–4.50)

## 2019-07-12 NOTE — Patient Instructions (Addendum)
Go to the lab on the way out.   Try Kegel exercises in the meantime.  If your labs are fine, then we may need to get PT set up at home.  Take care.  Glad to see you.

## 2019-07-12 NOTE — Progress Notes (Signed)
This visit occurred during the SARS-CoV-2 public health emergency.  Safety protocols were in place, including screening questions prior to the visit, additional usage of staff PPE, and extensive cleaning of exam room while observing appropriate contact time as indicated for disinfecting solutions.  Occ/rare use of gabapentin and lorazepam.  This does not seem to contribute to the symptoms described below.  Family encourage patient to come in.  She has urinary leakage occ, esp after prolonged sitting.  She has SUI.  It is annoying to patient but she had been putting up with that.  She has had some gait changes where she needs help with balance but the room doesn't spin.  H/o fall, not recently "when my feet got in my way."  She doesn't drink etoh.  She has some occ numbness in the B feet.  She was prev told she has some DDD/DJD in her lower back.  She has a cold sensation in the L foot but when she checks her foot is still feels warm.  She doesn't have claudication.    She is still caring for her husband at baseline.  He has dementia and we talked about that.    Meds, vitals, and allergies reviewed.   ROS: Per HPI unless specifically indicated in ROS section   GEN: nad, alert and oriented HEENT: ncat NECK: supple w/o LA CV: rrr.  PULM: ctab, no inc wob ABD: soft, +bs EXT: no edema SKIN: no acute rash Decreased monofilament testing on the left foot.  Symmetric dorsalis pedis pulses.  No weakness in the feet first dorsiflexion or plantarflexion. He did reflexes symmetric bilaterally. Strength and sensation within normal limits for the bilateral upper extremities. Cranial nerves II through XII intact.

## 2019-07-14 LAB — URINE CULTURE
MICRO NUMBER:: 10116787
SPECIMEN QUALITY:: ADEQUATE

## 2019-07-15 MED ORDER — LEVOTHYROXINE SODIUM 50 MCG PO TABS: ORAL_TABLET | ORAL | 1 refills | Status: DC

## 2019-07-15 MED ORDER — CEPHALEXIN 500 MG PO CAPS: 500.0000 mg | ORAL_CAPSULE | Freq: Two times a day (BID) | ORAL | 0 refills | Status: DC

## 2019-07-15 NOTE — Assessment & Plan Note (Addendum)
That could be related to neuropathy with decreased sensation in the left foot.  Discussed options.  If her labs are unremarkable then home health physical therapy may be reasonable.  See notes on labs. At least 30 minutes were devoted to patient care in this encounter (this can potentially include time spent reviewing the patient's file/history, interviewing and examining the patient, counseling/reviewing plan with patient, ordering referrals, ordering tests, reviewing relevant laboratory or x-ray data, and documenting the encounter).

## 2019-07-15 NOTE — Assessment & Plan Note (Addendum)
This sounds like stress urinary incontinence and not a new issue.  I do not think this is related to her other symptoms.  Discussed.  She can try Kegel exercises and then update me as needed.  Reasonable to check urine culture given her urinalysis.  Discussed with patient.  See notes on labs.

## 2019-07-17 ENCOUNTER — Other Ambulatory Visit: Payer: Self-pay | Admitting: Internal Medicine

## 2019-07-17 DIAGNOSIS — F439 Reaction to severe stress, unspecified: Secondary | ICD-10-CM

## 2019-07-27 ENCOUNTER — Telehealth: Payer: Self-pay

## 2019-07-27 NOTE — Telephone Encounter (Signed)
Tara Harris from [facility] called requesting verbal orders for PT - 1x a week for 1 week, and then 2x a week for 4 weeks. Verdis Frederickson can be reached back at [phone]

## 2019-07-27 NOTE — Telephone Encounter (Signed)
Please give the order.  Thanks.   

## 2019-07-27 NOTE — Telephone Encounter (Signed)
Gave verbal orders. 

## 2019-08-02 ENCOUNTER — Telehealth: Payer: Self-pay | Admitting: Family Medicine

## 2019-08-02 DIAGNOSIS — G629 Polyneuropathy, unspecified: Secondary | ICD-10-CM

## 2019-08-02 NOTE — Telephone Encounter (Signed)
Maria from [facility] called requesting a call back to discuss coding. She is wondering what the primary diagnosis is for coding the patient's unsteady gait. She can be reached at [phone].

## 2019-08-03 DIAGNOSIS — G629 Polyneuropathy, unspecified: Secondary | ICD-10-CM | POA: Insufficient documentation

## 2019-08-03 NOTE — Telephone Encounter (Signed)
Possibly related to neuropathy, G 62.9.  Thanks.

## 2019-08-03 NOTE — Telephone Encounter (Signed)
Tara Harris from [facility] advised.

## 2019-08-14 ENCOUNTER — Telehealth: Payer: Self-pay

## 2019-08-14 NOTE — Telephone Encounter (Signed)
Anda Kraft contacted the office back and she states that the patient has no fever and no sx of a UTI. She states that the patient is pushing fluids and she states the patient tells her "every time I stand up, I feel like I have to pee." - but Anda Kraft states she feels that this is more so from her drinking more fluids. Anda Kraft states that before she left the patient's house, she was feeling much better and stated patient told her that her legs did not feel as weak.  Will send FYI to Dr. Damita Dunnings

## 2019-08-14 NOTE — Telephone Encounter (Signed)
Let me know if she has any fevers or dysuria.   Would liberalize salt intake and inc fluids and see if that helps.   She isn't on BP meds at baseline. She has a relatively low BP at baseline.  Thanks.  Vitals with BMI 07/12/2019 02/22/2019 01/15/2019  Height 5\' 6"  5\' 6"  5\' 7"   Weight 117 lbs 1 oz 116 lbs 2 oz 117 lbs  BMI 18.9 99991111 0000000  Systolic 123456 [id number]  Diastolic 60 52 62  Pulse 96 74 73

## 2019-08-14 NOTE — Telephone Encounter (Signed)
Whitten with [facility] left v/m that when Anda Kraft got to pts home she had already been outside trying to walk around earlier today. Pt told Anda Kraft she was feeling weak and washed out; Anda Kraft gave pt potato chips and drink. Vitals were OK except BP 80/46. Pt had told Anda Kraft she had been drinking plenty today. Anda Kraft said looking at med box appears pt is taking her meds correctly. I tried to speak with Anda Kraft about FU BP readings and how pt was feeling when she left pt's home but did not get answer;I tried to call pt and was unable to speak with pt; I left v/m for Anda Kraft PT to call back to [facility]. FYI to Dr Damita Dunnings and Terri Skains CMA and [facility] LPN.

## 2019-08-15 NOTE — Telephone Encounter (Signed)
Noted. Thanks.

## 2019-09-05 DIAGNOSIS — J309 Allergic rhinitis, unspecified: Secondary | ICD-10-CM

## 2019-09-05 DIAGNOSIS — Z7982 Long term (current) use of aspirin: Secondary | ICD-10-CM

## 2019-09-05 DIAGNOSIS — G4762 Sleep related leg cramps: Secondary | ICD-10-CM

## 2019-09-05 DIAGNOSIS — M810 Age-related osteoporosis without current pathological fracture: Secondary | ICD-10-CM

## 2019-09-05 DIAGNOSIS — M1712 Unilateral primary osteoarthritis, left knee: Secondary | ICD-10-CM

## 2019-09-05 DIAGNOSIS — Z9181 History of falling: Secondary | ICD-10-CM

## 2019-09-05 DIAGNOSIS — E039 Hypothyroidism, unspecified: Secondary | ICD-10-CM | POA: Diagnosis not present

## 2019-09-05 DIAGNOSIS — E785 Hyperlipidemia, unspecified: Secondary | ICD-10-CM

## 2019-09-05 DIAGNOSIS — K589 Irritable bowel syndrome without diarrhea: Secondary | ICD-10-CM

## 2019-09-05 DIAGNOSIS — R32 Unspecified urinary incontinence: Secondary | ICD-10-CM

## 2019-09-13 ENCOUNTER — Telehealth: Payer: Self-pay

## 2019-09-13 ENCOUNTER — Other Ambulatory Visit (INDEPENDENT_AMBULATORY_CARE_PROVIDER_SITE_OTHER): Payer: Medicare Other

## 2019-09-13 DIAGNOSIS — E034 Atrophy of thyroid (acquired): Secondary | ICD-10-CM | POA: Diagnosis not present

## 2019-09-13 LAB — TSH: TSH: 1.98 u[IU]/mL (ref 0.35–4.50)

## 2019-09-13 NOTE — Telephone Encounter (Signed)
Pt was in today for TSH lab. She reported to lab that she was feeling "hypotensiv"e. And they came and got this nurse.  Talked with pt and she reported she had not eaten and has a hx of hypotension and it felt like her BP was down.  BP 106/68, HR 74, O2 99 Pt denied any other symptoms and was not light headed. Pt was given crackers and juice and said she was feeling fine. Advised pt if any symptoms occur again or she has any new symptoms she should contact the office. Pt verbalized understanding.

## 2019-09-14 NOTE — Telephone Encounter (Signed)
Noted. Thanks.

## 2019-10-02 ENCOUNTER — Telehealth: Payer: Self-pay | Admitting: Family Medicine

## 2019-10-02 NOTE — Telephone Encounter (Signed)
Patient's daughter, Tara Harris, called.  Patient was prescribed Lorazepam a year ago. It was very helpful.  Patient's under a lot of stress at home trying to take care of her husband with Alzheimer's. [name] wants to know if patient can get another prescription for Lorazepam. Patient uses [facility].

## 2019-10-04 MED ORDER — HYDROXYZINE HCL 10 MG PO TABS: 5.0000 mg | ORAL_TABLET | Freq: Two times a day (BID) | ORAL | 0 refills | Status: DC | PRN

## 2019-10-04 NOTE — Telephone Encounter (Signed)
I sent the prescription for hydroxyzine.  It is likely a safer alternative compared to a benzodiazepine but I still want her to be careful regarding her fall risk.  Please update me as needed.  Thanks.

## 2019-10-04 NOTE — Telephone Encounter (Signed)
Please check with patient's daughter.  That medication may help with anxiety but also increases her risk of falls and sedation.  See if anything else has been useful in the meantime.  Has she ever tried taking hydroxyzine?  That may be a safer alternative.  If she has tried alternatives, and if she is aware of the sedation/fall risk, and if that risk is acceptable to pt/family, then it may make sense to proceed.  If she is needing this medication frequently then we need to look into alternative preventive medications.  Please update me about above.  Thanks.

## 2019-10-04 NOTE — Telephone Encounter (Signed)
Patient has not ever tried any other medication and did indeed fall a few times last year that may be attributed to that medication.  Daughter would like for her to try the Hydroxyzine if you are agreeable with that.

## 2019-10-23 ENCOUNTER — Telehealth: Payer: Medicare Other | Admitting: Family Medicine

## 2019-10-29 ENCOUNTER — Other Ambulatory Visit: Payer: Self-pay | Admitting: Family Medicine

## 2019-10-29 NOTE — Telephone Encounter (Signed)
Refill request Hydroxyzine Last refill 10/01/19 #30 Last office visit 07/12/19

## 2019-10-30 NOTE — Telephone Encounter (Signed)
Sent. Thanks.   

## 2019-11-01 ENCOUNTER — Telehealth: Payer: Medicare Other | Admitting: Family Medicine

## 2019-11-01 ENCOUNTER — Telehealth: Payer: Self-pay | Admitting: Family Medicine

## 2019-11-01 NOTE — Telephone Encounter (Signed)
Called Tara Harris's daughter Tara Harris to reschedule appointment but she wasn't available. Her husband stated that he would get her to call back tomorrow to reschedule appointment for [employer].

## 2019-11-01 NOTE — Telephone Encounter (Signed)
Noted. Thanks.

## 2019-11-15 ENCOUNTER — Encounter: Payer: Self-pay | Admitting: Family Medicine

## 2019-11-15 ENCOUNTER — Telehealth (INDEPENDENT_AMBULATORY_CARE_PROVIDER_SITE_OTHER): Payer: Medicare Other | Admitting: Family Medicine

## 2019-11-15 ENCOUNTER — Other Ambulatory Visit: Payer: Self-pay | Admitting: *Deleted

## 2019-11-15 ENCOUNTER — Telehealth: Payer: Self-pay | Admitting: Family Medicine

## 2019-11-15 DIAGNOSIS — Z638 Other specified problems related to primary support group: Secondary | ICD-10-CM | POA: Diagnosis not present

## 2019-11-15 NOTE — Progress Notes (Signed)
Interactive audio and video telecommunications were attempted between this provider and patient, however failed, due to patient having technical difficulties OR patient did not have access to video capability.  We continued and completed visit with audio only.   Virtual Visit via Telephone Note  I connected with patient on 11/15/19  at 12:19 PM  by telephone and verified that I am speaking with the correct person using two identifiers.  Location of patient: home  Location of MD: [place] Tara of referring provider (if blank then none associated): Names per persons and role in encounter:  MD: Earlyne Iba, Patient: Tara Harris.    I discussed the limitations, risks, security and privacy concerns of performing an evaluation and management service by telephone and the availability of in person appointments. I also discussed with the patient that there may be a patient responsible charge related to this service. The patient expressed understanding and agreed to proceed.  CC: follow up.    History of Present Illness:   Pt and son in law on the call.  Not lightheaded.    Hydroxyzine used as needed for anxiety w/o sig relief.  Caring for her husband with memory loss.  We talked about caregiver strain.  She admits the strain has been difficult.  She is worried about him falling.  Family is supportive.    She can't sleep well at night, due to worry about her husband.  Getting him in a safer bed may be a sig change for patient.    She has some parttime help coming into her home.    Observations/Objective:  Speech normal. No apparent distress  Assessment and Plan: Anxiety exacerbated by caregiver strain.  I told her I wanted to check on options for anxiety.  We will get back in touch with her.  We are trying to get an adjustable bed for her husband.  That may help his situation and her anxiety level.  Still okay for outpatient follow-up.  Follow Up Instructions: see Harris.      I discussed the assessment and treatment plan with the patient. The patient was provided an opportunity to ask questions and all were answered. The patient agreed with the plan and demonstrated an understanding of the instructions.   The patient was advised to call back or seek an in-person evaluation if the symptoms worsen or if the condition fails to improve as anticipated.  I provided 15 minutes of non-face-to-face time during this encounter.  Elsie Stain, MD

## 2019-11-15 NOTE — Telephone Encounter (Signed)
Done

## 2019-11-15 NOTE — Telephone Encounter (Signed)
Patient called.  Patient wants to change her pharmacy [facility]. Patient doesn't have any medications due now.

## 2019-11-18 ENCOUNTER — Telehealth: Payer: Self-pay | Admitting: Family Medicine

## 2019-11-18 MED ORDER — BUSPIRONE HCL 5 MG PO TABS: 5.0000 mg | ORAL_TABLET | Freq: Two times a day (BID) | ORAL | 1 refills | Status: DC

## 2019-11-18 NOTE — Assessment & Plan Note (Signed)
Anxiety exacerbated by caregiver strain.  I told her I wanted to check on options for anxiety.  We will get back in touch with her.  We are trying to get an adjustable bed for her husband.  That may help his situation and her anxiety level.  Still okay for outpatient follow-up.

## 2019-11-18 NOTE — Telephone Encounter (Signed)
Please let patient know I am checking on options to get an appropriate bed for her husband at home.  I sent a prescription for BuSpar.  Have her start taking 5 mg twice a day.  She should go ahead and take it preemptively and not wait for her to have anxiety symptoms.  See if that helps and please update me in about 1 week, sooner if needed.  Thanks.

## 2019-11-19 NOTE — Telephone Encounter (Signed)
Left detailed message on voicemail.  

## 2019-12-07 ENCOUNTER — Ambulatory Visit (INDEPENDENT_AMBULATORY_CARE_PROVIDER_SITE_OTHER): Payer: Medicare Other

## 2019-12-07 ENCOUNTER — Encounter: Payer: Self-pay | Admitting: Emergency Medicine

## 2019-12-07 ENCOUNTER — Ambulatory Visit
Admission: EM | Admit: 2019-12-07 | Discharge: 2019-12-07 | Disposition: A | Discharge status: Home / Self Care | Payer: Medicare Other | Attending: Emergency Medicine | Admitting: Emergency Medicine

## 2019-12-07 ENCOUNTER — Other Ambulatory Visit: Payer: Self-pay

## 2019-12-07 ENCOUNTER — Telehealth: Payer: Self-pay

## 2019-12-07 DIAGNOSIS — R5383 Other fatigue: Secondary | ICD-10-CM

## 2019-12-07 DIAGNOSIS — R05 Cough: Secondary | ICD-10-CM

## 2019-12-07 NOTE — Telephone Encounter (Signed)
I called pt and pt does not feel like needs to go to UC or ED. pts caregiver said pt BP 91/58 pt has lightheadedness on and off; pt feels generally weak for 6 mths. Pt has runny nose and non prod cough for over a year.No CP,SOB or fever. Pt said she thinks her problem Is that she is tired; pt looks after her husband and does not want to put her husband in nursing home. Some nights pts husband talks all night and pt does not rest. Pt said she will call for appt if she feels like she needs one but pt does not think she would go to UC or ED. Wanted Dr Damita Dunnings to be aware of this call.

## 2019-12-07 NOTE — Discharge Instructions (Signed)
Important to follow-up with primary care for further evaluation and management as needed. No evidence of pneumonia on your chest x-ray today, though it does appear that you have chronic, interstitial lung disease. Go to ER for worsening fatigue, weakness, dizziness, chest pain, palpitations, difficulty breathing.

## 2019-12-07 NOTE — Telephone Encounter (Signed)
[facility] - Client TELEPHONE ADVICE RECORD [facility] Patient Name: Tara Harris Gender: Female DOB: Mar 14, 1932 Age: 86 Y 14 M 13 D Return Phone Number: [phone] (Alternate) Address: City/State/Zip: [place] [address] Client [facility] - Client Client Site [place] - Day Contact Type Call Who Is Calling Patient / Member / Family / Caregiver Call Type Triage / Clinical Caller Name Kirtland BATTEN Relationship To Patient Care Giver Return Phone Number [phone] (Alternate) Chief Complaint BLOOD PRESSURE LOW - Systolic (top number) 90 or less with dizzy or weak symptoms Reason for Call Symptomatic / Request for Health Information Initial Comment Caller states BP of patient 91/58. She is very dizzy. Translation No Nurse Assessment Nurse: Gareth Eagle, RN, Raquel Sarna Date/Time Eilene Ghazi Time): 12/07/2019 12:21:21 PM Confirm and document reason for call. If symptomatic, describe symptoms. ---Caller states patient is very weak and dizzy, BP 91/58 . Has been having these episodes of symptomatic hypotension intermittently since October and has been seen by her doctor, however, when she is seen, her BP is within normal. Has the patient had close contact with a person known or suspected to have the novel coronavirus illness OR traveled / lives in area with major community spread (including international travel) in the last 14 days from the onset of symptoms? * If Asymptomatic, screen for exposure and travel within the last 14 days. ---No Does the patient have any new or worsening symptoms? ---Yes Will a triage be completed? ---Yes Related visit to physician within the last 2 weeks? ---No Does the PT have any chronic conditions? (i.e. diabetes, asthma, this includes High risk factors for pregnancy, etc.) ---Yes List chronic conditions. ---hypothyroidism, high cholesterol Is this a behavioral health or  substance abuse call? ---No Guidelines Guideline Title Affirmed Question Affirmed Notes Nurse Date/Time (Eastern Time) Blood Pressure - Low [5] Fall in systolic BP > 20 mm Hg from normal AND [2] dizzy, lightheaded, or weak Theodoro Grist 12/07/2019 12:25:22 PM PLEASE NOTE: All timestamps contained within this report are represented as [place] Standard Time. CONFIDENTIALTY NOTICE: This fax transmission is intended only for the addressee. It contains information that is legally privileged, confidential or otherwise protected from use or disclosure. If you are not the intended recipient, you are strictly prohibited from reviewing, disclosing, copying using or disseminating any of this information or taking any action in reliance on or regarding this information. If you have received this fax in error, please notify us immediately by telephone so that we can arrange for its return to [place]. Phone: [phone], Toll-Free: [phone], Fax: [phone] Page: 2 of 2 Call Id: [id number] [place]. Time Eilene Ghazi Time) Disposition Final User 12/07/2019 12:19:57 PM Send to Urgent Queue Scharlene Corn 12/07/2019 12:34:34 PM Go to ED Now (or PCP triage) Yes Gareth Eagle, RN, Judeth Horn Disagree/Comply Comply Caller Understands Yes PreDisposition Did not know what to do Care Advice Given Per Guideline GO TO ED NOW (OR PCP TRIAGE): * IF NO PCP (PRIMARY CARE PROVIDER) SECOND-LEVEL TRIAGE: You need to be seen within the next hour. Go to the [facility] at _____________ [place] as soon as you can. CARE ADVICE given per Low Blood Pressure (Adult) guideline. Comments User: Tilden Fossa, RN Date/Time (Eastern Time): 12/07/2019 12:36:41 PM Caller states she sometimes feels that the patient attempts to minimize the severity of her symptoms of weakness and dizziness and overall malaise when seen by providers, as she is also trying to care for her ailing husband, puts herself  last, and does not want to jeopardize  his care by the possibility of her absence. User: Tilden Fossa, RN Date/Time Eilene Ghazi Time): 12/07/2019 12:37:08 PM No appts available for pt to be seen in office today per Raquel Sarna at office via [place]. Referrals GO TO FACILITY UNDECIDED

## 2019-12-07 NOTE — Telephone Encounter (Signed)
Would advise ER or UC at least.

## 2019-12-07 NOTE — Telephone Encounter (Signed)
Pt notified as instructed and pt voiced understanding. pt said she would drive herself later if she thought she needed to go. Explained again Dr Josefine Class advise and that pt should not drive if lightheaded and pt said I could speak with [name] her daughter who is on her way now to take pt to nail appt;   I spoke with [name] (DPR signed) and [name] said pt has been looking forward to this nail appt and she will take her mom to nail appt first and then go to [facility] on [address].  [name] will explain plan to her mom when she gets to her mom's home. FYI to Dr Damita Dunnings.

## 2019-12-07 NOTE — Telephone Encounter (Signed)
Noted. Thanks.

## 2019-12-07 NOTE — ED Provider Notes (Signed)
[facility]    CSN: [id number] Arrival date & time: 12/07/19  1642      History   Chief Complaint Chief Complaint  Patient presents with  . Hypotension  . Weakness    HPI Tara Harris is a 86 y.o. female with history of thyroid disease, tubular adenoma, IBS, arthritis presenting with her daughter for evaluation of increased fatigue.  Patient states this is been ongoing for the last 2 weeks.  Has had a chronic cough for about a year without hemoptysis, chest pain, shortness of breath.  No lower leg swelling, fever, thrashes, myalgias.  No known sick contacts.  Has not undergone Covid testing since initial onset.  States care attendant had hypotensive blood pressure readings 90s/50s earlier today which prompted today's visit.  No nausea, vomiting, dizziness, syncopal event.  Past Medical History:  Diagnosis Date  . Arthritis   . High cholesterol   . IBS (irritable bowel syndrome)   . Menopause   . Thyroid disease   . Tubular adenoma 01/13/2007    Patient Active Problem List   Diagnosis Date Noted  . Neuropathy 08/03/2019  . Caregiver role strain 02/26/2019  . Advance care planning 02/26/2019  . Unsteady gait 05/22/2018  . Age-related osteoporosis without current pathological fracture 05/22/2018  . Hyperglycemia 07/03/2015  . Nocturnal leg cramps 07/03/2015  . Hypothyroidism 02/20/2015  . Urinary incontinence 05/31/2011  . UNSPECIFIED VITAMIN D DEFICIENCY 01/07/2010  . ARTHRITIS, LEFT KNEE 01/07/2010  . IRRITABLE BOWEL SYNDROME 08/07/2009  . Hyperlipidemia 12/26/2007  . ALLERGIC RHINITIS, CHRONIC 12/26/2007  . ARTHRITIS 12/26/2007    Past Surgical History:  Procedure Laterality Date  . biopsy left leg    . CATARACT EXTRACTION, BILATERAL  2012  . COLONOSCOPY    . TONSILLECTOMY      OB History   No obstetric history on file.      Home Medications    Prior to Admission medications   Medication Sig Start Date End Date Taking? Authorizing  Provider  aspirin 81 MG tablet Take 81 mg by mouth daily after breakfast.     [provider]  atorvastatin (LIPITOR) 10 MG tablet TAKE 1 TABLET DAILY AT 6 P.M. 10/19/18   Reed, Tiffany L, DO  busPIRone (BUSPAR) 5 MG tablet Take 1 tablet (5 mg total) by mouth 2 (two) times daily. 11/18/19   Tonia Ghent, MD  Calcium Carb-Cholecalciferol (CALCIUM 1000 + D PO) Take 2 capsules by mouth 2 (two) times daily.     [provider]  Cholecalciferol (VITAMIN D) 50 MCG (2000 UT) CAPS Take 1 capsule (2,000 Units total) by mouth daily. 05/22/18   Reed, Tiffany L, DO  gabapentin (NEURONTIN) 100 MG capsule Take 100 mg by mouth at bedtime as needed.    [provider]  hydrOXYzine (ATARAX/VISTARIL) 10 MG tablet TAKE 0.5 TABLETS (5 MG TOTAL) BY MOUTH 2 (TWO) TIMES DAILY AS NEEDED FOR ANXIETY (SEDATION CAUTION). 10/30/19   Tonia Ghent, MD  levothyroxine (SYNTHROID) 50 MCG tablet Take 50 mcg by mouth daily on empty stomach. 07/15/19   Tonia Ghent, MD  Multiple Vitamins-Minerals (MULTIVITAMIN WITH MINERALS) tablet Take 1 tablet by mouth daily after breakfast.     [provider]  polyethylene glycol (MIRALAX / GLYCOLAX) packet Take 17 g by mouth daily as needed for mild constipation or moderate constipation.     [provider]    Family History Family History  Problem Relation Age of Onset  . Alzheimer's disease Mother   .  Heart attack Father   . Alzheimer's disease Father   . Heart attack Sister   . Kidney disease Brother   . Colon cancer Neg Hx   . Stomach cancer Neg Hx   . Breast cancer Neg Hx     Social History Social History   Tobacco Use  . Smoking status: Never Smoker  . Smokeless tobacco: Never Used  Vaping Use  . Vaping Use: Never used  Substance Use Topics  . Alcohol use: No  . Drug use: No     Allergies   Patient has no known allergies.   Review of Systems As per HPI   Physical Exam Triage Vital Signs ED Triage Vitals   Enc Vitals Group     BP      Pulse      Resp      Temp      Temp src      SpO2      Weight      Height      Head Circumference      Peak Flow      Pain Score      Pain Loc      Pain Edu?      Excl. in [place]?    No data found.  Updated Vital Signs BP (!) 165/96 (BP Location: Left Arm)   Pulse 70   Temp 98.6 F (37 C) (Oral)   Resp 18   SpO2 96%   Visual Acuity Right Eye Distance:   Left Eye Distance:   Bilateral Distance:    Right Eye Near:   Left Eye Near:    Bilateral Near:     Physical Exam Constitutional:      General: She is not in acute distress.    Appearance: She is not ill-appearing or diaphoretic.  HENT:     Head: Normocephalic and atraumatic.     Mouth/Throat:     Mouth: Mucous membranes are moist.     Pharynx: Oropharynx is clear. No oropharyngeal exudate or posterior oropharyngeal erythema.  Eyes:     General: No scleral icterus.    Conjunctiva/sclera: Conjunctivae normal.     Pupils: Pupils are equal, round, and reactive to light.  Neck:     Comments: Trachea midline, negative JVD Cardiovascular:     Rate and Rhythm: Normal rate and regular rhythm.     Heart sounds: No murmur heard.  No gallop.   Pulmonary:     Effort: Pulmonary effort is normal. No respiratory distress.     Breath sounds: No wheezing or rhonchi.     Comments: Bibasilar rales Musculoskeletal:     Cervical back: Neck supple. No tenderness.     Right lower leg: No edema.     Left lower leg: No edema.  Lymphadenopathy:     Cervical: No cervical adenopathy.  Skin:    Capillary Refill: Capillary refill takes less than 2 seconds.     Coloration: Skin is not jaundiced or pale.     Findings: No rash.  Neurological:     General: No focal deficit present.     Mental Status: She is alert and oriented to person, place, and time.      UC Treatments / Results  Labs (all labs ordered are listed, but only abnormal results are displayed) Labs Reviewed - No data to display  EKG    Radiology DG Chest 2 View  Result Date: 12/07/2019 CLINICAL DATA:  Cough EXAM: CHEST - 2 VIEW COMPARISON:  01/25/2017, CT 01/21/2017 FINDINGS: Increased coarse reticular opacities throughout with more prominent interstitial opacities in the upper lobes. Mild apical thickening. No focal consolidation or pleural effusion. Normal heart size. No pneumothorax. IMPRESSION: Diffusely increased coarse reticular opacities consistent with chronic interstitial lung disease. Overall increased interstitial prominence particularly in the upper lobes either reflecting disease progression or possible acute interstitial inflammatory process superimposed on underlying chronic disease. There is no consolidative pneumonia Electronically Signed   By: Donavan Foil M.D.   On: 12/07/2019 18:24    Procedures Procedures (including critical care time)  Medications Ordered in UC Medications - No data to display  Initial Impression / Assessment and Plan / UC Course  I have reviewed the triage vital signs and the nursing notes.  Pertinent labs & imaging results that were available during my care of the patient were reviewed by me and considered in my medical decision making (see chart for details).     Patient afebrile, nontoxic, with SpO2 96%.  Chest x-ray done office, reviewed by me radiology: Significant for diffusely increased coarse reticular opacities consistent with chronic interstitial lung disease.  Overall increased interstitial prominence particularly in the upper lobes either reflecting a disease progression or possible acute interstitial inflammatory process superimposed on underlying chronic disease.  No consolidative pneumonia.  Reviewed findings with patient and daughter who verbalized understanding.  Return precautions discussed, patient verbalized understanding and is agreeable to plan. Final Clinical Impressions(s) / UC Diagnoses   Final diagnoses:  Fatigue, unspecified type     Discharge  Instructions     Important to follow-up with primary care for further evaluation and management as needed. No evidence of pneumonia on your chest x-ray today, though it does appear that you have chronic, interstitial lung disease. Go to ER for worsening fatigue, weakness, dizziness, chest pain, palpitations, difficulty breathing.    ED Prescriptions    None     PDMP not reviewed this encounter.   Neldon [place], [place] 12/07/19 1936

## 2019-12-07 NOTE — ED Triage Notes (Signed)
Pt presents to [facility] for assessment of months of hypotension, with worsening in the last 3 weeks.  Patient states she feels weak all over and cold when it happens.  Patient denies syncope, or dizziness.  States eating can sometimes help.  Worse in the mornings when getting out of bed, but can happen through out the day.

## 2019-12-11 ENCOUNTER — Encounter: Payer: Self-pay | Admitting: Family Medicine

## 2019-12-11 ENCOUNTER — Ambulatory Visit (INDEPENDENT_AMBULATORY_CARE_PROVIDER_SITE_OTHER): Payer: Medicare Other | Admitting: Family Medicine

## 2019-12-11 ENCOUNTER — Other Ambulatory Visit: Payer: Self-pay

## 2019-12-11 VITALS — BP 136/62 | HR 82 | Temp 97.0°F | Ht 66.0 in | Wt 113.6 lb

## 2019-12-11 DIAGNOSIS — Z638 Other specified problems related to primary support group: Secondary | ICD-10-CM | POA: Diagnosis not present

## 2019-12-11 DIAGNOSIS — N649 Disorder of breast, unspecified: Secondary | ICD-10-CM

## 2019-12-11 DIAGNOSIS — R9389 Abnormal findings on diagnostic imaging of other specified body structures: Secondary | ICD-10-CM | POA: Diagnosis not present

## 2019-12-11 LAB — CBC WITH DIFFERENTIAL/PLATELET
Basophils Absolute: 0.1 10*3/uL (ref 0.0–0.1)
Basophils Relative: 1.3 % (ref 0.0–3.0)
Eosinophils Absolute: 0.2 10*3/uL (ref 0.0–0.7)
Eosinophils Relative: 3.4 % (ref 0.0–5.0)
HCT: 35.3 % — ABNORMAL LOW (ref 36.0–46.0)
Hemoglobin: 12 g/dL (ref 12.0–15.0)
Lymphocytes Relative: 26.6 % (ref 12.0–46.0)
Lymphs Abs: 1.7 10*3/uL (ref 0.7–4.0)
MCHC: 34.1 g/dL (ref 30.0–36.0)
MCV: 95.9 fl (ref 78.0–100.0)
Monocytes Absolute: 0.7 10*3/uL (ref 0.1–1.0)
Monocytes Relative: 10.6 % (ref 3.0–12.0)
Neutro Abs: 3.6 10*3/uL (ref 1.4–7.7)
Neutrophils Relative %: 58.1 % (ref 43.0–77.0)
Platelets: 224 10*3/uL (ref 150.0–400.0)
RBC: 3.69 Mil/uL — ABNORMAL LOW (ref 3.87–5.11)
RDW: 13.5 % (ref 11.5–15.5)
WBC: 6.2 10*3/uL (ref 4.0–10.5)

## 2019-12-11 LAB — COMPREHENSIVE METABOLIC PANEL
ALT: 12 U/L (ref 0–35)
AST: 20 U/L (ref 0–37)
Albumin: 4.3 g/dL (ref 3.5–5.2)
Alkaline Phosphatase: 51 U/L (ref 39–117)
BUN: 17 mg/dL (ref 6–23)
CO2: 31 mEq/L (ref 19–32)
Calcium: 10.1 mg/dL (ref 8.4–10.5)
Chloride: 101 mEq/L (ref 96–112)
Creatinine, Ser: 0.82 mg/dL (ref 0.40–1.20)
GFR: 66.04 mL/min (ref >60.00)
Glucose, Bld: 81 mg/dL (ref 70–99)
Potassium: 3.9 mEq/L (ref 3.5–5.1)
Sodium: 138 mEq/L (ref 135–145)
Total Bilirubin: 1.3 mg/dL — ABNORMAL HIGH (ref 0.2–1.2)
Total Protein: 7.7 g/dL (ref 6.0–8.3)

## 2019-12-11 NOTE — Patient Instructions (Signed)
We'll call about getting a mammogram set up.  Go to the lab on the way out.   If you have mychart we'll likely use that to update you.    We may need to get you to see the lung doctors- we'll be in touch.  Take care.  Glad to see you.

## 2019-12-11 NOTE — Progress Notes (Signed)
This visit occurred during the SARS-CoV-2 public health emergency.  Safety protocols were in place, including screening questions prior to the visit, additional usage of staff PPE, and extensive cleaning of exam room while observing appropriate contact time as indicated for disinfecting solutions.  She thought her mood was good enough to where she didn't think she needed to take buspar or hydroxyzine.  D/w pt.  She is fatigued.  She is caring for her husband and that is disrupting her sleep.    We had been checking on options to get her husband and adjustable bed but he doesn't have it yet.  Daughter I will check on options.  BP wasn't low at UC but CXR with diffusely increased coarse reticular opacities consistent with chronic interstitial lung disease. Overall increased interstitial prominence particularly in the upper lobes either reflecting disease progression or possible acute interstitial inflammatory process superimposed on underlying chronic disease. There is no consolidative pneumonia  She still feels diffusely weak.  No falls.  No acute changes.  Still with a cough, better with cough drops.  No sputum.  No fevers.  No wheeze. No recent inhaler use.    Never smoker.    She has L breast changes at baseline (L breast has always been smaller than R) but now with L nipple sinking inward.  No pain.  No discharge.   She has some urinary urgency at baseline w/o burning with urination.    She had a tick bite on her neck recently but it was removed before it was engorged.  Meds, vitals, and allergies reviewed.   ROS: Per HPI unless specifically indicated in ROS section   nad ncat Neck supple, no LA rrr Coarse BS, no wheeze Chaperoned exam.  L nipple inverted.  Open comedone on the L breast.  No breast mass otherwise.  No left-sided lymphadenopathy in the axilla or along the clavicle. Right breast with normal exam without mass or nipple inversion.  No clavicular or axillary  lymphadenopathy. Abdomen soft. Extremities without edema. Speech and affect normal.  At least 30 minutes were devoted to patient care in this encounter (this can potentially include time spent reviewing the patient's file/history, interviewing and examining the patient, counseling/reviewing plan with patient, ordering referrals, ordering tests, reviewing relevant laboratory or x-ray data, and documenting the encounter).

## 2019-12-14 ENCOUNTER — Emergency Department (HOSPITAL_BASED_OUTPATIENT_CLINIC_OR_DEPARTMENT_OTHER)
Admission: EM | Admit: 2019-12-14 | Discharge: 2019-12-14 | Disposition: A | Discharge status: Home / Self Care | Payer: Medicare Other | Attending: Emergency Medicine | Admitting: Emergency Medicine

## 2019-12-14 ENCOUNTER — Encounter (HOSPITAL_BASED_OUTPATIENT_CLINIC_OR_DEPARTMENT_OTHER): Payer: Self-pay | Admitting: *Deleted

## 2019-12-14 ENCOUNTER — Other Ambulatory Visit: Payer: Self-pay

## 2019-12-14 ENCOUNTER — Emergency Department (HOSPITAL_BASED_OUTPATIENT_CLINIC_OR_DEPARTMENT_OTHER): Payer: Medicare Other

## 2019-12-14 DIAGNOSIS — R9389 Abnormal findings on diagnostic imaging of other specified body structures: Secondary | ICD-10-CM | POA: Insufficient documentation

## 2019-12-14 DIAGNOSIS — I959 Hypotension, unspecified: Secondary | ICD-10-CM | POA: Diagnosis present

## 2019-12-14 DIAGNOSIS — Z79899 Other long term (current) drug therapy: Secondary | ICD-10-CM | POA: Insufficient documentation

## 2019-12-14 DIAGNOSIS — J189 Pneumonia, unspecified organism: Secondary | ICD-10-CM | POA: Diagnosis not present

## 2019-12-14 DIAGNOSIS — R05 Cough: Secondary | ICD-10-CM | POA: Diagnosis not present

## 2019-12-14 DIAGNOSIS — Q839 Congenital malformation of breast, unspecified: Secondary | ICD-10-CM | POA: Diagnosis not present

## 2019-12-14 DIAGNOSIS — N649 Disorder of breast, unspecified: Secondary | ICD-10-CM | POA: Insufficient documentation

## 2019-12-14 DIAGNOSIS — E039 Hypothyroidism, unspecified: Secondary | ICD-10-CM | POA: Insufficient documentation

## 2019-12-14 DIAGNOSIS — Z7982 Long term (current) use of aspirin: Secondary | ICD-10-CM | POA: Insufficient documentation

## 2019-12-14 DIAGNOSIS — R059 Cough, unspecified: Secondary | ICD-10-CM

## 2019-12-14 LAB — CBC WITH DIFFERENTIAL/PLATELET
Abs Immature Granulocytes: 0.01 10*3/uL (ref 0.00–0.07)
Basophils Absolute: 0.1 10*3/uL (ref 0.0–0.1)
Basophils Relative: 2 %
Eosinophils Absolute: 0.2 10*3/uL (ref 0.0–0.5)
Eosinophils Relative: 4 %
HCT: 35.6 % — ABNORMAL LOW (ref 36.0–46.0)
Hemoglobin: 11.7 g/dL — ABNORMAL LOW (ref 12.0–15.0)
Immature Granulocytes: 0 %
Lymphocytes Relative: 24 %
Lymphs Abs: 1.3 10*3/uL (ref 0.7–4.0)
MCH: 32.1 pg (ref 26.0–34.0)
MCHC: 32.9 g/dL (ref 30.0–36.0)
MCV: 97.5 fL (ref 80.0–100.0)
Monocytes Absolute: 0.6 10*3/uL (ref 0.1–1.0)
Monocytes Relative: 11 %
Neutro Abs: 3.1 10*3/uL (ref 1.7–7.7)
Neutrophils Relative %: 59 %
Platelets: 218 10*3/uL (ref 150–400)
RBC: 3.65 MIL/uL — ABNORMAL LOW (ref 3.87–5.11)
RDW: 12.9 % (ref 11.5–15.5)
WBC: 5.3 10*3/uL (ref 4.0–10.5)
nRBC: 0 % (ref 0.0–0.2)

## 2019-12-14 LAB — COMPREHENSIVE METABOLIC PANEL
ALT: 14 U/L (ref 0–44)
AST: 20 U/L (ref 15–41)
Albumin: 3.8 g/dL (ref 3.5–5.0)
Alkaline Phosphatase: 45 U/L (ref 38–126)
Anion gap: 9 (ref 5–15)
BUN: 18 mg/dL (ref 8–23)
CO2: 28 mmol/L (ref 22–32)
Calcium: 8.9 mg/dL (ref 8.9–10.3)
Chloride: 101 mmol/L (ref 98–111)
Creatinine, Ser: 0.77 mg/dL (ref 0.44–1.00)
GFR calc Af Amer: >60 mL/min (ref >60)
GFR calc non Af Amer: >60 mL/min (ref >60)
Glucose, Bld: 94 mg/dL (ref 70–99)
Potassium: 4 mmol/L (ref 3.5–5.1)
Sodium: 138 mmol/L (ref 135–145)
Total Bilirubin: 0.8 mg/dL (ref 0.3–1.2)
Total Protein: 7.2 g/dL (ref 6.5–8.1)

## 2019-12-14 LAB — URINALYSIS, ROUTINE W REFLEX MICROSCOPIC
Bilirubin Urine: NEGATIVE
Glucose, UA: NEGATIVE mg/dL
Hgb urine dipstick: NEGATIVE
Ketones, ur: NEGATIVE mg/dL
Leukocytes,Ua: NEGATIVE
Nitrite: NEGATIVE
Protein, ur: NEGATIVE mg/dL
Specific Gravity, Urine: <1.005 — ABNORMAL LOW (ref 1.005–1.030)
pH: 6.5 (ref 5.0–8.0)

## 2019-12-14 LAB — BRAIN NATRIURETIC PEPTIDE: B Natriuretic Peptide: 81.3 pg/mL (ref 0.0–100.0)

## 2019-12-14 LAB — TSH: TSH: 2.979 u[IU]/mL (ref 0.350–4.500)

## 2019-12-14 LAB — TROPONIN I (HIGH SENSITIVITY): Troponin I (High Sensitivity): 3 ng/L (ref <18)

## 2019-12-14 MED ORDER — AMOXICILLIN 500 MG PO CAPS
1000.0000 mg | ORAL_CAPSULE | Freq: Two times a day (BID) | ORAL | 0 refills | Status: DC
Start: 2019-12-14 — End: 2020-01-04

## 2019-12-14 MED FILL — AMOXICILLIN 500 MG CAPSULE: 500 | 10 days supply | Qty: 40 | Fill #0

## 2019-12-14 NOTE — Assessment & Plan Note (Addendum)
I need pulmonary input.  I did not order CT yet.  If pulmonary assesses that this needs to be done prior to office visit I can set that up.  I appreciate pulmonary input.  Baseline labs checked today.

## 2019-12-14 NOTE — Assessment & Plan Note (Signed)
We will see about getting an adjustable bed for her husband.  That would likely help her care for him.

## 2019-12-14 NOTE — ED Notes (Signed)
Pt on monitor 

## 2019-12-14 NOTE — ED Triage Notes (Signed)
Pt is here with c.o hypotension. She feels weak, cough, cold skin and dizziness. She was seen for same at [facility] 7/2.

## 2019-12-14 NOTE — Assessment & Plan Note (Signed)
Needs mammogram/imaging.  Ordered.  She agrees.

## 2019-12-14 NOTE — ED Provider Notes (Signed)
[facility] Provider Note   CSN: [id number] Arrival date & time: 12/14/19  1028     History Chief Complaint  Patient presents with  . Hypotension    Tara Harris is a 86 y.o. female.   Illness Quality:  Intermittent hypotension in the am, persistent cough Severity:  Moderate Timing:  Intermittent Progression:  Waxing and waning Chronicity:  New Context:  Unclear origin, getting eval as an outpatient Relieved by:  Getting up and walking Worsened by:  Getting up out of bed in the am Ineffective treatments:  None tried Associated symptoms: cough   Associated symptoms: no chest pain, no congestion, no diarrhea, no fever, no headaches, no loss of consciousness, no nausea, no rash, no rhinorrhea, no shortness of breath and no vomiting        Past Medical History:  Diagnosis Date  . Arthritis   . High cholesterol   . IBS (irritable bowel syndrome)   . Menopause   . Thyroid disease   . Tubular adenoma 01/13/2007    Patient Active Problem List   Diagnosis Date Noted  . Abnormal chest x-ray 12/14/2019  . Abnormal nipple 12/14/2019  . Neuropathy 08/03/2019  . Caregiver role strain 02/26/2019  . Advance care planning 02/26/2019  . Unsteady gait 05/22/2018  . Age-related osteoporosis without current pathological fracture 05/22/2018  . Hyperglycemia 07/03/2015  . Nocturnal leg cramps 07/03/2015  . Hypothyroidism 02/20/2015  . Urinary incontinence 05/31/2011  . UNSPECIFIED VITAMIN D DEFICIENCY 01/07/2010  . ARTHRITIS, LEFT KNEE 01/07/2010  . IRRITABLE BOWEL SYNDROME 08/07/2009  . Hyperlipidemia 12/26/2007  . ALLERGIC RHINITIS, CHRONIC 12/26/2007  . ARTHRITIS 12/26/2007    Past Surgical History:  Procedure Laterality Date  . biopsy left leg    . CATARACT EXTRACTION, BILATERAL  2012  . COLONOSCOPY    . TONSILLECTOMY       OB History   No obstetric history on file.     Family History  Problem Relation Age of Onset  .  Alzheimer's disease Mother   . Heart attack Father   . Alzheimer's disease Father   . Heart attack Sister   . Kidney disease Brother   . Colon cancer Neg Hx   . Stomach cancer Neg Hx   . Breast cancer Neg Hx     Social History   Tobacco Use  . Smoking status: Never Smoker  . Smokeless tobacco: Never Used  Vaping Use  . Vaping Use: Never used  Substance Use Topics  . Alcohol use: No  . Drug use: No    Home Medications Prior to Admission medications   Medication Sig Start Date End Date Taking? Authorizing Provider  aspirin 81 MG tablet Take 81 mg by mouth daily after breakfast.    Yes [provider]  atorvastatin (LIPITOR) 10 MG tablet TAKE 1 TABLET DAILY AT 6 P.M. 10/19/18  Yes Reed, Tiffany L, DO  Cholecalciferol (VITAMIN D) 50 MCG (2000 UT) CAPS Take 1 capsule (2,000 Units total) by mouth daily. 05/22/18  Yes Reed, Tiffany L, DO  levothyroxine (SYNTHROID) 50 MCG tablet Take 50 mcg by mouth daily on empty stomach. 07/15/19  Yes Tonia Ghent, MD  Multiple Vitamins-Minerals (MULTIVITAMIN WITH MINERALS) tablet Take 1 tablet by mouth daily after breakfast.    Yes [provider]  polyethylene glycol (MIRALAX / GLYCOLAX) packet Take 17 g by mouth daily as needed for mild constipation or moderate constipation.    Yes [provider]  amoxicillin (AMOXIL) 500  MG capsule Take 2 capsules (1,000 mg total) by mouth 2 (two) times daily. 12/14/19   Breck Coons, MD    Allergies    Patient has no known allergies.  Review of Systems   Review of Systems  Constitutional: Negative for chills and fever.  HENT: Negative for congestion and rhinorrhea.   Respiratory: Positive for cough. Negative for shortness of breath.   Cardiovascular: Negative for chest pain and palpitations.  Gastrointestinal: Negative for diarrhea, nausea and vomiting.  Genitourinary: Negative for difficulty urinating and dysuria.  Musculoskeletal: Negative for arthralgias and back pain.  Skin:  Negative for color change, rash and wound.  Neurological: Negative for loss of consciousness, light-headedness and headaches.    Physical Exam Updated Vital Signs BP (!) 91/53 (BP Location: Right Arm)   Pulse 74   Temp 98 F (36.7 C) (Oral)   Resp 20   Ht 5\' 6"  (1.676 m)   Wt 51.5 kg   SpO2 100%   BMI 18.33 kg/m   Physical Exam Vitals and nursing note reviewed. Exam conducted with a chaperone present.  Constitutional:      General: She is not in acute distress.    Appearance: Normal appearance.  HENT:     Head: Normocephalic and atraumatic.     Nose: No rhinorrhea.  Eyes:     General:        Right eye: No discharge.        Left eye: No discharge.     Conjunctiva/sclera: Conjunctivae normal.  Cardiovascular:     Rate and Rhythm: Normal rate. Rhythm irregular.  Pulmonary:     Effort: Pulmonary effort is normal. No respiratory distress.     Breath sounds: No stridor. No rhonchi.  Chest:     Chest wall: No tenderness.    Abdominal:     General: Abdomen is flat. There is no distension.     Palpations: Abdomen is soft.  Musculoskeletal:        General: No tenderness or signs of injury.  Skin:    General: Skin is warm and dry.  Neurological:     General: No focal deficit present.     Mental Status: She is alert. Mental status is at baseline.     Motor: No weakness.  Psychiatric:        Mood and Affect: Mood normal.        Behavior: Behavior normal.     ED Results / Procedures / Treatments   Labs (all labs ordered are listed, but only abnormal results are displayed) Labs Reviewed  URINALYSIS, ROUTINE W REFLEX MICROSCOPIC - Abnormal; Notable for the following components:      Result Value   Specific Gravity, Urine <1.005 (*)    All other components within normal limits  CBC WITH DIFFERENTIAL/PLATELET - Abnormal; Notable for the following components:   RBC 3.65 (*)    Hemoglobin 11.7 (*)    HCT 35.6 (*)    All other components within normal limits    COMPREHENSIVE METABOLIC PANEL  BRAIN NATRIURETIC PEPTIDE  TSH  TROPONIN I (HIGH SENSITIVITY)    EKG EKG Interpretation  Date/Time:  Friday December 14 2019 11:22:07 EDT Ventricular Rate:  78 PR Interval:    QRS Duration: 89 QT Interval:  367 QTC Calculation: 418 R Axis:   46 Text Interpretation: Sinus rhythm Atrial premature complex Confirmed by Dewaine Conger [phone]) on 12/14/2019 12:28:29 PM   Radiology CT Chest Wo Contrast  Result Date: 12/14/2019 CLINICAL DATA:  Cough  and hypotension EXAM: CT CHEST WITHOUT CONTRAST TECHNIQUE: Multidetector CT imaging of the chest was performed following the standard protocol without IV contrast. COMPARISON:  Chest radiograph December 07, 2019. Chest CT January 21, 2017; chest radiograph January 25, 2017 FINDINGS: Cardiovascular: There is no demonstrable thoracic aortic aneurysm. There are occasional foci of calcification in visualized great vessels. There are foci of aortic atherosclerosis. There are foci of coronary artery calcification. There is a small amount of pericardial fluid, felt to be within physiologic range. There is no pericardial thickening evident. Mediastinum/Nodes: There is a mass in the isthmus of the thyroid measuring 1.2 x 1.0 cm which appears slightly smaller than on the previous study. Per consensus guidelines, a mass of this size in this patient age does not warrant additional surveillance. There are scattered subcentimeter mediastinal lymph nodes. No adenopathy is appreciable by size criteria. There is a fairly small focal hiatal hernia. Lungs/Pleura: There is underlying centrilobular and paraseptal emphysematous change. There is stable scarring in the apices. There is extensive fibrosis throughout the lungs bilaterally, similar overall compared to the previous study. In comparison with the previous study, there is a small focus of airspace opacity in the superior segment right lower lobe which is concerning for potential small focus of pneumonia.  Areas of bronchiectatic change bilaterally remains stable. There are no appreciable pleural effusions. Upper Abdomen: There is upper abdominal aortic atherosclerosis. There is a cyst in the upper pole left kidney measuring 1.7 x 1.7 cm. Visualized upper abdominal structures otherwise appear unremarkable. Musculoskeletal: There are no blastic or lytic bone lesions. No chest wall lesions are evident. IMPRESSION: 1. Extensive underlying emphysematous change with scarring and fibrosis. Fibrosis is most severe in the lower lung regions and may have progressed slightly compared to the prior CT examination. There also stable foci of bronchiectatic change bilaterally. 2. Suspect small focus of pneumonia in the superior segment right lower lobe. 3.  Subcentimeter lymph nodes without adenopathy by size criteria. 4.  Fairly small hiatal hernia. 5. Aortic atherosclerosis. There are foci of great vessel and coronary artery calcification. Aortic Atherosclerosis (ICD10-I70.0) and Emphysema (ICD10-J43.9). Electronically Signed   By: Lowella Grip III M.D.   On: 12/14/2019 12:29    Procedures Procedures (including critical care time)  Medications Ordered in ED Medications - No data to display  ED Course  I have reviewed the triage vital signs and the nursing notes.  Pertinent labs & imaging results that were available during my care of the patient were reviewed by me and considered in my medical decision making (see chart for details).    MDM Rules/Calculators/A&P                          86 year old female comes in with multiple complaints, she has had intermittent episodes of hypotension upon waking up in the morning, quickly resolved with position change and ambulation.  She has no symptoms throughout the day.  Her doctors have been doing testing as an outpatient and found nothing yet.  She is well-appearing.  Vital signs are stable she is afebrile no hypotension here.  She is had a persistent nagging cough  as well but no infectious signs or symptoms to include fever chills or productive cough.  She had a chest x-ray that showed abnormalities.  CT scan will be done here today to get more detail.  Physical exam reveals concerning signs on the left breast, to include a inverted nipple and crusting, with her  intermittent hypotension in cough I have concerns for potential malignancy, she will need outpatient work-up, her doctors already scheduled a mammogram.  There is no concerning findings on the CT scan after radiology my review for metastatic disease however there are some changes to include a focal area of pneumonia that will be treated with outpatient oral antibiotics.  Laboratory studies are unremarkable to include a negative troponin and BNP.  Her EKG reviewed by me shows no acute ischemic change interval abnormality or arrhythmia.  I had a long conversation with the family about further work-up needed, they agree they do not need admission at this time has been going on for months they feel that is stable with the work-up that they have had done today.  They feel comfortable with outpatient follow-up, I will treat him with antibiotics, and recommend they do get their mammogram and evaluate this abnormal breast change.  Family agrees this plan and they feel safe for discharge home.  Strict return precautions are given.  Vital signs stable time of discharge. Final Clinical Impression(s) / ED Diagnoses Final diagnoses:  Nipple anomaly  Cough  Community acquired pneumonia, unspecified laterality    Rx / DC Orders ED Discharge Orders         Ordered    amoxicillin (AMOXIL) 500 MG capsule  2 times daily     Discontinue  Reprint     12/14/19 1258           Breck Coons, MD 12/14/19 1304

## 2019-12-17 ENCOUNTER — Encounter: Payer: Self-pay | Admitting: Family Medicine

## 2019-12-24 ENCOUNTER — Other Ambulatory Visit: Payer: Self-pay

## 2019-12-24 NOTE — Telephone Encounter (Signed)
She has switched to Dr. Damita Dunnings.

## 2019-12-24 NOTE — Telephone Encounter (Signed)
Michael with [facility] states he doesn't have the medication on file for the patient and would need a new Rx sent to him. Routing to Dr. Mariea Clonts to confirm patient is a current patient here, PCP is listed under Dr. Damita Dunnings.

## 2019-12-26 ENCOUNTER — Ambulatory Visit
Admission: RE | Admit: 2019-12-26 | Discharge: 2019-12-26 | Disposition: A | Discharge status: Home / Self Care | Payer: Medicare Other | Source: Ambulatory Visit | Attending: Family Medicine | Admitting: Family Medicine

## 2019-12-26 ENCOUNTER — Other Ambulatory Visit: Payer: Self-pay

## 2019-12-26 ENCOUNTER — Other Ambulatory Visit: Payer: Self-pay | Admitting: Family Medicine

## 2019-12-26 DIAGNOSIS — N649 Disorder of breast, unspecified: Secondary | ICD-10-CM

## 2019-12-26 DIAGNOSIS — R928 Other abnormal and inconclusive findings on diagnostic imaging of breast: Secondary | ICD-10-CM

## 2019-12-27 ENCOUNTER — Encounter: Payer: Self-pay | Admitting: Family Medicine

## 2019-12-28 ENCOUNTER — Telehealth: Payer: Self-pay | Admitting: Family Medicine

## 2019-12-28 NOTE — Telephone Encounter (Signed)
Pt called and has several questions about her medication.  [facility] transfer [facility] to [facility]  Needing advise

## 2019-12-31 ENCOUNTER — Other Ambulatory Visit: Payer: Self-pay | Admitting: *Deleted

## 2019-12-31 MED ORDER — ATORVASTATIN CALCIUM 10 MG PO TABS: ORAL_TABLET | ORAL | 3 refills | Status: DC

## 2019-12-31 NOTE — Telephone Encounter (Signed)
Patient says they are trying to get all of their prescriptions transferred to [facility] and asked that I send the Atorvastatin at this time.  Atorvastatin was sent to the pharmcy as requested.

## 2020-01-04 ENCOUNTER — Encounter: Payer: Self-pay | Admitting: Pulmonary Disease

## 2020-01-04 ENCOUNTER — Ambulatory Visit (INDEPENDENT_AMBULATORY_CARE_PROVIDER_SITE_OTHER): Payer: Medicare Other | Admitting: Pulmonary Disease

## 2020-01-04 ENCOUNTER — Other Ambulatory Visit: Payer: Self-pay

## 2020-01-04 VITALS — BP 124/62 | HR 85 | Temp 98.2°F | Ht 66.0 in | Wt 111.6 lb

## 2020-01-04 DIAGNOSIS — J849 Interstitial pulmonary disease, unspecified: Secondary | ICD-10-CM | POA: Diagnosis not present

## 2020-01-04 NOTE — Patient Instructions (Signed)
We will labs for evaluation of interstitial lung disease and pulmonary function test Follow-up in clinic in 2 to 3 months.

## 2020-01-04 NOTE — Progress Notes (Signed)
Tara Harris    [id number]    11-17-1931  Primary Care Physician:Duncan, Elveria Rising, MD  Referring Physician: Tonia Ghent, MD [address] [place],  [place] [address]  Chief complaint: Consult for abnormal chest imaging, pulmonary fibrosis  HPI: 86 year old with history of allergies, chronic cough for the past 2 to 3 years Referred for abnormal chest imaging showing pulmonary fibrosis Denies any dyspnea  Patient states that she does not have any dyspnea and feels she does not need any evaluation  Pets: Cats.  She had a dog in the past.  No birds Occupation: Retired Surveyor, minerals Exposures: No known exposures.  No mold, hot tub, Jacuzzi.  No down pillows or comforter Smoking history: Never smoker Travel history: No significant travel history Relevant family history: No significant family history of lung disease   Outpatient Encounter Medications as of 01/04/2020  Medication Sig  . aspirin 81 MG tablet Take 81 mg by mouth daily after breakfast.   . atorvastatin (LIPITOR) 10 MG tablet TAKE 1 TABLET DAILY AT 6 P.M.  . Cholecalciferol (VITAMIN D) 50 MCG (2000 UT) CAPS Take 1 capsule (2,000 Units total) by mouth daily.  Marland Kitchen levothyroxine (SYNTHROID) 50 MCG tablet Take 50 mcg by mouth daily on empty stomach.  . Multiple Vitamins-Minerals (MULTIVITAMIN WITH MINERALS) tablet Take 1 tablet by mouth daily after breakfast.   . polyethylene glycol (MIRALAX / GLYCOLAX) packet Take 17 g by mouth daily as needed for mild constipation or moderate constipation.   . [DISCONTINUED] amoxicillin (AMOXIL) 500 MG capsule Take 2 capsules (1,000 mg total) by mouth 2 (two) times daily.   No facility-administered encounter medications on file as of 01/04/2020.    Allergies as of 01/04/2020  . (No Known Allergies)    Past Medical History:  Diagnosis Date  . Arthritis   . High cholesterol   . IBS (irritable bowel syndrome)   . Menopause   . Thyroid disease    . Tubular adenoma 01/13/2007    Past Surgical History:  Procedure Laterality Date  . biopsy left leg    . CATARACT EXTRACTION, BILATERAL  2012  . COLONOSCOPY    . TONSILLECTOMY      Family History  Problem Relation Age of Onset  . Alzheimer's disease Mother   . Heart attack Father   . Alzheimer's disease Father   . Heart attack Sister   . Kidney disease Brother   . Colon cancer Neg Hx   . Stomach cancer Neg Hx   . Breast cancer Neg Hx     Social History   Socioeconomic History  . Marital status: Married    Spouse name: Not on file  . Number of children: Not on file  . Years of education: Not on file  . Highest education level: Not on file  Occupational History  . Not on file  Tobacco Use  . Smoking status: Never Smoker  . Smokeless tobacco: Never Used  Vaping Use  . Vaping Use: Never used  Substance and Sexual Activity  . Alcohol use: No  . Drug use: No  . Sexual activity: Not on file  Other Topics Concern  . Not on file  Social History Narrative   Marital status:  Married, husband with dementia.     What year were you married ? 1961   Do you live in a house, apartment,assistred living, condo, trailer, etc.)? House   Is it one or  more stories? One story    How many persons live in your home ? Two   Current or past profession:  Museum/gallery curator, Primary school teacher   Social Determinants of Health   Financial Resource Strain:   . Difficulty of Paying Living Expenses:   Food Insecurity:   . Worried About Charity fundraiser in the Last Year:   . Arboriculturist in the Last Year:   Transportation Needs:   . Film/video editor (Medical):   Marland Kitchen Lack of Transportation (Non-Medical):   Physical Activity:   . Days of Exercise per Week:   . Minutes of Exercise per Session:   Stress:   . Feeling of Stress :   Social Connections:   . Frequency of Communication with Friends and Family:   . Frequency of Social Gatherings with Friends and Family:   . Attends  Religious Services:   . Active Member of Clubs or Organizations:   . Attends Archivist Meetings:   Marland Kitchen Marital Status:   Intimate Partner Violence:   . Fear of Current or Ex-Partner:   . Emotionally Abused:   Marland Kitchen Physically Abused:   . Sexually Abused:     Review of systems: Review of Systems  Constitutional: Negative for fever and chills.  HENT: Negative.   Eyes: Negative for blurred vision.  Respiratory: as per HPI  Cardiovascular: Negative for chest pain and palpitations.  Gastrointestinal: Negative for vomiting, diarrhea, blood per rectum. Genitourinary: Negative for dysuria, urgency, frequency and hematuria.  Musculoskeletal: Negative for myalgias, back pain and joint pain.  Skin: Negative for itching and rash.  Neurological: Negative for dizziness, tremors, focal weakness, seizures and loss of consciousness.  Endo/Heme/Allergies: Negative for environmental allergies.  Psychiatric/Behavioral: Negative for depression, suicidal ideas and hallucinations.  All other systems reviewed and are negative.  Physical Exam: Blood pressure (!) 124/62, pulse 85, temperature 98.2 F (36.8 C), temperature source Oral, height 5\' 6"  (1.676 m), weight 111 lb 9.6 oz (50.6 kg), SpO2 99 %. Gen:      No acute distress HEENT:  EOMI, sclera anicteric Neck:     No masses; no thyromegaly Lungs:    Bibasal crackles CV:         Regular rate and rhythm; no murmurs Abd:      + bowel sounds; soft, non-tender; no palpable masses, no distension Ext:    No edema; adequate peripheral perfusion Skin:      Warm and dry; no rash Neuro: alert and oriented x 3 Psych: normal mood and affect  Data Reviewed: Imaging: CT chest 12/14/2019-pulmonary fibrosis, no significant gradient with honeycombing.  Clearly progressed since 2017 Probable UIP pattern.  I have reviewed the images personally  PFTs:  Labs:  Assessment:  Pulmonary fibrosis My impression of CT scan as its probable UIP pattern with  progression since 2017.  Likely IPF There is read of emphysema on the CT but I do not see it She does not have any exposures or connective tissue disease symptoms  Discussed findings with patient. She does not want any invasive testing or treatment with antifibrotics.  She feels her breathing is doing well and is more focused on taking care of her husband who is declining with dementia We have agreed to do CTD serologies and exposure evaluation to see if there is any reversible process.  Plan/Recommendations: CTD serologies, ILD exposure questionnaire PFTs Follow-up in 2 to 3 months.  Marshell Garfinkel MD [facility] 01/04/2020, 9:58 AM  CC: Tonia Ghent, MD

## 2020-01-08 ENCOUNTER — Other Ambulatory Visit: Payer: Self-pay

## 2020-01-08 ENCOUNTER — Ambulatory Visit (INDEPENDENT_AMBULATORY_CARE_PROVIDER_SITE_OTHER)
Admission: RE | Admit: 2020-01-08 | Discharge: 2020-01-08 | Disposition: A | Discharge status: Home / Self Care | Payer: Medicare Other | Source: Ambulatory Visit | Attending: Family Medicine | Admitting: Family Medicine

## 2020-01-08 ENCOUNTER — Ambulatory Visit (INDEPENDENT_AMBULATORY_CARE_PROVIDER_SITE_OTHER): Payer: Medicare Other | Admitting: Family Medicine

## 2020-01-08 ENCOUNTER — Encounter: Payer: Self-pay | Admitting: Family Medicine

## 2020-01-08 DIAGNOSIS — M545 Low back pain, unspecified: Secondary | ICD-10-CM

## 2020-01-08 LAB — ANA,IFA RA DIAG PNL W/RFLX TIT/PATN
Anti Nuclear Antibody (ANA): POSITIVE — AB
Cyclic Citrullin Peptide Ab: <16 UNITS
Rheumatoid fact SerPl-aCnc: <14 IU/mL (ref <14)

## 2020-01-08 LAB — ANCA SCREEN W REFLEX TITER
ANCA Screen: POSITIVE — AB
P-ANCA Titer: 1:40 {titer} — ABNORMAL HIGH

## 2020-01-08 LAB — SJOGREN'S SYNDROME ANTIBODS(SSA + SSB)
SSA (Ro) (ENA) Antibody, IgG: <1 AI
SSB (La) (ENA) Antibody, IgG: <1 AI

## 2020-01-08 LAB — ANTI-SCLERODERMA ANTIBODY: Scleroderma (Scl-70) (ENA) Antibody, IgG: <1 AI

## 2020-01-08 LAB — ANTI-NUCLEAR AB-TITER (ANA TITER)
ANA TITER: 1:40 {titer} — ABNORMAL HIGH
ANA Titer 1: 1:40 {titer} — ABNORMAL HIGH

## 2020-01-08 MED ORDER — TIZANIDINE HCL 4 MG PO TABS
4.0000 mg | ORAL_TABLET | Freq: Four times a day (QID) | ORAL | 0 refills | Status: DC | PRN
Start: 2020-01-08 — End: 2020-02-21
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
## 2020-01-08 NOTE — Assessment & Plan Note (Signed)
Worsened after pulling/pushing to care with husband  Has had at least one fall as well  On exam-some mild upper lumbar tenderness (bone) Also tight musculature  Xray obtained  inst to continue tylenol if helpful  Heat/ice  And slow walking  tizanadine px to help acutely with pain and spasm  Family and an aide will help so she does not hurt herself with husband

## 2020-01-08 NOTE — Patient Instructions (Signed)
Use heat/ice on your back   Slow walking is good  Tylenol is fine  Try the tizanidine (muscle relaxer) with caution of sedation   Xray now  We will contact you with result and plan

## 2020-01-09 ENCOUNTER — Telehealth: Payer: Self-pay | Admitting: *Deleted

## 2020-01-09 LAB — HYPERSENSITIVITY PNEUMONITIS
A. Pullulans Abs: NEGATIVE
A.Fumigatus #1 Abs: NEGATIVE
Micropolyspora faeni, IgG: NEGATIVE
Pigeon Serum Abs: NEGATIVE
Thermoact. Saccharii: NEGATIVE
Thermoactinomyces vulgaris, IgG: NEGATIVE

## 2020-01-09 NOTE — Telephone Encounter (Signed)
Left VM requesting pt to call the office back regarding xray results 

## 2020-01-14 ENCOUNTER — Encounter: Payer: Self-pay | Admitting: Family Medicine

## 2020-01-15 ENCOUNTER — Ambulatory Visit: Payer: Self-pay | Admitting: Pulmonary Disease

## 2020-01-18 LAB — MYOMARKER 3 PLUS PROFILE (RDL)
Anti-EJ Ab (RDL): NEGATIVE
Anti-Jo-1 Ab (RDL): <20 Units (ref <20)
Anti-Ku Ab (RDL): NEGATIVE
Anti-MDA-5 Ab (CADM-140)(RDL): <20 Units (ref <20)
Anti-Mi-2 Ab (RDL): NEGATIVE
Anti-NXP-2 (P140) Ab (RDL): <20 Units (ref <20)
Anti-OJ Ab (RDL): NEGATIVE
Anti-PL-12 Ab (RDL: POSITIVE — AB
Anti-PL-7 Ab (RDL): NEGATIVE
Anti-PM/Scl-100 Ab (RDL): <20 Units (ref <20)
Anti-SAE1 Ab, IgG (RDL): <20 Units (ref <20)
Anti-SRP Ab (RDL): NEGATIVE
Anti-SS-A 52kD Ab, IgG (RDL): <20 Units (ref <20)
Anti-TIF-1gamma Ab (RDL): <20 Units (ref <20)
Anti-U1 RNP Ab (RDL): <20 Units (ref <20)
Anti-U2 RNP Ab (RDL): NEGATIVE
Anti-U3 RNP (Fibrillarin)(RDL): NEGATIVE

## 2020-01-20 NOTE — Progress Notes (Addendum)
   Interstitial Lung Disease Multidisciplinary Conference   Tara Harris    MRN [id number]    DOB [birth year]  Primary Care Physician:Duncan, Elveria Rising, MD  Referring Physician: Dr. Marshell Garfinkel MD  Time of Conference: 7.30am- 8.30am Date of conference: 01/14/2013 Location of Conference:- Virtual  Participating Pulmonary:Dr Marshell Garfinkel, MD Pathology: Dr Jaquita Folds, MD Radiology: Dr Eddie Candle  Others:  Brief History:  Pulmonary fibrosis with cough. No exposure history, CTD serologies are positive as noted below Review scan to determine pattern per ATS criteria  Serology:  01/04/20- moderate positive for PL12, p-ANCA and ANA 1:40 cytoplasmic  MDD discussion of CT scan   CT chest 12/14/2019-bibasilar predominant pulmonary fibrosis with traction bronchiectasis, bronchiolectasis with honeycombing noted.  No emphysema noted although it was reported on the radiology read.  Unable to assess clearly but it may be worse compared to 2018    - What is the final conclusion per 2018 ATS/Fleischner Criteria - Definite UIP  Pathology discussion of biopsy: None  PFTs: Pending  MDD Impression/Recs:  Definite UIP pattern of pulmonary fibrosis on CT scan.  There is no emphysema. 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  Recommend checking CK, aldolase and referral to rheumatology.   Time Spent in preparation and discussion:  > 30 min    SIGNATURE   Denita Lun MD [facility] Please see [email or web address] for pager details.  01/20/2020, 8:37 AM

## 2020-01-21 ENCOUNTER — Other Ambulatory Visit: Payer: Self-pay | Admitting: Pulmonary Disease

## 2020-01-21 DIAGNOSIS — R0602 Shortness of breath: Secondary | ICD-10-CM

## 2020-01-24 ENCOUNTER — Emergency Department (HOSPITAL_BASED_OUTPATIENT_CLINIC_OR_DEPARTMENT_OTHER)
Admission: EM | Admit: 2020-01-24 | Discharge: 2020-01-24 | Disposition: A | Discharge status: Home / Self Care | Payer: Medicare Other | Attending: Emergency Medicine | Admitting: Emergency Medicine

## 2020-01-24 ENCOUNTER — Encounter (HOSPITAL_BASED_OUTPATIENT_CLINIC_OR_DEPARTMENT_OTHER): Payer: Self-pay

## 2020-01-24 ENCOUNTER — Emergency Department (HOSPITAL_BASED_OUTPATIENT_CLINIC_OR_DEPARTMENT_OTHER): Payer: Medicare Other

## 2020-01-24 ENCOUNTER — Other Ambulatory Visit: Payer: Self-pay

## 2020-01-24 DIAGNOSIS — Y9389 Activity, other specified: Secondary | ICD-10-CM | POA: Diagnosis not present

## 2020-01-24 DIAGNOSIS — E039 Hypothyroidism, unspecified: Secondary | ICD-10-CM | POA: Insufficient documentation

## 2020-01-24 DIAGNOSIS — Y9289 Other specified places as the place of occurrence of the external cause: Secondary | ICD-10-CM | POA: Diagnosis not present

## 2020-01-24 DIAGNOSIS — W19XXXA Unspecified fall, initial encounter: Secondary | ICD-10-CM

## 2020-01-24 DIAGNOSIS — Y999 Unspecified external cause status: Secondary | ICD-10-CM | POA: Insufficient documentation

## 2020-01-24 DIAGNOSIS — W1809XA Striking against other object with subsequent fall, initial encounter: Secondary | ICD-10-CM | POA: Diagnosis not present

## 2020-01-24 DIAGNOSIS — S0990XA Unspecified injury of head, initial encounter: Secondary | ICD-10-CM | POA: Diagnosis present

## 2020-01-24 DIAGNOSIS — S0001XA Abrasion of scalp, initial encounter: Secondary | ICD-10-CM | POA: Insufficient documentation

## 2020-01-24 NOTE — ED Notes (Signed)
ED Provider at bedside. 

## 2020-01-24 NOTE — Discharge Instructions (Addendum)
Okay to wash your hair with soap and water.  Apply antibiotic ointment twice a day to the abrasion on the top of your head.  Head CT without any acute findings.

## 2020-01-24 NOTE — ED Triage Notes (Signed)
Pt arrives with daughter who reports that pt does live alone and had a fall today. Pt states that she thinks she did have LOC, was able to call daughter to come get her. Daughter thinks she may have hit the dresser. Pt takes Asprin , no  Other blood thinners.

## 2020-01-24 NOTE — ED Provider Notes (Signed)
[name] Provider Note   CSN: [id number] Arrival date & time: 01/24/20  8329     History Chief Complaint  Patient presents with   Lytle Michaels    Tara Harris is a 86 y.o. female.  Patient arrives with her daughter.  Patient lives alone patient had a fall today striking the side of the dresser.  Not clear whether she had any loss of consciousness or not but patient does remember falling..  Patient not on any blood thinners.  But does take aspirin daily.  Denies any neck pain back pain hip pain lower extremity pain upper extremity pain.  No significant headache.  No nausea or vomiting.  Patient was some sort of head wound.  Extensive bleeding.  Patient states tetanus up-to-date.        Past Medical History:  Diagnosis Date   Arthritis    High cholesterol    IBS (irritable bowel syndrome)    Menopause    Thyroid disease    Tubular adenoma 01/13/2007    Patient Active Problem List   Diagnosis Date Noted   Low back pain 01/08/2020   Abnormal chest x-ray 12/14/2019   Abnormal nipple 12/14/2019   Neuropathy 08/03/2019   Caregiver role strain 02/26/2019   Advance care planning 02/26/2019   Unsteady gait 05/22/2018   Age-related osteoporosis without current pathological fracture 05/22/2018   Hyperglycemia 07/03/2015   Nocturnal leg cramps 07/03/2015   Hypothyroidism 02/20/2015   Urinary incontinence 05/31/2011   UNSPECIFIED VITAMIN D DEFICIENCY 01/07/2010   ARTHRITIS, LEFT KNEE 01/07/2010   IRRITABLE BOWEL SYNDROME 08/07/2009   Hyperlipidemia 12/26/2007   ALLERGIC RHINITIS, CHRONIC 12/26/2007   ARTHRITIS 12/26/2007    Past Surgical History:  Procedure Laterality Date   biopsy left leg     CATARACT EXTRACTION, BILATERAL  2012   COLONOSCOPY     TONSILLECTOMY       OB History   No obstetric history on file.     Family History  Problem Relation Age of Onset   Alzheimer's disease Mother    Heart attack  Father    Alzheimer's disease Father    Heart attack Sister    Kidney disease Brother    Colon cancer Neg Hx    Stomach cancer Neg Hx    Breast cancer Neg Hx     Social History   Tobacco Use   Smoking status: Never Smoker   Smokeless tobacco: Never Used  Vaping Use   Vaping Use: Never used  Substance Use Topics   Alcohol use: No   Drug use: No    Home Medications Prior to Admission medications   Medication Sig Start Date End Date Taking? Authorizing Provider  acetaminophen (TYLENOL) 650 MG CR tablet Take 1,300 mg by mouth every 8 (eight) hours as needed for pain.    [provider]  aspirin 81 MG tablet Take 81 mg by mouth daily after breakfast.     [provider]  atorvastatin (LIPITOR) 10 MG tablet TAKE 1 TABLET DAILY AT 6 P.M. 12/31/19   Tonia Ghent, MD  Cholecalciferol (VITAMIN D) 50 MCG (2000 UT) CAPS Take 1 capsule (2,000 Units total) by mouth daily. 05/22/18   Reed, Tiffany L, DO  levothyroxine (SYNTHROID) 50 MCG tablet Take 50 mcg by mouth daily on empty stomach. 07/15/19   Tonia Ghent, MD  Multiple Vitamins-Minerals (MULTIVITAMIN WITH MINERALS) tablet Take 1 tablet by mouth daily after breakfast.     [provider]  polyethylene  glycol (MIRALAX / GLYCOLAX) packet Take 17 g by mouth daily as needed for mild constipation or moderate constipation.     [provider]  tiZANidine (ZANAFLEX) 4 MG tablet Take 1 tablet (4 mg total) by mouth every 6 (six) hours as needed for muscle spasms (back pain). Caution of sedation 01/08/20   Tower, Wynelle Fanny, MD    Allergies    Patient has no known allergies.  Review of Systems   Review of Systems  Constitutional: Negative for chills and fever.  HENT: Negative for rhinorrhea and sore throat.   Eyes: Negative for visual disturbance.  Respiratory: Negative for cough and shortness of breath.   Cardiovascular: Negative for chest pain and leg swelling.  Gastrointestinal: Negative for  abdominal pain, diarrhea, nausea and vomiting.  Genitourinary: Negative for dysuria.  Musculoskeletal: Negative for back pain and neck pain.  Skin: Positive for wound. Negative for rash.  Neurological: Negative for dizziness, light-headedness and headaches.  Hematological: Does not bruise/bleed easily.  Psychiatric/Behavioral: Negative for confusion.    Physical Exam Updated Vital Signs BP 117/72 (BP Location: Right Arm)    Pulse 70    Temp 97.7 F (36.5 C) (Oral)    Resp 16    Ht 1.676 m (5\' 6" )    Wt 50.3 kg    SpO2 100%    BMI 17.92 kg/m   Physical Exam Vitals and nursing note reviewed.  Constitutional:      General: She is not in acute distress.    Appearance: Normal appearance. She is well-developed.  HENT:     Head: Normocephalic.     Comments: The top of the head a little bit more to the left side with an area of abrasion measuring about 3 cm.  No laceration. Eyes:     Conjunctiva/sclera: Conjunctivae normal.     Pupils: Pupils are equal, round, and reactive to light.  Neck:     Comments: No posterior cervical spine tenderness to palpation. Cardiovascular:     Rate and Rhythm: Normal rate and regular rhythm.     Heart sounds: No murmur heard.   Pulmonary:     Effort: Pulmonary effort is normal. No respiratory distress.     Breath sounds: Normal breath sounds.  Abdominal:     Palpations: Abdomen is soft.     Tenderness: There is no abdominal tenderness.  Musculoskeletal:        General: No tenderness. Normal range of motion.     Cervical back: Neck supple. No rigidity or tenderness.     Comments: No pain with movement of lower extremities no hip pain.  No lumbar or thoracic spine tenderness.  Skin:    General: Skin is warm and dry.     Capillary Refill: Capillary refill takes less than 2 seconds.  Neurological:     General: No focal deficit present.     Mental Status: She is alert and oriented to person, place, and time.     Cranial Nerves: No cranial nerve  deficit.     Sensory: No sensory deficit.     Motor: No weakness.     ED Results / Procedures / Treatments   Labs (all labs ordered are listed, but only abnormal results are displayed) Labs Reviewed - No data to display  EKG None  Radiology CT Head Wo Contrast  Result Date: 01/24/2020 CLINICAL DATA:  Minor head trauma.  Fall with head laceration. EXAM: CT HEAD WITHOUT CONTRAST TECHNIQUE: Contiguous axial images were obtained from the base  of the skull through the vertex without intravenous contrast. COMPARISON:  07/14/2017 FINDINGS: Brain: No evidence of acute infarction, hemorrhage, hydrocephalus, extra-axial collection or mass lesion/mass effect. Confluent chronic small vessel ischemic low-density in the cerebral white matter. Generalized atrophy most notable in the medial temporal lobes. Vascular: Atherosclerotic calcification Skull: High and left scalp swelling without opaque foreign body or calvarial fracture. Sinuses/Orbits: No evidence of injury IMPRESSION: 1. No evidence of intracranial injury. 2. Scalp laceration without calvarial fracture. Electronically Signed   By: Monte Fantasia M.D.   On: 01/24/2020 11:14    Procedures Procedures (including critical care time)  Medications Ordered in ED Medications - No data to display  ED Course  I have reviewed the triage vital signs and the nursing notes.  Pertinent labs & imaging results that were available during my care of the patient were reviewed by me and considered in my medical decision making (see chart for details).    MDM Rules/Calculators/A&P                          CT of the head showed no acute bony abnormalities.  No intracranial injury.  After cleaning up the scalp is an area of an abrasion.  Measuring about 3 cm on top of the scalp.  No further bleeding.  Final Clinical Impression(s) / ED Diagnoses Final diagnoses:  Fall, initial encounter  Abrasion of scalp, initial encounter    Rx / DC Orders ED  Discharge Orders    None       Fredia Sorrow, MD 01/24/20 1253

## 2020-02-17 ENCOUNTER — Observation Stay (HOSPITAL_COMMUNITY)
Admission: EM | Admit: 2020-02-17 | Discharge: 2020-02-18 | Disposition: A | Discharge status: Home / Self Care | Payer: Medicare Other | Attending: Internal Medicine | Admitting: Internal Medicine

## 2020-02-17 ENCOUNTER — Encounter (HOSPITAL_COMMUNITY): Payer: Self-pay | Admitting: Emergency Medicine

## 2020-02-17 ENCOUNTER — Other Ambulatory Visit: Payer: Self-pay

## 2020-02-17 ENCOUNTER — Emergency Department (HOSPITAL_COMMUNITY): Payer: Medicare Other

## 2020-02-17 DIAGNOSIS — J439 Emphysema, unspecified: Secondary | ICD-10-CM | POA: Diagnosis not present

## 2020-02-17 DIAGNOSIS — S066X9A Traumatic subarachnoid hemorrhage with loss of consciousness of unspecified duration, initial encounter: Principal | ICD-10-CM | POA: Insufficient documentation

## 2020-02-17 DIAGNOSIS — W19XXXA Unspecified fall, initial encounter: Secondary | ICD-10-CM | POA: Insufficient documentation

## 2020-02-17 DIAGNOSIS — Z79899 Other long term (current) drug therapy: Secondary | ICD-10-CM | POA: Diagnosis not present

## 2020-02-17 DIAGNOSIS — S199XXA Unspecified injury of neck, initial encounter: Secondary | ICD-10-CM | POA: Diagnosis not present

## 2020-02-17 DIAGNOSIS — S0990XA Unspecified injury of head, initial encounter: Secondary | ICD-10-CM | POA: Diagnosis present

## 2020-02-17 DIAGNOSIS — Y939 Activity, unspecified: Secondary | ICD-10-CM | POA: Insufficient documentation

## 2020-02-17 DIAGNOSIS — E039 Hypothyroidism, unspecified: Secondary | ICD-10-CM | POA: Insufficient documentation

## 2020-02-17 DIAGNOSIS — R6889 Other general symptoms and signs: Secondary | ICD-10-CM | POA: Diagnosis not present

## 2020-02-17 DIAGNOSIS — Y999 Unspecified external cause status: Secondary | ICD-10-CM | POA: Diagnosis not present

## 2020-02-17 DIAGNOSIS — R2681 Unsteadiness on feet: Secondary | ICD-10-CM | POA: Insufficient documentation

## 2020-02-17 DIAGNOSIS — Z20822 Contact with and (suspected) exposure to covid-19: Secondary | ICD-10-CM | POA: Diagnosis not present

## 2020-02-17 DIAGNOSIS — S0003XA Contusion of scalp, initial encounter: Secondary | ICD-10-CM | POA: Diagnosis not present

## 2020-02-17 DIAGNOSIS — Z743 Need for continuous supervision: Secondary | ICD-10-CM | POA: Diagnosis not present

## 2020-02-17 DIAGNOSIS — R55 Syncope and collapse: Secondary | ICD-10-CM | POA: Diagnosis not present

## 2020-02-17 DIAGNOSIS — Z7982 Long term (current) use of aspirin: Secondary | ICD-10-CM | POA: Diagnosis not present

## 2020-02-17 DIAGNOSIS — M4802 Spinal stenosis, cervical region: Secondary | ICD-10-CM | POA: Diagnosis not present

## 2020-02-17 DIAGNOSIS — Y92480 Sidewalk as the place of occurrence of the external cause: Secondary | ICD-10-CM | POA: Diagnosis not present

## 2020-02-17 DIAGNOSIS — R58 Hemorrhage, not elsewhere classified: Secondary | ICD-10-CM | POA: Diagnosis not present

## 2020-02-17 DIAGNOSIS — M47812 Spondylosis without myelopathy or radiculopathy, cervical region: Secondary | ICD-10-CM | POA: Diagnosis not present

## 2020-02-17 DIAGNOSIS — R269 Unspecified abnormalities of gait and mobility: Secondary | ICD-10-CM | POA: Diagnosis present

## 2020-02-17 DIAGNOSIS — I951 Orthostatic hypotension: Secondary | ICD-10-CM | POA: Insufficient documentation

## 2020-02-17 DIAGNOSIS — E785 Hyperlipidemia, unspecified: Secondary | ICD-10-CM | POA: Diagnosis not present

## 2020-02-17 DIAGNOSIS — R404 Transient alteration of awareness: Secondary | ICD-10-CM | POA: Diagnosis not present

## 2020-02-17 DIAGNOSIS — J8410 Pulmonary fibrosis, unspecified: Secondary | ICD-10-CM | POA: Diagnosis not present

## 2020-02-17 DIAGNOSIS — S066X0A Traumatic subarachnoid hemorrhage without loss of consciousness, initial encounter: Secondary | ICD-10-CM | POA: Diagnosis not present

## 2020-02-17 DIAGNOSIS — I609 Nontraumatic subarachnoid hemorrhage, unspecified: Secondary | ICD-10-CM

## 2020-02-17 DIAGNOSIS — E041 Nontoxic single thyroid nodule: Secondary | ICD-10-CM | POA: Diagnosis not present

## 2020-02-17 LAB — BASIC METABOLIC PANEL
Anion gap: 9 (ref 5–15)
BUN: 11 mg/dL (ref 8–23)
CO2: 28 mmol/L (ref 22–32)
Calcium: 9.4 mg/dL (ref 8.9–10.3)
Chloride: 101 mmol/L (ref 98–111)
Creatinine, Ser: 0.77 mg/dL (ref 0.44–1.00)
GFR calc Af Amer: >60 mL/min (ref >60)
GFR calc non Af Amer: >60 mL/min (ref >60)
Glucose, Bld: 125 mg/dL — ABNORMAL HIGH (ref 70–99)
Potassium: 4.2 mmol/L (ref 3.5–5.1)
Sodium: 138 mmol/L (ref 135–145)

## 2020-02-17 LAB — TROPONIN I (HIGH SENSITIVITY)
Troponin I (High Sensitivity): 4 ng/L (ref <18)
Troponin I (High Sensitivity): 5 ng/L (ref <18)

## 2020-02-17 LAB — URINALYSIS, ROUTINE W REFLEX MICROSCOPIC
Bilirubin Urine: NEGATIVE
Glucose, UA: NEGATIVE mg/dL
Hgb urine dipstick: NEGATIVE
Ketones, ur: 5 mg/dL — AB
Leukocytes,Ua: NEGATIVE
Nitrite: NEGATIVE
Protein, ur: NEGATIVE mg/dL
Specific Gravity, Urine: 1.012 (ref 1.005–1.030)
pH: 7 (ref 5.0–8.0)

## 2020-02-17 LAB — CK: Total CK: 75 U/L (ref 38–234)

## 2020-02-17 LAB — CBC
HCT: 34.2 % — ABNORMAL LOW (ref 36.0–46.0)
Hemoglobin: 10.8 g/dL — ABNORMAL LOW (ref 12.0–15.0)
MCH: 31 pg (ref 26.0–34.0)
MCHC: 31.6 g/dL (ref 30.0–36.0)
MCV: 98.3 fL (ref 80.0–100.0)
Platelets: 190 10*3/uL (ref 150–400)
RBC: 3.48 MIL/uL — ABNORMAL LOW (ref 3.87–5.11)
RDW: 13 % (ref 11.5–15.5)
WBC: 8.2 10*3/uL (ref 4.0–10.5)
nRBC: 0 % (ref 0.0–0.2)

## 2020-02-17 LAB — CBG MONITORING, ED: Glucose-Capillary: 115 mg/dL — ABNORMAL HIGH (ref 70–99)

## 2020-02-17 LAB — SARS CORONAVIRUS 2 BY RT PCR (HOSPITAL ORDER, PERFORMED IN ~~LOC~~ HOSPITAL LAB): SARS Coronavirus 2: NEGATIVE

## 2020-02-17 MED ORDER — LACTATED RINGERS IV BOLUS
750.0000 mL | Freq: Once | INTRAVENOUS | Status: AC
  Administered 2020-02-17: 750 mL via INTRAVENOUS

## 2020-02-17 MED ORDER — SODIUM CHLORIDE 0.9 % IV BOLUS
1000.0000 mL | Freq: Once | INTRAVENOUS | Status: AC
  Administered 2020-02-17: 1000 mL via INTRAVENOUS

## 2020-02-17 MED ORDER — ACETAMINOPHEN 650 MG RE SUPP: 650.0000 mg | Freq: Four times a day (QID) | RECTAL | Status: DC | PRN

## 2020-02-17 MED ORDER — ADULT MULTIVITAMIN W/MINERALS CH
1.0000 | ORAL_TABLET | Freq: Every day | ORAL | Status: DC
  Administered 2020-02-18: 1 via ORAL
  Filled 2020-02-17: qty 1

## 2020-02-17 MED ORDER — ACETAMINOPHEN 325 MG PO TABS
650.0000 mg | ORAL_TABLET | Freq: Four times a day (QID) | ORAL | Status: DC | PRN
  Administered 2020-02-18: 650 mg via ORAL
  Filled 2020-02-17: qty 2

## 2020-02-17 MED ORDER — LACTATED RINGERS IV SOLN: INTRAVENOUS | Status: AC

## 2020-02-17 MED ORDER — LEVOTHYROXINE SODIUM 50 MCG PO TABS
50.0000 ug | ORAL_TABLET | Freq: Every day | ORAL | Status: DC
  Administered 2020-02-18: 50 ug via ORAL
  Filled 2020-02-17: qty 1

## 2020-02-17 MED ORDER — ATORVASTATIN CALCIUM 10 MG PO TABS
10.0000 mg | ORAL_TABLET | Freq: Every day | ORAL | Status: DC
  Administered 2020-02-18: 10 mg via ORAL
  Filled 2020-02-17: qty 1

## 2020-02-17 NOTE — ED Triage Notes (Addendum)
Pt to triage via [facility] from home.  Pt found lying on sidewalk.  Based on time pt normally gets home from church they suspect she was lying there for approx 1 hour.  Pt denies remember anything from today.  Doesn't remember going to church.  Hematoma and lac to R side of head.  Abrasions to R arm and bilateral knees.    C-collar in place by EMS.  Denies neck and back pain.  No blood thinners.

## 2020-02-17 NOTE — Progress Notes (Signed)
Patient ID: Tara Harris, female   DOB: Sep 26, 1931, 86 y.o.   MRN: [id number] BP (!) 162/80   Pulse 86   Temp 99.3 F (37.4 C) (Oral)   Resp 18   SpO2 99%  [name] by report has a normal neurological examination. The CT scan shows a miniscule area of hyperdensity in the left cerebellar hemisphere. If her exam does not change there is no need to repeat the CT. This is a non operative hemorrhage.  If there are questions feel free to contact me.

## 2020-02-17 NOTE — H&P (Addendum)
History and Physical    Tara Harris QIW:[id number] DOB: 05-26-1932 DOA: 02/17/2020  PCP: Tonia Ghent, MD  Patient coming from: home  Chief Complaint: syncope  HPI: Tara Harris is a 86 y.o. female with past medical history significant for hyperlipidemia, hyperglycemia who presents for evaluation of syncope.  Found lying on sidewalk by bystanders.  Patient was dressed in her church close.  If she had gone to church, daughter states patient when she was found would have been lying on the ground for approximately 1 hour.  Patient does not remember anything from today from waking up until EMS found her. Daughter in room does state that patient has been going under a lot of stress due to family member recently passing.  Denies additional aggravating or alleviating factors.     She confirms that she has not been eating well.  Previously she had lost a lot of weight and has regained some.  She states poor PO intake for fluids and she was probably dehydrated this morning.  To me, she states that she rememebrs getting up and eating breakfast but doesn't recall getting ready for church.  Currently she has a headache of 3/10 and denies any neurological deficits.  **She states that she was told she had a brain bleed about a month ago and went to [facility] but I do not see record of this.   Functionality, walks with a walker at baseline, fairly unsteady but functionally seems to be independent and recently her husband passed away.   Of note, undergoing a pulmonary fibrosis work up with Dr. Vaughan Browner.  has recently had a rheumatological work up and ANA was 1:40 Anti-PL-12 Ab (RDL Negative Moderate Positive Abnormal.  7 months ago had an elevated TSH at 6.36. "CT chest 12/14/2019-bibasilar predominant pulmonary fibrosis with traction bronchiectasis, bronchiolectasis with honeycombing noted.  No emphysema noted although it was reported on the radiology read.  Unable to assess clearly but it may be worse  compared to 2018"   Also, she states her thyroid medicine was increased about a month ago I think from 25 to 82mcg.  ED Course:  orthostatics were positive in the ED wbc 8.2, hgb 10.8, plt 190, SCr 0.77, K 4.2 CK 75 troponin 4 then 5, acs ruled out covid testing in progress CT head with small acute subarachnoid hemorrhage at cerebellum Ct cervical spine without acute findings Given some fluids in the ED.    Review of Systems: As per HPI otherwise 10 point review of systems negative.  Other pertinents as below:  General - denies any fevers or chills or recent illness HEENT - has a headache as above.  She has some lacerations Cardio - denies any CP, palpitations recently Resp - has had a chronic cough, denies any new sob GI - chronic ~3 bowel movements a day GU - she has been having some increased frequency for the past 1-2 months.  Dysuria.  MSK - denies new joint or back pain.  She has chronic back pain and has some chronic weakness of her left LE.  Skin - denies new skin changes,  Neuro - denies any new numbness or weakness Psych - has been grieving but denies any other mood disorders  Past Medical History:  Diagnosis Date   Arthritis    High cholesterol    IBS (irritable bowel syndrome)    Menopause    Thyroid disease    Tubular adenoma 01/13/2007    Past Surgical History:  Procedure Laterality  Date   biopsy left leg     CATARACT EXTRACTION, BILATERAL  2012   COLONOSCOPY     TONSILLECTOMY       reports that she has never smoked. She has never used smokeless tobacco. She reports that she does not drink alcohol and does not use drugs.  No Known Allergies  Family History  Problem Relation Age of Onset   Alzheimer's disease Mother    Heart attack Father    Alzheimer's disease Father    Heart attack Sister    Kidney disease Brother    Colon cancer Neg Hx    Stomach cancer Neg Hx    Breast cancer Neg Hx     Prior to Admission medications     Medication Sig Start Date End Date Taking? Authorizing Provider  acetaminophen (TYLENOL) 650 MG CR tablet Take 1,300 mg by mouth every 8 (eight) hours as needed for pain.    [provider]  aspirin 81 MG tablet Take 81 mg by mouth daily after breakfast.     [provider]  atorvastatin (LIPITOR) 10 MG tablet TAKE 1 TABLET DAILY AT 6 P.M. 12/31/19   Tonia Ghent, MD  Cholecalciferol (VITAMIN D) 50 MCG (2000 UT) CAPS Take 1 capsule (2,000 Units total) by mouth daily. 05/22/18   Reed, Tiffany L, DO  levothyroxine (SYNTHROID) 50 MCG tablet Take 50 mcg by mouth daily on empty stomach. 07/15/19   Tonia Ghent, MD  Multiple Vitamins-Minerals (MULTIVITAMIN WITH MINERALS) tablet Take 1 tablet by mouth daily after breakfast.     [provider]  polyethylene glycol (MIRALAX / GLYCOLAX) packet Take 17 g by mouth daily as needed for mild constipation or moderate constipation.     [provider]  tiZANidine (ZANAFLEX) 4 MG tablet Take 1 tablet (4 mg total) by mouth every 6 (six) hours as needed for muscle spasms (back pain). Caution of sedation 01/08/20   Abner Greenspan, MD    Physical Exam: Vitals:   02/17/20 1416 02/17/20 1725 02/17/20 1745 02/17/20 1900  BP: (!) 144/66 (!) 162/80 (!) 168/84 (!) 145/65  Pulse: 79 86 84 89  Resp: 18   20  Temp: 99.3 F (37.4 C)     TempSrc: Oral     SpO2: 98% 99% 100% 100%    Constitutional: NAD, comfortable Eyes: pupils equal and reactive to light, anicteric, without injection ENMT: MMM, throat without exudates or erythema Neck: normal, supple, no masses, no thyromegaly noted Respiratory: corase bibnasilar rales, nwob, no cough noted in the room  Cardiovascular: rrr w/o significant murmur Abdomen: NBS, NT,   Musculoskeletal: moving all 4 extremities, she states she is chronically 4+ dorsalflexion on the left but otherwise is 5/5 in UE and LE's Skin: no rashes, lesions, ulcers. No induration Neurologic: CN 2-12 grossly  intact. Sensation intact Psychiatric: AO appearing, mentation appropriate  Labs on Admission: I have personally reviewed following labs and imaging studies  CBC: Recent Labs  Lab 02/17/20 1443  WBC 8.2  HGB 10.8*  HCT 34.2*  MCV 98.3  PLT 253   Basic Metabolic Panel: Recent Labs  Lab 02/17/20 1443  NA 138  K 4.2  CL 101  CO2 28  GLUCOSE 125*  BUN 11  CREATININE 0.77  CALCIUM 9.4   GFR: CrCl cannot be calculated (Unknown ideal weight.). Liver Function Tests: No results for input(s): AST, ALT, ALKPHOS, BILITOT, PROT, ALBUMIN in the last 168 hours. No results for input(s): LIPASE, AMYLASE in the last 168  hours. No results for input(s): AMMONIA in the last 168 hours. Coagulation Profile: No results for input(s): INR, PROTIME in the last 168 hours. Cardiac Enzymes: Recent Labs  Lab 02/17/20 1443  CKTOTAL 75   BNP (last 3 results) No results for input(s): PROBNP in the last 8760 hours. HbA1C: No results for input(s): HGBA1C in the last 72 hours. CBG: Recent Labs  Lab 02/17/20 1442  GLUCAP 115*   Lipid Profile: No results for input(s): CHOL, HDL, LDLCALC, TRIG, CHOLHDL, LDLDIRECT in the last 72 hours. Thyroid Function Tests: No results for input(s): TSH, T4TOTAL, FREET4, T3FREE, THYROIDAB in the last 72 hours. Anemia Panel: No results for input(s): VITAMINB12, FOLATE, FERRITIN, TIBC, IRON, RETICCTPCT in the last 72 hours. Urine analysis:    Component Value Date/Time   COLORURINE YELLOW 02/17/2020 2010   APPEARANCEUR CLEAR 02/17/2020 2010   LABSPEC 1.012 02/17/2020 2010   PHURINE 7.0 02/17/2020 2010   GLUCOSEU NEGATIVE 02/17/2020 2010   HGBUR NEGATIVE 02/17/2020 2010   HGBUR negative 01/07/2010 0000   BILIRUBINUR NEGATIVE 02/17/2020 2010   BILIRUBINUR Neg 07/12/2019 1020   KETONESUR 5 (A) 02/17/2020 2010   PROTEINUR NEGATIVE 02/17/2020 2010   UROBILINOGEN 0.2 07/12/2019 1020   UROBILINOGEN 0.2 01/07/2010 0000   NITRITE NEGATIVE 02/17/2020 2010    LEUKOCYTESUR NEGATIVE 02/17/2020 2010    Radiological Exams on Admission: DG Chest 2 View  Result Date: 02/17/2020 CLINICAL DATA:  Syncope. EXAM: CHEST - 2 VIEW COMPARISON:  December 07, 2019 FINDINGS: Again noted are severe emphysematous changes with underlying pulmonary fibrosis. This pattern is similar to prior studies. There is no pneumothorax. No large pleural effusion. The heart size is stable. There is no acute osseous abnormality. There are old healed right-sided rib fractures. IMPRESSION: Severe emphysematous changes with underlying pulmonary fibrosis. No acute cardiopulmonary process. Electronically Signed   By: Constance Holster M.D.   On: 02/17/2020 18:25   DG Pelvis 1-2 Views  Result Date: 02/17/2020 CLINICAL DATA:  Syncope EXAM: PELVIS - 1-2 VIEW COMPARISON:  None. FINDINGS: There is no evidence of pelvic fracture or diastasis. No pelvic bone lesions are seen. IMPRESSION: Negative. Electronically Signed   By: Constance Holster M.D.   On: 02/17/2020 18:26   CT Head Wo Contrast  Result Date: 02/17/2020 CLINICAL DATA:  Found lying on sidewalk, patient does not remember what happened, hematoma and laceration to RIGHT side of head, presumed fall, head and neck trauma EXAM: CT HEAD WITHOUT CONTRAST CT CERVICAL SPINE WITHOUT CONTRAST TECHNIQUE: Multidetector CT imaging of the head and cervical spine was performed following the standard protocol without intravenous contrast. Multiplanar CT image reconstructions of the cervical spine were also generated. COMPARISON:  CT head 01/24/2020 Correlation: CT chest 12/14/2019 FINDINGS: CT HEAD FINDINGS Brain: Generalized atrophy. Normal ventricular morphology. No midline shift or mass effect. Small vessel chronic ischemic changes of deep cerebral white matter. Small focus of high attenuation acute subarachnoid hemorrhage at anterolateral aspect of LEFT cerebellar hemisphere. No additional intracranial hemorrhage, mass lesion, or evidence of acute infarction.  No additional extra-axial fluid. Vascular: No hyperdense vessels. Atherosclerotic calcification of internal carotid arteries at skull base Skull: Calvaria intact.  Large RIGHT frontotemporal scalp hematoma. Sinuses/Orbits: Clear Other: N/A CT CERVICAL SPINE FINDINGS Alignment: Minimal anterolisthesis at C3-C4 and retrolisthesis at C4-C5. Remaining alignments normal. Skull base and vertebrae: Osseous demineralization. Disc space narrowing and endplate spur formation C4-C5 through C6-C7. Multilevel facet degenerative changes throughout cervical spine bilaterally. Vertebral body heights maintained. Skull base intact. No fracture, additional subluxation, or bone  destruction. Soft tissues and spinal canal: Prevertebral soft tissues normal thickness. Atherosclerotic calcifications of the carotid bifurcations much greater on LEFT. Tiny RIGHT thyroid nodules; Not clinically significant; no follow-up imaging recommended (ref: J Am Coll Radiol. 2015 Feb;12(2): 143-50). Disc levels:  Minimally bulging discs C5-C6. Upper chest: Extensive biapical scarring and subpleural interstitial changes, stable. Other: N/A IMPRESSION: Atrophy with small vessel chronic ischemic changes of deep cerebral white matter. Small focus of acute subarachnoid hemorrhage at anterolateral aspect of LEFT cerebellar hemisphere. No acute intraparenchymal brain abnormalities. Multilevel degenerative disc and facet disease changes of the cervical spine. No acute cervical spine abnormalities. Critical Value/emergent results were called by telephone at the time of interpretation on 02/17/2020 at 1613 hours to provider [facility] , who verbally acknowledged these results. Electronically Signed   By: Lavonia Dana M.D.   On: 02/17/2020 16:14   CT Cervical Spine Wo Contrast  Result Date: 02/17/2020 CLINICAL DATA:  Found lying on sidewalk, patient does not remember what happened, hematoma and laceration to RIGHT side of head, presumed fall, head and neck trauma  EXAM: CT HEAD WITHOUT CONTRAST CT CERVICAL SPINE WITHOUT CONTRAST TECHNIQUE: Multidetector CT imaging of the head and cervical spine was performed following the standard protocol without intravenous contrast. Multiplanar CT image reconstructions of the cervical spine were also generated. COMPARISON:  CT head 01/24/2020 Correlation: CT chest 12/14/2019 FINDINGS: CT HEAD FINDINGS Brain: Generalized atrophy. Normal ventricular morphology. No midline shift or mass effect. Small vessel chronic ischemic changes of deep cerebral white matter. Small focus of high attenuation acute subarachnoid hemorrhage at anterolateral aspect of LEFT cerebellar hemisphere. No additional intracranial hemorrhage, mass lesion, or evidence of acute infarction. No additional extra-axial fluid. Vascular: No hyperdense vessels. Atherosclerotic calcification of internal carotid arteries at skull base Skull: Calvaria intact.  Large RIGHT frontotemporal scalp hematoma. Sinuses/Orbits: Clear Other: N/A CT CERVICAL SPINE FINDINGS Alignment: Minimal anterolisthesis at C3-C4 and retrolisthesis at C4-C5. Remaining alignments normal. Skull base and vertebrae: Osseous demineralization. Disc space narrowing and endplate spur formation C4-C5 through C6-C7. Multilevel facet degenerative changes throughout cervical spine bilaterally. Vertebral body heights maintained. Skull base intact. No fracture, additional subluxation, or bone destruction. Soft tissues and spinal canal: Prevertebral soft tissues normal thickness. Atherosclerotic calcifications of the carotid bifurcations much greater on LEFT. Tiny RIGHT thyroid nodules; Not clinically significant; no follow-up imaging recommended (ref: J Am Coll Radiol. 2015 Feb;12(2): 143-50). Disc levels:  Minimally bulging discs C5-C6. Upper chest: Extensive biapical scarring and subpleural interstitial changes, stable. Other: N/A IMPRESSION: Atrophy with small vessel chronic ischemic changes of deep cerebral white  matter. Small focus of acute subarachnoid hemorrhage at anterolateral aspect of LEFT cerebellar hemisphere. No acute intraparenchymal brain abnormalities. Multilevel degenerative disc and facet disease changes of the cervical spine. No acute cervical spine abnormalities. Critical Value/emergent results were called by telephone at the time of interpretation on 02/17/2020 at 1613 hours to provider [facility] , who verbally acknowledged these results. Electronically Signed   By: Lavonia Dana M.D.   On: 02/17/2020 16:14    EKG: Independently reviewed. As below  Assessment/Plan Principal Problem:   Syncope and collapse Active Problems:   Orthostatic hypotension   Hyperlipidemia   Hypothyroidism   Unsteady gait    Syncope suspect from orthostasis but since she can't remember the event admitted her for continued monitoring.  No specific EKG changes or significant murmurs on exam so I initially wasn't going to do TTE but given her history of pulmonary fibrosis and never had a TTE  before, I went ahead and ordered. Monitor on telemetry DDX: less likely Tizanidine causing hypoxia with her pulmonary fibrosis orthostatics positive in the ED EKG showed NSR, QTc 422, no signs of arrhythmia. telemetry checking other electrolytes s/p 1 L in the ED and will put on fluids, repeat orthostatics in the morning to consider more.  Possibly ?chronically orthostatic elevated TSH in the past and will repeat hold sedatives like tizanidine which was ortdered outpatient by PCP on 01/08/2020   small acute subarachnoid hemorrhage at cerebellum - talked with neurosurgery.  Dr. Christella Noa was talked to by ED provider "Small focus of acute subarachnoid hemorrhage at anterolateral aspect of LEFT cerebellar hemisphere." Neuro checks every 4 hours.  So small and doesn't need to repeat imaging per Neurosurgery hold baby aspirin **If patient's mental status changes can repage Neurosurgery holding DVT ppx, only SCD's gave  patient diet to encourage PO intake I asked patient to let us know right away with any change in her status, she completely agreed and said she would  Frequency over a few months but UA is negative, no need for abx  Grieving I offerred emotional support. Husband died recently which probably relates to her not eating well.  Unintentional weightloss "has lost a lot of weight", I encouraged good PO intake Possibly malnourished, will continue MVI and check phosphorus  awaiting final med reconciliation but will continue the below. HLD - cont statin equivalent atorvastatin 10mg  hypothyroidism - cont levothyroxine 46mcg, repeating tsh as above unsteadiness - uses a walker at home, PT/OT while here  Patient and/or Family completely agreed with the plan, expressed understanding and I answered all questions.  DVT prophylaxis: None:concern for [facility] in the brain Code Status: DNR after talking with the patient, she states this is part of her will, she states her family is aware of her wishes as well.   Family Communication: n/a Disposition Plan: ,likely home if PT/OT can keep patient ambulatling appropriately, avoiding falls.  Up in chair and ambulate with patient qshift Consults called: ED spoke with Dr. Christella Noa of Neurosurgery but declined to consult officially, unless patients mental status changes. Admission status:  Inpatient because she requires neurochecks and an inpatient work up for a syncopal episode.  A total of 73 minutes utilized during this admission.  [facility]   If 7PM-7AM, please contact night-coverage [email or web address] Password [facility]  02/17/2020, 10:37 PM

## 2020-02-17 NOTE — ED Provider Notes (Signed)
  Face-to-face evaluation   History: Patient was pronounced at home when she fell.  No witnesses were there.  She was found by her neighbor.  Unclear exactly what happened or how long she was down for.  She arrives by EMS, with cervical collar in place.  She has injury to the right parietal region.  Physical exam: Alert elderly female.  No dysarthria or aphasia.  She is able to move arms and legs normally.  She is able to sit up on the bed easily.  She is lucid.  Medical screening examination/treatment/procedure(s) were conducted as a shared visit with non-physician practitioner(s) and myself.  I personally evaluated the patient during the encounter    Daleen Bo, MD 02/17/20 2048

## 2020-02-17 NOTE — ED Notes (Signed)
Patient transported to X-ray 

## 2020-02-17 NOTE — ED Provider Notes (Signed)
[facility] EMERGENCY DEPARTMENT Provider Note   CSN: [id number] Arrival date & time: 02/17/20  1416     History Chief Complaint  Patient presents with  . Loss of Consciousness    Tara Harris is a 86 y.o. female with past medical history significant for hyperlipidemia, hyperglycemia who presents for evaluation of syncope.  Found lying on sidewalk by bystanders.  Patient was dressed in her church close.  If she had gone to church, daughter states patient when she was found would have been lying on the ground for approximately 1 hour.  Patient does not remember anything from today from waking up until EMS found her.  Her only complaint is some tenderness to her right forehead.  She has been ambulatory since the incident.  Does take a daily baby aspirin however no additional anticoagulation.  She denies preceding similar thunderclap headache, lightness, dizziness, chest pain or shortness of breath.  Patient denies any neck pain, back pain, facial pain, difficulty with word finding, slurred speech, paresthesias, weakness, abdominal pain, diarrhea, dysuria.  She was in her normal state of health prior to today.  Daughter in room does state that patient has been going under a lot of stress due to family member recently passing.  Denies additional aggravating or alleviating factors.  Tetanus 2020  History obtained from patient, daughter in room and past medical records.  No interpreter used.  HPI     Past Medical History:  Diagnosis Date  . Arthritis   . High cholesterol   . IBS (irritable bowel syndrome)   . Menopause   . Thyroid disease   . Tubular adenoma 01/13/2007    Patient Active Problem List   Diagnosis Date Noted  . Low back pain 01/08/2020  . Abnormal chest x-ray 12/14/2019  . Abnormal nipple 12/14/2019  . Neuropathy 08/03/2019  . Caregiver role strain 02/26/2019  . Advance care planning 02/26/2019  . Unsteady gait 05/22/2018  . Age-related  osteoporosis without current pathological fracture 05/22/2018  . Hyperglycemia 07/03/2015  . Nocturnal leg cramps 07/03/2015  . Hypothyroidism 02/20/2015  . Urinary incontinence 05/31/2011  . UNSPECIFIED VITAMIN D DEFICIENCY 01/07/2010  . ARTHRITIS, LEFT KNEE 01/07/2010  . IRRITABLE BOWEL SYNDROME 08/07/2009  . Hyperlipidemia 12/26/2007  . ALLERGIC RHINITIS, CHRONIC 12/26/2007  . ARTHRITIS 12/26/2007    Past Surgical History:  Procedure Laterality Date  . biopsy left leg    . CATARACT EXTRACTION, BILATERAL  2012  . COLONOSCOPY    . TONSILLECTOMY       OB History   No obstetric history on file.     Family History  Problem Relation Age of Onset  . Alzheimer's disease Mother   . Heart attack Father   . Alzheimer's disease Father   . Heart attack Sister   . Kidney disease Brother   . Colon cancer Neg Hx   . Stomach cancer Neg Hx   . Breast cancer Neg Hx     Social History   Tobacco Use  . Smoking status: Never Smoker  . Smokeless tobacco: Never Used  Vaping Use  . Vaping Use: Never used  Substance Use Topics  . Alcohol use: No  . Drug use: No    Home Medications Prior to Admission medications   Medication Sig Start Date End Date Taking? Authorizing Provider  acetaminophen (TYLENOL) 650 MG CR tablet Take 1,300 mg by mouth every 8 (eight) hours as needed for pain.    [provider]  aspirin 81 MG  tablet Take 81 mg by mouth daily after breakfast.     [provider]  atorvastatin (LIPITOR) 10 MG tablet TAKE 1 TABLET DAILY AT 6 P.M. 12/31/19   Tonia Ghent, MD  Cholecalciferol (VITAMIN D) 50 MCG (2000 UT) CAPS Take 1 capsule (2,000 Units total) by mouth daily. 05/22/18   Reed, Tiffany L, DO  levothyroxine (SYNTHROID) 50 MCG tablet Take 50 mcg by mouth daily on empty stomach. 07/15/19   Tonia Ghent, MD  Multiple Vitamins-Minerals (MULTIVITAMIN WITH MINERALS) tablet Take 1 tablet by mouth daily after breakfast.     [provider]    polyethylene glycol (MIRALAX / GLYCOLAX) packet Take 17 g by mouth daily as needed for mild constipation or moderate constipation.     [provider]  tiZANidine (ZANAFLEX) 4 MG tablet Take 1 tablet (4 mg total) by mouth every 6 (six) hours as needed for muscle spasms (back pain). Caution of sedation 01/08/20   Tower, Wynelle Fanny, MD    Allergies    Patient has no known allergies.  Review of Systems   Review of Systems  Constitutional: Negative.   HENT: Negative.   Respiratory: Negative.   Cardiovascular: Negative.   Gastrointestinal: Negative.   Genitourinary: Negative.   Musculoskeletal: Negative.   Skin: Positive for wound.  Neurological: Positive for syncope. Negative for dizziness, tremors, seizures, facial asymmetry, speech difficulty, weakness, light-headedness, numbness and headaches.  All other systems reviewed and are negative.   Physical Exam Updated Vital Signs BP (!) 168/84   Pulse 84   Temp 99.3 F (37.4 C) (Oral)   Resp 18   SpO2 100%   Physical Exam Vitals and nursing note reviewed. Exam conducted with a chaperone present.  Constitutional:      General: She is not in acute distress.    Appearance: She is well-developed. She is not toxic-appearing or diaphoretic.     Comments: Thin, elderly pleassant female  HENT:     Head: Normocephalic. Abrasion present. No raccoon eyes, Battle's sign, masses or laceration. Hair is normal.     Jaw: There is normal jaw occlusion.      Comments: Abrasion to right forehead, No lacerations to suture. Mild hematoma noted.    Right Ear: No hemotympanum.     Left Ear: No hemotympanum.     Mouth/Throat:     Lips: Pink.     Mouth: Mucous membranes are moist. No injury, lacerations or oral lesions.     Pharynx: Oropharynx is clear. Uvula midline.     Comments: Dentition intact.  Tongue midline.  No evidence of intraoral trauma Eyes:     Extraocular Movements: Extraocular movements intact.     Conjunctiva/sclera:  Conjunctivae normal.     Pupils: Pupils are equal, round, and reactive to light.     Visual Fields: Right eye visual fields normal and left eye visual fields normal.     Comments: PERRLA, EOMs intact.  No nystagmus  Neck:     Trachea: Trachea and phonation normal.     Comments: Patient in c-collar.  No midline cervical tenderness Cardiovascular:     Rate and Rhythm: Normal rate.     Pulses: Normal pulses.          Radial pulses are 2+ on the right side and 2+ on the left side.       Dorsalis pedis pulses are 2+ on the right side and 2+ on the left side.       Posterior tibial pulses  are 2+ on the right side and 2+ on the left side.     Heart sounds: Normal heart sounds.  Pulmonary:     Effort: Pulmonary effort is normal. No respiratory distress.     Breath sounds: Normal breath sounds and air entry.     Comments: Speaks in full sentences without difficulty.  Lungs clear to auscultation bilaterally. Chest:     Comments: Equal rise and fall to chest wall.  No crepitus, no bony tenderness or gross traumatic injury. Abdominal:     General: There is no distension.     Palpations: Abdomen is soft.     Tenderness: There is no abdominal tenderness. There is no right CVA tenderness, left CVA tenderness, guarding or rebound.     Comments: Soft, nontender.  Musculoskeletal:        General: Normal range of motion.     Comments: Moves all 4 extremities without difficulty.  No bony tenderness.  Contusion to right olecranon however is able to fully extend and flex without difficulty.  No underlying tenderness.  Abrasions to bilateral knees.  Able to flex and extend, able to straight leg raise without difficulty.  Negative varus, valgus stress.  Wiggles toes that difficulty.  Pelvis stable, nontender palpation.  No shortening or rotation of legs.  No midline thoracic or lumbar tenderness.  Skin:    General: Skin is warm and dry.     Capillary Refill: Capillary refill takes less than 2 seconds.      Findings: Abrasion, bruising and ecchymosis present.     Comments: Scattered contusions, abrasions and hematomas.  No lacerations to suture.  Punctate abrasions to scalp that do not require suturing  Neurological:     Mental Status: She is alert.     Cranial Nerves: Cranial nerves are intact.     Sensory: Sensation is intact.     Motor: Motor function is intact.     Comments: Mental Status:  Alert, oriented, thought content appropriate. Speech fluent without evidence of aphasia. Able to follow 2 step commands without difficulty.  Cranial Nerves:  II:  Peripheral visual fields grossly normal, pupils equal, round, reactive to light III,IV, VI: ptosis not present, extra-ocular motions intact bilaterally  V,VII: smile symmetric, facial light touch sensation equal VIII: hearing grossly normal bilaterally  IX,X: midline uvula rise  XI: bilateral shoulder shrug equal and strong XII: midline tongue extension  Motor:  5/5 in upper and lower extremities bilaterally including strong and equal grip strength and dorsiflexion/plantar flexion Sensory: Pinprick and light touch normal in all extremities.  Deep Tendon Reflexes: 2+ and symmetric  Cerebellar: normal finger-to-nose with bilateral upper extremities Gait: normal gait and balance CV: distal pulses palpable throughout      ED Results / Procedures / Treatments   Labs (all labs ordered are listed, but only abnormal results are displayed) Labs Reviewed  BASIC METABOLIC PANEL - Abnormal; Notable for the following components:      Result Value   Glucose, Bld 125 (*)    All other components within normal limits  CBC - Abnormal; Notable for the following components:   RBC 3.48 (*)    Hemoglobin 10.8 (*)    HCT 34.2 (*)    All other components within normal limits  CBG MONITORING, ED - Abnormal; Notable for the following components:   Glucose-Capillary 115 (*)    All other components within normal limits  SARS CORONAVIRUS 2 BY RT PCR  (HOSPITAL ORDER, [facility])  CK  URINALYSIS, ROUTINE W REFLEX MICROSCOPIC  TROPONIN I (HIGH SENSITIVITY)  TROPONIN I (HIGH SENSITIVITY)    EKG EKG Interpretation  Date/Time:  Sunday February 17 2020 18:29:01 EDT Ventricular Rate:  82 PR Interval:    QRS Duration: 89 QT Interval:  361 QTC Calculation: 422 R Axis:   72 Text Interpretation: Sinus rhythm Low voltage, precordial leads since last tracing no significant change Confirmed by Daleen Bo [phone]) on 02/17/2020 6:32:18 PM   Radiology DG Chest 2 View  Result Date: 02/17/2020 CLINICAL DATA:  Syncope. EXAM: CHEST - 2 VIEW COMPARISON:  December 07, 2019 FINDINGS: Again noted are severe emphysematous changes with underlying pulmonary fibrosis. This pattern is similar to prior studies. There is no pneumothorax. No large pleural effusion. The heart size is stable. There is no acute osseous abnormality. There are old healed right-sided rib fractures. IMPRESSION: Severe emphysematous changes with underlying pulmonary fibrosis. No acute cardiopulmonary process. Electronically Signed   By: Constance Holster M.D.   On: 02/17/2020 18:25   DG Pelvis 1-2 Views  Result Date: 02/17/2020 CLINICAL DATA:  Syncope EXAM: PELVIS - 1-2 VIEW COMPARISON:  None. FINDINGS: There is no evidence of pelvic fracture or diastasis. No pelvic bone lesions are seen. IMPRESSION: Negative. Electronically Signed   By: Constance Holster M.D.   On: 02/17/2020 18:26   CT Head Wo Contrast  Result Date: 02/17/2020 CLINICAL DATA:  Found lying on sidewalk, patient does not remember what happened, hematoma and laceration to RIGHT side of head, presumed fall, head and neck trauma EXAM: CT HEAD WITHOUT CONTRAST CT CERVICAL SPINE WITHOUT CONTRAST TECHNIQUE: Multidetector CT imaging of the head and cervical spine was performed following the standard protocol without intravenous contrast. Multiplanar CT image reconstructions of the cervical spine were  also generated. COMPARISON:  CT head 01/24/2020 Correlation: CT chest 12/14/2019 FINDINGS: CT HEAD FINDINGS Brain: Generalized atrophy. Normal ventricular morphology. No midline shift or mass effect. Small vessel chronic ischemic changes of deep cerebral white matter. Small focus of high attenuation acute subarachnoid hemorrhage at anterolateral aspect of LEFT cerebellar hemisphere. No additional intracranial hemorrhage, mass lesion, or evidence of acute infarction. No additional extra-axial fluid. Vascular: No hyperdense vessels. Atherosclerotic calcification of internal carotid arteries at skull base Skull: Calvaria intact.  Large RIGHT frontotemporal scalp hematoma. Sinuses/Orbits: Clear Other: N/A CT CERVICAL SPINE FINDINGS Alignment: Minimal anterolisthesis at C3-C4 and retrolisthesis at C4-C5. Remaining alignments normal. Skull base and vertebrae: Osseous demineralization. Disc space narrowing and endplate spur formation C4-C5 through C6-C7. Multilevel facet degenerative changes throughout cervical spine bilaterally. Vertebral body heights maintained. Skull base intact. No fracture, additional subluxation, or bone destruction. Soft tissues and spinal canal: Prevertebral soft tissues normal thickness. Atherosclerotic calcifications of the carotid bifurcations much greater on LEFT. Tiny RIGHT thyroid nodules; Not clinically significant; no follow-up imaging recommended (ref: J Am Coll Radiol. 2015 Feb;12(2): 143-50). Disc levels:  Minimally bulging discs C5-C6. Upper chest: Extensive biapical scarring and subpleural interstitial changes, stable. Other: N/A IMPRESSION: Atrophy with small vessel chronic ischemic changes of deep cerebral white matter. Small focus of acute subarachnoid hemorrhage at anterolateral aspect of LEFT cerebellar hemisphere. No acute intraparenchymal brain abnormalities. Multilevel degenerative disc and facet disease changes of the cervical spine. No acute cervical spine abnormalities.  Critical Value/emergent results were called by telephone at the time of interpretation on 02/17/2020 at 1613 hours to provider [facility] , who verbally acknowledged these results. Electronically Signed   By: Lavonia Dana M.D.   On: 02/17/2020 16:14  CT Cervical Spine Wo Contrast  Result Date: 02/17/2020 CLINICAL DATA:  Found lying on sidewalk, patient does not remember what happened, hematoma and laceration to RIGHT side of head, presumed fall, head and neck trauma EXAM: CT HEAD WITHOUT CONTRAST CT CERVICAL SPINE WITHOUT CONTRAST TECHNIQUE: Multidetector CT imaging of the head and cervical spine was performed following the standard protocol without intravenous contrast. Multiplanar CT image reconstructions of the cervical spine were also generated. COMPARISON:  CT head 01/24/2020 Correlation: CT chest 12/14/2019 FINDINGS: CT HEAD FINDINGS Brain: Generalized atrophy. Normal ventricular morphology. No midline shift or mass effect. Small vessel chronic ischemic changes of deep cerebral white matter. Small focus of high attenuation acute subarachnoid hemorrhage at anterolateral aspect of LEFT cerebellar hemisphere. No additional intracranial hemorrhage, mass lesion, or evidence of acute infarction. No additional extra-axial fluid. Vascular: No hyperdense vessels. Atherosclerotic calcification of internal carotid arteries at skull base Skull: Calvaria intact.  Large RIGHT frontotemporal scalp hematoma. Sinuses/Orbits: Clear Other: N/A CT CERVICAL SPINE FINDINGS Alignment: Minimal anterolisthesis at C3-C4 and retrolisthesis at C4-C5. Remaining alignments normal. Skull base and vertebrae: Osseous demineralization. Disc space narrowing and endplate spur formation C4-C5 through C6-C7. Multilevel facet degenerative changes throughout cervical spine bilaterally. Vertebral body heights maintained. Skull base intact. No fracture, additional subluxation, or bone destruction. Soft tissues and spinal canal: Prevertebral soft  tissues normal thickness. Atherosclerotic calcifications of the carotid bifurcations much greater on LEFT. Tiny RIGHT thyroid nodules; Not clinically significant; no follow-up imaging recommended (ref: J Am Coll Radiol. 2015 Feb;12(2): 143-50). Disc levels:  Minimally bulging discs C5-C6. Upper chest: Extensive biapical scarring and subpleural interstitial changes, stable. Other: N/A IMPRESSION: Atrophy with small vessel chronic ischemic changes of deep cerebral white matter. Small focus of acute subarachnoid hemorrhage at anterolateral aspect of LEFT cerebellar hemisphere. No acute intraparenchymal brain abnormalities. Multilevel degenerative disc and facet disease changes of the cervical spine. No acute cervical spine abnormalities. Critical Value/emergent results were called by telephone at the time of interpretation on 02/17/2020 at 1613 hours to provider [facility] , who verbally acknowledged these results. Electronically Signed   By: Lavonia Dana M.D.   On: 02/17/2020 16:14    Procedures Irrigation  Date/Time: 02/17/2020 6:58 PM Performed by: Nettie Elm, PA-C Authorized by: Nettie Elm, PA-C  Preparation: Patient was prepped and draped in the usual sterile fashion. Local anesthesia used: no  Anesthesia: Local anesthesia used: no  Sedation: Patient sedated: no  Patient tolerance: patient tolerated the procedure well with no immediate complications  .Critical Care Performed by: Nettie Elm, PA-C Authorized by: Nettie Elm, PA-C   Critical care provider statement:    Critical care time (minutes):  45   Critical care was necessary to treat or prevent imminent or life-threatening deterioration of the following conditions:  CNS failure or compromise (Subarachnoid hemorrhage)   Critical care was time spent personally by me on the following activities:  Discussions with consultants, evaluation of patient's response to treatment, examination of patient, ordering and  performing treatments and interventions, ordering and review of laboratory studies, ordering and review of radiographic studies, pulse oximetry, re-evaluation of patient's condition, obtaining history from patient or surrogate and review of old charts   (including critical care time)  Medications Ordered in ED Medications  sodium chloride 0.9 % bolus 1,000 mL (1,000 mLs Intravenous New Bag/Given 02/17/20 1757)    ED Course  I have reviewed the triage vital signs and the nursing notes.  Pertinent labs & imaging results that were available  during my care of the patient were reviewed by me and considered in my medical decision making (see chart for details).  86 year old presents for evaluation of syncope. Found down on ground for unknown about of time. Patient does not recall any events of today leading up into fall. No hx of syncope.  Labs and imaging started from triage which I personally read interpreted  CBC without leukocytosis, hemoglobin 09.9 Metabolic panel with mild hyperglycemia to 125 however no additional electrolyte, renal abnormality CK 75 Troponin 4>>> 5 COVID pending CT head with small acute subarachnoid hemorrhage at cerebellum Ct cervical spine without acute findings DG chest emphysema.  No cardiomegaly, Cardiomegally, pneumothorax. Old rib fractures. No overlying tenderness to palpation on exam. Low suspicion for acute rib fracture DG pelvis without acute abnormality EKG without STEMI. Low voltage, no evidence of HF on exam  Orthostatic VS positive.>>> Getting IVF  Patient's scalp thoroughly irrigated with normal saline and peroxide.  No lacerations to suture however does have punctate areas to right forehead with underlying hematoma which intermittently ooze.  These do not need to be sutured.  Patient was able to ambulate wheelchair to bed without difficulty.  Discussed admission for syncope work-up and further observation with patient and family in room.  They are  agreeable to this.  CONSULT with Dr. Christella Noa Neurosurgery. Per MD if normal neuro exam does not need further workup. Can replaced consult if Neuro status changes. Does not need re-imaging per NSGY  CONSULT with Dr. Francesco Sor with [facility] who agrees to evaluate patient for admission.  Patient seen evaluated attending, Dr. Eulis Foster who agrees above treatment, plan disposition  The patient appears reasonably stabilized for admission considering the current resources, flow, and capabilities available in the ED at this time, and I doubt any other [facility] requiring further screening and/or treatment in the ED prior to admission.    MDM Rules/Calculators/A&P                           Final Clinical Impression(s) / ED Diagnoses Final diagnoses:  Syncope and collapse  Subarachnoid hemorrhage (HCC)  Orthostatic hypotension  Contusion of scalp, initial encounter    Rx / DC Orders ED Discharge Orders    None       Little Bashore A, PA-C 02/17/20 1900    Daleen Bo, MD 02/17/20 2048

## 2020-02-18 ENCOUNTER — Inpatient Hospital Stay (HOSPITAL_BASED_OUTPATIENT_CLINIC_OR_DEPARTMENT_OTHER): Payer: Medicare Other

## 2020-02-18 DIAGNOSIS — R55 Syncope and collapse: Secondary | ICD-10-CM

## 2020-02-18 DIAGNOSIS — S066X9A Traumatic subarachnoid hemorrhage with loss of consciousness of unspecified duration, initial encounter: Secondary | ICD-10-CM | POA: Diagnosis not present

## 2020-02-18 DIAGNOSIS — I351 Nonrheumatic aortic (valve) insufficiency: Secondary | ICD-10-CM | POA: Diagnosis not present

## 2020-02-18 DIAGNOSIS — I340 Nonrheumatic mitral (valve) insufficiency: Secondary | ICD-10-CM | POA: Diagnosis not present

## 2020-02-18 DIAGNOSIS — I361 Nonrheumatic tricuspid (valve) insufficiency: Secondary | ICD-10-CM | POA: Diagnosis not present

## 2020-02-18 LAB — CBC
HCT: 31.4 % — ABNORMAL LOW (ref 36.0–46.0)
Hemoglobin: 10 g/dL — ABNORMAL LOW (ref 12.0–15.0)
MCH: 31.3 pg (ref 26.0–34.0)
MCHC: 31.8 g/dL (ref 30.0–36.0)
MCV: 98.1 fL (ref 80.0–100.0)
Platelets: 197 10*3/uL (ref 150–400)
RBC: 3.2 MIL/uL — ABNORMAL LOW (ref 3.87–5.11)
RDW: 12.8 % (ref 11.5–15.5)
WBC: 7.1 10*3/uL (ref 4.0–10.5)
nRBC: 0 % (ref 0.0–0.2)

## 2020-02-18 LAB — PHOSPHORUS: Phosphorus: 3.6 mg/dL (ref 2.5–4.6)

## 2020-02-18 LAB — BASIC METABOLIC PANEL
Anion gap: 9 (ref 5–15)
BUN: 9 mg/dL (ref 8–23)
CO2: 27 mmol/L (ref 22–32)
Calcium: 9.1 mg/dL (ref 8.9–10.3)
Chloride: 100 mmol/L (ref 98–111)
Creatinine, Ser: 0.76 mg/dL (ref 0.44–1.00)
GFR calc Af Amer: >60 mL/min (ref >60)
GFR calc non Af Amer: >60 mL/min (ref >60)
Glucose, Bld: 108 mg/dL — ABNORMAL HIGH (ref 70–99)
Potassium: 3.6 mmol/L (ref 3.5–5.1)
Sodium: 136 mmol/L (ref 135–145)

## 2020-02-18 LAB — ECHOCARDIOGRAM COMPLETE
Area-P 1/2: 4.06 cm2
S' Lateral: 1.45 cm

## 2020-02-18 LAB — TSH: TSH: 0.953 u[IU]/mL (ref 0.350–4.500)

## 2020-02-18 LAB — MAGNESIUM: Magnesium: 1.7 mg/dL (ref 1.7–2.4)

## 2020-02-18 MED ORDER — BLOOD GLUCOSE MONITOR KIT: PACK | 0 refills | Status: DC

## 2020-02-18 NOTE — Care Management Obs Status (Signed)
[facility] NOTIFICATION   Patient Details  Name: Tara Harris MRN: [id number] Date of Birth: 08/04/1931   Medicare Observation Status Notification Given:  Yes    Fuller Mandril, RN 02/18/2020, 2:27 PM

## 2020-02-18 NOTE — Care Management CC44 (Signed)
Condition Code 44 Documentation Completed  Patient Details  Name: Tara Harris MRN: [id number] Date of Birth: 1931/09/19   Condition Code 44 given:  Yes Patient signature on Condition Code 44 notice:  Yes Documentation of 2 MD's agreement:  Yes Code 44 added to claim:  Yes    Fuller Mandril, RN 02/18/2020, 2:27 PM

## 2020-02-18 NOTE — Progress Notes (Signed)
°  Echocardiogram 2D Echocardiogram has been performed.  Tara Harris 02/18/2020, 10:55 AM

## 2020-02-18 NOTE — Discharge Summary (Signed)
Physician Discharge Summary  Tara Harris YSA:[id number] DOB: 04/29/1932 DOA: 02/17/2020  PCP: Tonia Ghent, MD  Admit date: 02/17/2020 Discharge date: 02/18/2020  Admitted From: Home Disposition: Home  Recommendations for Outpatient Follow-up:  1. Follow up with PCP in 3 to 5 days as discussed for repeat blood pressure, labs and to go over glucose readings  Home Health: None Equipment/Devices: None  Discharge Condition: Stable CODE STATUS: DNR Diet recommendation: Regular diet, advance as tolerated  Brief/Interim Summary: Tara Harris is a 86 y.o. female with past medical history significant for hyperlipidemia, hyperglycemia who presents for evaluation of syncope. Found lying on sidewalk by bystanders. Patient was dressed in her church close. If she had gone to church, daughter states patient when she was found would have been lying on the ground for approximately 1 hour. Patient does not remember anything from today from waking up until EMS found her. Daughter in room does state that patient has been going under a lot of stress due to family member recently passing. Denies additional aggravating or alleviating factors. She confirms that she has not been eating well. Previously she had lost a lot of weight and has regained some. She states poor PO intake for fluids and she was probably dehydrated this morning. To me, she states that she rememebrs getting up and eating breakfast but doesn't recall getting ready for church. Currently she has a headache of 3/10 and denies any neurological deficits. **She states that she was told she had a brain bleed about a month ago and went to [facility] but I do not see record of this. Functionality, walks with a walker at baseline, fairly unsteady but functionally seems to be independent and recently her husband passed away. Of note, undergoing a pulmonary fibrosis work up with Dr. Vaughan Browner. has recently had a rheumatological work up and ANA  was 1:40 Anti-PL-12 Ab (RDL Negative Moderate Positive Abnormal. 7 months ago had an elevated TSH at 6.36. "CT chest 12/14/2019-bibasilar predominant pulmonary fibrosis with traction bronchiectasis, bronchiolectasis with honeycombing noted. No emphysema noted although it was reported on the radiology read. Unable to assess clearly but it may be worse compared to 2018" Also, she states her thyroid medicine was increased about a month ago I think from 25 to 35mcg.  Patient met as above with episode of syncope, unclear prodrome however patient admits to extremely poor p.o. intake and dehydration as above.  Patient's orthostatics were markedly positive in the ED, after fluid resuscitation patient states she feels "back to normal" and is otherwise requesting discharge home, repeat orthostatics now within normal limits, patient has no other complaints or concerns.  Telemetry, echocardiogram and ambulation have otherwise been unremarkable.  Patient indicates her PCP has noted concern for hypoglycemia with patient's episodes of dizziness in the past, will discharge with glucometer and have patient check her glucose when she has these events to help further delineate whether this is a hypoglycemic event or truly hypotensive/orthostatic in nature.   Discharge Diagnoses:  Principal Problem:   Syncope and collapse Active Problems:   Hyperlipidemia   Hypothyroidism   Unsteady gait   Orthostatic hypotension    Discharge Instructions  Discharge Instructions    Call MD for:  difficulty breathing, headache or visual disturbances   Complete by: As directed    Call MD for:  extreme fatigue   Complete by: As directed    Call MD for:  persistant dizziness or light-headedness   Complete by: As directed    Diet -  low sodium heart healthy   Complete by: As directed    Increase activity slowly   Complete by: As directed      Allergies as of 02/18/2020   No Known Allergies     Medication List    TAKE  these medications   acetaminophen 650 MG CR tablet Commonly known as: TYLENOL Take 1,300 mg by mouth every 8 (eight) hours as needed for pain.   aspirin 81 MG tablet Take 81 mg by mouth daily after breakfast.   atorvastatin 10 MG tablet Commonly known as: LIPITOR TAKE 1 TABLET DAILY AT 6 P.M. What changed:   how much to take  how to take this  when to take this   blood glucose meter kit and supplies Kit Dispense based on patient and insurance preference. Use up to four times daily as directed. (FOR ICD-9 250.00, 250.01).   levothyroxine 50 MCG tablet Commonly known as: SYNTHROID Take 50 mcg by mouth daily on empty stomach. What changed:   how much to take  how to take this  when to take this   multivitamin with minerals tablet Take 1 tablet by mouth daily after breakfast.   polyethylene glycol 17 g packet Commonly known as: MIRALAX / GLYCOLAX Take 17 g by mouth daily as needed for mild constipation or moderate constipation.   tiZANidine 4 MG tablet Commonly known as: Zanaflex Take 1 tablet (4 mg total) by mouth every 6 (six) hours as needed for muscle spasms (back pain). Caution of sedation   Vitamin D 50 MCG (2000 UT) Caps Take 1 capsule (2,000 Units total) by mouth daily.       No Known Allergies  Consultations:  None  Procedures/Studies: DG Chest 2 View  Result Date: 02/17/2020 CLINICAL DATA:  Syncope. EXAM: CHEST - 2 VIEW COMPARISON:  December 07, 2019 FINDINGS: Again noted are severe emphysematous changes with underlying pulmonary fibrosis. This pattern is similar to prior studies. There is no pneumothorax. No large pleural effusion. The heart size is stable. There is no acute osseous abnormality. There are old healed right-sided rib fractures. IMPRESSION: Severe emphysematous changes with underlying pulmonary fibrosis. No acute cardiopulmonary process. Electronically Signed   By: Katherine Mantle M.D.   On: 02/17/2020 18:25   DG Pelvis 1-2  Views  Result Date: 02/17/2020 CLINICAL DATA:  Syncope EXAM: PELVIS - 1-2 VIEW COMPARISON:  None. FINDINGS: There is no evidence of pelvic fracture or diastasis. No pelvic bone lesions are seen. IMPRESSION: Negative. Electronically Signed   By: Katherine Mantle M.D.   On: 02/17/2020 18:26   CT Head Wo Contrast  Result Date: 02/17/2020 CLINICAL DATA:  Found lying on sidewalk, patient does not remember what happened, hematoma and laceration to RIGHT side of head, presumed fall, head and neck trauma EXAM: CT HEAD WITHOUT CONTRAST CT CERVICAL SPINE WITHOUT CONTRAST TECHNIQUE: Multidetector CT imaging of the head and cervical spine was performed following the standard protocol without intravenous contrast. Multiplanar CT image reconstructions of the cervical spine were also generated. COMPARISON:  CT head 01/24/2020 Correlation: CT chest 12/14/2019 FINDINGS: CT HEAD FINDINGS Brain: Generalized atrophy. Normal ventricular morphology. No midline shift or mass effect. Small vessel chronic ischemic changes of deep cerebral white matter. Small focus of high attenuation acute subarachnoid hemorrhage at anterolateral aspect of LEFT cerebellar hemisphere. No additional intracranial hemorrhage, mass lesion, or evidence of acute infarction. No additional extra-axial fluid. Vascular: No hyperdense vessels. Atherosclerotic calcification of internal carotid arteries at skull base Skull: Calvaria intact.  Large  RIGHT frontotemporal scalp hematoma. Sinuses/Orbits: Clear Other: N/A CT CERVICAL SPINE FINDINGS Alignment: Minimal anterolisthesis at C3-C4 and retrolisthesis at C4-C5. Remaining alignments normal. Skull base and vertebrae: Osseous demineralization. Disc space narrowing and endplate spur formation C4-C5 through C6-C7. Multilevel facet degenerative changes throughout cervical spine bilaterally. Vertebral body heights maintained. Skull base intact. No fracture, additional subluxation, or bone destruction. Soft tissues  and spinal canal: Prevertebral soft tissues normal thickness. Atherosclerotic calcifications of the carotid bifurcations much greater on LEFT. Tiny RIGHT thyroid nodules; Not clinically significant; no follow-up imaging recommended (ref: J Am Coll Radiol. 2015 Feb;12(2): 143-50). Disc levels:  Minimally bulging discs C5-C6. Upper chest: Extensive biapical scarring and subpleural interstitial changes, stable. Other: N/A IMPRESSION: Atrophy with small vessel chronic ischemic changes of deep cerebral white matter. Small focus of acute subarachnoid hemorrhage at anterolateral aspect of LEFT cerebellar hemisphere. No acute intraparenchymal brain abnormalities. Multilevel degenerative disc and facet disease changes of the cervical spine. No acute cervical spine abnormalities. Critical Value/emergent results were called by telephone at the time of interpretation on 02/17/2020 at 1613 hours to provider [facility] , who verbally acknowledged these results. Electronically Signed   By: Ulyses Southward M.D.   On: 02/17/2020 16:14   CT Head Wo Contrast  Result Date: 01/24/2020 CLINICAL DATA:  Minor head trauma.  Fall with head laceration. EXAM: CT HEAD WITHOUT CONTRAST TECHNIQUE: Contiguous axial images were obtained from the base of the skull through the vertex without intravenous contrast. COMPARISON:  07/14/2017 FINDINGS: Brain: No evidence of acute infarction, hemorrhage, hydrocephalus, extra-axial collection or mass lesion/mass effect. Confluent chronic small vessel ischemic low-density in the cerebral white matter. Generalized atrophy most notable in the medial temporal lobes. Vascular: Atherosclerotic calcification Skull: High and left scalp swelling without opaque foreign body or calvarial fracture. Sinuses/Orbits: No evidence of injury IMPRESSION: 1. No evidence of intracranial injury. 2. Scalp laceration without calvarial fracture. Electronically Signed   By: Marnee Spring M.D.   On: 01/24/2020 11:14   CT Cervical  Spine Wo Contrast  Result Date: 02/17/2020 CLINICAL DATA:  Found lying on sidewalk, patient does not remember what happened, hematoma and laceration to RIGHT side of head, presumed fall, head and neck trauma EXAM: CT HEAD WITHOUT CONTRAST CT CERVICAL SPINE WITHOUT CONTRAST TECHNIQUE: Multidetector CT imaging of the head and cervical spine was performed following the standard protocol without intravenous contrast. Multiplanar CT image reconstructions of the cervical spine were also generated. COMPARISON:  CT head 01/24/2020 Correlation: CT chest 12/14/2019 FINDINGS: CT HEAD FINDINGS Brain: Generalized atrophy. Normal ventricular morphology. No midline shift or mass effect. Small vessel chronic ischemic changes of deep cerebral white matter. Small focus of high attenuation acute subarachnoid hemorrhage at anterolateral aspect of LEFT cerebellar hemisphere. No additional intracranial hemorrhage, mass lesion, or evidence of acute infarction. No additional extra-axial fluid. Vascular: No hyperdense vessels. Atherosclerotic calcification of internal carotid arteries at skull base Skull: Calvaria intact.  Large RIGHT frontotemporal scalp hematoma. Sinuses/Orbits: Clear Other: N/A CT CERVICAL SPINE FINDINGS Alignment: Minimal anterolisthesis at C3-C4 and retrolisthesis at C4-C5. Remaining alignments normal. Skull base and vertebrae: Osseous demineralization. Disc space narrowing and endplate spur formation C4-C5 through C6-C7. Multilevel facet degenerative changes throughout cervical spine bilaterally. Vertebral body heights maintained. Skull base intact. No fracture, additional subluxation, or bone destruction. Soft tissues and spinal canal: Prevertebral soft tissues normal thickness. Atherosclerotic calcifications of the carotid bifurcations much greater on LEFT. Tiny RIGHT thyroid nodules; Not clinically significant; no follow-up imaging recommended (ref: J Am Coll Radiol. 2015  Feb;12(2): 143-50). Disc levels:   Minimally bulging discs C5-C6. Upper chest: Extensive biapical scarring and subpleural interstitial changes, stable. Other: N/A IMPRESSION: Atrophy with small vessel chronic ischemic changes of deep cerebral white matter. Small focus of acute subarachnoid hemorrhage at anterolateral aspect of LEFT cerebellar hemisphere. No acute intraparenchymal brain abnormalities. Multilevel degenerative disc and facet disease changes of the cervical spine. No acute cervical spine abnormalities. Critical Value/emergent results were called by telephone at the time of interpretation on 02/17/2020 at 1613 hours to provider [facility] , who verbally acknowledged these results. Electronically Signed   By: Lavonia Dana M.D.   On: 02/17/2020 16:14   ECHOCARDIOGRAM COMPLETE  Result Date: 02/18/2020    ECHOCARDIOGRAM REPORT   Patient Name:   Tara Harris Date of Exam: 02/18/2020 Medical Rec #:  [id number]       Height:       66.0 in Accession #:    [id number]      Weight:       111.0 lb Date of Birth:  [birth year]       BSA:          1.557 m Patient Age:    86 years        BP:           104/61 mmHg Patient Gender: F               HR:           83 bpm. Exam Location:  Inpatient Procedure: 2D Echo Indications:    syncope 780.2  History:        Patient has no prior history of Echocardiogram examinations.                 Risk Factors:Dyslipidemia.  Sonographer:    Jannett Celestine RDCS (AE) Referring Phys: [id number] Sueanne Margarita  Sonographer Comments: Technically difficult study. Suboptimal subcostal window. IMPRESSIONS  1. Left ventricular ejection fraction, by estimation, is 65 to 70%. The left ventricle has normal function. The left ventricle has no regional wall motion abnormalities. Left ventricular diastolic parameters are consistent with Grade I diastolic dysfunction (impaired relaxation).  2. Right ventricular systolic function is normal. The right ventricular size is normal.  3. The mitral valve is abnormal. Mild mitral valve  regurgitation.  4. The aortic valve is abnormal. Aortic valve regurgitation is mild. Mild aortic valve sclerosis is present, with no evidence of aortic valve stenosis. FINDINGS  Left Ventricle: Left ventricular ejection fraction, by estimation, is 65 to 70%. The left ventricle has normal function. The left ventricle has no regional wall motion abnormalities. The left ventricular internal cavity size was normal in size. There is  no left ventricular hypertrophy. Left ventricular diastolic parameters are consistent with Grade I diastolic dysfunction (impaired relaxation). Indeterminate filling pressures. Right Ventricle: The right ventricular size is normal. No increase in right ventricular wall thickness. Right ventricular systolic function is normal. Left Atrium: Left atrial size was normal in size. Right Atrium: Right atrial size was normal in size. Pericardium: There is no evidence of pericardial effusion. Mitral Valve: The mitral valve is abnormal. Mild to moderate mitral annular calcification. Mild mitral valve regurgitation. Tricuspid Valve: The tricuspid valve is grossly normal. Tricuspid valve regurgitation is trivial. Aortic Valve: The aortic valve is abnormal. Aortic valve regurgitation is mild. Mild aortic valve sclerosis is present, with no evidence of aortic valve stenosis. Pulmonic Valve: The pulmonic valve was not well visualized. Pulmonic valve regurgitation is not visualized. Aorta: The  aortic root and ascending aorta are structurally normal, with no evidence of dilitation. Venous: The inferior vena cava was not well visualized. IAS/Shunts: The interatrial septum was not well visualized.  LEFT VENTRICLE PLAX 2D LVIDd:         3.80 cm  Diastology LVIDs:         1.45 cm  LV e' medial:    6.74 cm/s LV PW:         0.70 cm  LV E/e' medial:  18.2 LV IVS:        0.80 cm  LV e' lateral:   4.73 cm/s LVOT diam:     1.80 cm  LV E/e' lateral: 26.0 LV SV:         61 LV SV Index:   39 LVOT Area:     2.54 cm   RIGHT VENTRICLE RV S prime:     13.50 cm/s TAPSE (M-mode): 1.8 cm LEFT ATRIUM             Index LA diam:        2.80 cm 1.80 cm/m LA Vol (A2C):   23.9 ml 15.35 ml/m LA Vol (A4C):   26.6 ml 17.12 ml/m LA Biplane Vol: 19.1 ml 12.27 ml/m  AORTIC VALVE LVOT Vmax:   121.00 cm/s LVOT Vmean:  73.400 cm/s LVOT VTI:    0.239 m  AORTA Ao Root diam: 2.50 cm MITRAL VALVE MV Area (PHT): 4.06 cm     SHUNTS MV Decel Time: 187 msec     Systemic VTI:  0.24 m MV E velocity: 123.00 cm/s  Systemic Diam: 1.80 cm MV A velocity: 113.00 cm/s MV E/A ratio:  1.09 Zoila Shutter MD Electronically signed by Zoila Shutter MD Signature Date/Time: 02/18/2020/12:28:20 PM    Final      Subjective: No acute issues or events overnight into this morning, patient feels like her symptoms have resolved, no longer having orthostatic hypotension or symptoms with ambulation.  Otherwise denies nausea, vomiting, diarrhea, constipation, headache, fevers, chills, chest pain, shortness of breath.   Discharge Exam: Vitals:   02/18/20 0700 02/18/20 0800  BP: (!) 105/57 104/61  Pulse: 71 83  Resp: (!) 23 17  Temp:    SpO2: 96% 99%   Vitals:   02/18/20 0500 02/18/20 0600 02/18/20 0700 02/18/20 0800  BP: (!) 106/53 111/88 (!) 105/57 104/61  Pulse: 78 75 71 83  Resp: (!) 23 (!) 25 (!) 23 17  Temp:      TempSrc:      SpO2: 99% 100% 96% 99%    General: Pt is alert, awake, not in acute distress Cardiovascular: RRR, S1/S2 +, no rubs, no gallops Respiratory: CTA bilaterally, no wheezing, no rhonchi Abdominal: Soft, NT, ND, bowel sounds + Extremities: no edema, no cyanosis    The results of significant diagnostics from this hospitalization (including imaging, microbiology, ancillary and laboratory) are listed below for reference.     Microbiology: Recent Results (from the past 240 hour(s))  SARS Coronavirus 2 by RT PCR (hospital order, performed in [facility] hospital lab) Nasopharyngeal Nasopharyngeal Swab     Status: None    Collection Time: 02/17/20  5:17 PM   Specimen: Nasopharyngeal Swab  Result Value Ref Range Status   SARS Coronavirus 2 NEGATIVE NEGATIVE Final    Comment: (NOTE) SARS-CoV-2 target nucleic acids are NOT DETECTED.  The SARS-CoV-2 RNA is generally detectable in upper and lower respiratory specimens during the acute phase of infection. The lowest concentration of SARS-CoV-2 viral copies  this assay can detect is 250 copies / mL. A negative result does not preclude SARS-CoV-2 infection and should not be used as the sole basis for treatment or other patient management decisions.  A negative result may occur with improper specimen collection / handling, submission of specimen other than nasopharyngeal swab, presence of viral mutation(s) within the areas targeted by this assay, and inadequate number of viral copies (<250 copies / mL). A negative result must be combined with clinical observations, patient history, and epidemiological information.  Fact Sheet for Patients:   StrictlyIdeas.no  Fact Sheet for Healthcare Providers: BankingDealers.co.za  This test is not yet approved or  cleared by the [place] FDA and has been authorized for detection and/or diagnosis of SARS-CoV-2 by FDA under an Emergency Use Authorization (EUA).  This EUA will remain in effect (meaning this test can be used) for the duration of the COVID-19 declaration under Section 564(b)(1) of the Act, 21 U.S.C. section 360bbb-3(b)(1), unless the authorization is terminated or revoked sooner.  Performed at [facility], [place] [address] [place], [place] [address]      Labs: BNP (last 3 results) Recent Labs    12/14/19 1114  BNP 73.4   Basic Metabolic Panel: Recent Labs  Lab 02/17/20 1443 02/17/20 2313 02/18/20 0320  NA 138  --  136  K 4.2  --  3.6  CL 101  --  100  CO2 28  --  27  GLUCOSE 125*  --  108*  BUN 11  --  9  CREATININE 0.77  --  0.76   CALCIUM 9.4  --  9.1  MG  --  1.7  --   PHOS  --  3.6  --    Liver Function Tests: No results for input(s): AST, ALT, ALKPHOS, BILITOT, PROT, ALBUMIN in the last 168 hours. No results for input(s): LIPASE, AMYLASE in the last 168 hours. No results for input(s): AMMONIA in the last 168 hours. CBC: Recent Labs  Lab 02/17/20 1443 02/18/20 0320  WBC 8.2 7.1  HGB 10.8* 10.0*  HCT 34.2* 31.4*  MCV 98.3 98.1  PLT 190 197   Cardiac Enzymes: Recent Labs  Lab 02/17/20 1443  CKTOTAL 75   BNP: Invalid input(s): POCBNP CBG: Recent Labs  Lab 02/17/20 1442  GLUCAP 115*   D-Dimer No results for input(s): DDIMER in the last 72 hours. Hgb A1c No results for input(s): HGBA1C in the last 72 hours. Lipid Profile No results for input(s): CHOL, HDL, LDLCALC, TRIG, CHOLHDL, LDLDIRECT in the last 72 hours. Thyroid function studies Recent Labs    02/17/20 2313  TSH 0.953   Anemia work up No results for input(s): VITAMINB12, FOLATE, FERRITIN, TIBC, IRON, RETICCTPCT in the last 72 hours. Urinalysis    Component Value Date/Time   COLORURINE YELLOW 02/17/2020 2010   APPEARANCEUR CLEAR 02/17/2020 2010   LABSPEC 1.012 02/17/2020 2010   PHURINE 7.0 02/17/2020 2010   GLUCOSEU NEGATIVE 02/17/2020 2010   HGBUR NEGATIVE 02/17/2020 2010   HGBUR negative 01/07/2010 0000   BILIRUBINUR NEGATIVE 02/17/2020 2010   BILIRUBINUR Neg 07/12/2019 1020   KETONESUR 5 (A) 02/17/2020 2010   PROTEINUR NEGATIVE 02/17/2020 2010   UROBILINOGEN 0.2 07/12/2019 1020   UROBILINOGEN 0.2 01/07/2010 0000   NITRITE NEGATIVE 02/17/2020 2010   LEUKOCYTESUR NEGATIVE 02/17/2020 2010   Sepsis Labs Invalid input(s): PROCALCITONIN,  WBC,  LACTICIDVEN Microbiology Recent Results (from the past 240 hour(s))  SARS Coronavirus 2 by RT PCR (hospital order, performed in [facility] hospital lab) Nasopharyngeal Nasopharyngeal Swab     Status: None   Collection Time: 02/17/20  5:17 PM   Specimen: Nasopharyngeal Swab   Result Value Ref Range Status   SARS Coronavirus 2 NEGATIVE NEGATIVE Final    Comment: (NOTE) SARS-CoV-2 target nucleic acids are NOT DETECTED.  The SARS-CoV-2 RNA is generally detectable in upper and lower respiratory specimens during the acute phase of infection. The lowest concentration of SARS-CoV-2 viral copies this assay can detect is 250 copies / mL. A negative result does not preclude SARS-CoV-2 infection and should not be used as the sole basis for treatment or other patient management decisions.  A negative result may occur with improper specimen collection / handling, submission of specimen other than nasopharyngeal swab, presence of viral mutation(s) within the areas targeted by this assay, and inadequate number of viral copies (<250 copies / mL). A negative result must be combined with clinical observations, patient history, and epidemiological information.  Fact Sheet for Patients:   StrictlyIdeas.no  Fact Sheet for Healthcare Providers: BankingDealers.co.za  This test is not yet approved or  cleared by the [place] FDA and has been authorized for detection and/or diagnosis of SARS-CoV-2 by FDA under an Emergency Use Authorization (EUA).  This EUA will remain in effect (meaning this test can be used) for the duration of the COVID-19 declaration under Section 564(b)(1) of the Act, 21 U.S.C. section 360bbb-3(b)(1), unless the authorization is terminated or revoked sooner.  Performed at [facility], [place] [address] [place], [place] [address]      Time coordinating discharge: Over 30 minutes  SIGNED:   Little Ishikawa, DO [facility] 02/18/2020, 12:47 PM Pager   If 7PM-7AM, please contact night-coverage [email or web address]

## 2020-02-21 ENCOUNTER — Ambulatory Visit (INDEPENDENT_AMBULATORY_CARE_PROVIDER_SITE_OTHER): Payer: Medicare Other | Admitting: Family Medicine

## 2020-02-21 ENCOUNTER — Other Ambulatory Visit: Payer: Self-pay

## 2020-02-21 ENCOUNTER — Encounter: Payer: Self-pay | Admitting: Family Medicine

## 2020-02-21 VITALS — BP 92/48 | HR 80 | Temp 95.4°F | Ht 66.0 in | Wt 111.2 lb

## 2020-02-21 DIAGNOSIS — R2681 Unsteadiness on feet: Secondary | ICD-10-CM

## 2020-02-21 DIAGNOSIS — D649 Anemia, unspecified: Secondary | ICD-10-CM

## 2020-02-21 LAB — BASIC METABOLIC PANEL
BUN: 15 mg/dL (ref 6–23)
CO2: 27 mEq/L (ref 19–32)
Calcium: 9.1 mg/dL (ref 8.4–10.5)
Chloride: 96 mEq/L (ref 96–112)
Creatinine, Ser: 0.82 mg/dL (ref 0.40–1.20)
GFR: 66.01 mL/min (ref >60.00)
Glucose, Bld: 87 mg/dL (ref 70–99)
Potassium: 4.2 mEq/L (ref 3.5–5.1)
Sodium: 128 mEq/L — ABNORMAL LOW (ref 135–145)

## 2020-02-21 LAB — CBC WITH DIFFERENTIAL/PLATELET
Basophils Absolute: 0 10*3/uL (ref 0.0–0.1)
Basophils Relative: 0.5 % (ref 0.0–3.0)
Eosinophils Absolute: 0.1 10*3/uL (ref 0.0–0.7)
Eosinophils Relative: 0.7 % (ref 0.0–5.0)
HCT: 29.2 % — ABNORMAL LOW (ref 36.0–46.0)
Hemoglobin: 10.2 g/dL — ABNORMAL LOW (ref 12.0–15.0)
Lymphocytes Relative: 12.1 % (ref 12.0–46.0)
Lymphs Abs: 1 10*3/uL (ref 0.7–4.0)
MCHC: 35 g/dL (ref 30.0–36.0)
MCV: 94.2 fl (ref 78.0–100.0)
Monocytes Absolute: 1 10*3/uL (ref 0.1–1.0)
Monocytes Relative: 12.6 % — ABNORMAL HIGH (ref 3.0–12.0)
Neutro Abs: 5.9 10*3/uL (ref 1.4–7.7)
Neutrophils Relative %: 74.1 % (ref 43.0–77.0)
Platelets: 215 10*3/uL (ref 150.0–400.0)
RBC: 3.1 Mil/uL — ABNORMAL LOW (ref 3.87–5.11)
RDW: 13.2 % (ref 11.5–15.5)
WBC: 8 10*3/uL (ref 4.0–10.5)

## 2020-02-21 MED ORDER — SODIUM CHLORIDE 1 G PO TABS: 1.0000 g | ORAL_TABLET | Freq: Two times a day (BID) | ORAL | 2 refills | Status: DC

## 2020-02-21 NOTE — Patient Instructions (Addendum)
Check with your insurance and see which meter is covered and update [place].   Add on 3 big glasses of water a day.  Add on salt tabs twice a day.  Check on getting a fall button.  Don't drive for now.   Go to the lab on the way out.   If you have mychart we'll likely use that to update you.     Let me know how you feel with the extra salt.  Take care.  Glad to see you.

## 2020-02-21 NOTE — Progress Notes (Signed)
This visit occurred during the SARS-CoV-2 public health emergency.  Safety protocols were in place, including screening questions prior to the visit, additional usage of staff PPE, and extensive cleaning of exam room while observing appropriate contact time as indicated for disinfecting solutions.  Her husband recently died, d/w pt.  Condolences offered.    Inpatient f/u.  She doesn't recall the event but was found down at home, a stranger saw her can started calling for help.  This was likely after she had gone to church.  CXR with severe emphysematous changes with underlying pulmonary fibrosis. No acute cardiopulmonary process.  She has a chronic cough over the last year with prev pulmonary eval.    Recent Echo discussed with patient.  1. Left ventricular ejection fraction, by estimation, is 65 to 70%. The  left ventricle has normal function. The left ventricle has no regional  wall motion abnormalities. Left ventricular diastolic parameters are  consistent with Grade I diastolic dysfunction (impaired relaxation).  2. Right ventricular systolic function is normal. The right ventricular  size is normal.  3. The mitral valve is abnormal. Mild mitral valve regurgitation.  4. The aortic valve is abnormal. Aortic valve regurgitation is mild. Mild  aortic valve sclerosis is present, with no evidence of aortic valve  stenosis.   This was the second fall in the last few weeks.  She is trying to take in more fluids.  Cautions d/w pt.  We talked about adding extra salt.  We talked about home safety and options.  She is living at home.  Daughter is staying with patient some of the time and is getting some extra help at home.    No FCNAVD.  No new focal neuro sx.  She has some blurry vision since the fall w/o escalating sx.  Advised not to drive.    She is going to check on getting a glucometer.  I asked her to check with her insurance and see what is covered.  We can send that in if she lets [place]  know.  Meds, vitals, and allergies reviewed.   ROS: Per HPI unless specifically indicated in ROS section   nad ncat except for R>L scalp hematoma.   EOMI PERRL Neck supple, no LA rrr ctab  Abdomen soft.  Not tender.  Extremities without edema.   Not orthostatic on standing.

## 2020-02-24 ENCOUNTER — Other Ambulatory Visit: Payer: Self-pay | Admitting: Family Medicine

## 2020-02-24 DIAGNOSIS — D649 Anemia, unspecified: Secondary | ICD-10-CM

## 2020-02-24 MED ORDER — FERROUS SULFATE 325 (65 FE) MG PO TABS: 325.0000 mg | ORAL_TABLET | Freq: Every day | ORAL | Status: DC

## 2020-02-24 NOTE — Assessment & Plan Note (Addendum)
With fall at home that could be related to relative orthostasis with lower blood pressure noted.  Discussed options.  She wants to keep living at home. Add on 3 big glasses of water a day.  Add on salt tabs twice a day.  Advised to check on getting a fall button.  Advised not to drive for now.   See notes on labs.  I want her to let me know how she feels when she is added on extra fluid and salt tablets.  At this point still okay for outpatient follow-up with extra help per family at home.  At least 30 minutes were devoted to patient care in this encounter (this can potentially include time spent reviewing the patient's file/history, interviewing and examining the patient, counseling/reviewing plan with patient, ordering referrals, ordering tests, reviewing relevant laboratory or x-ray data, and documenting the encounter).

## 2020-02-24 NOTE — Assessment & Plan Note (Signed)
See notes on labs.  Ifob pending.

## 2020-02-29 ENCOUNTER — Ambulatory Visit: Payer: Medicare Other | Admitting: Pulmonary Disease

## 2020-02-29 ENCOUNTER — Other Ambulatory Visit (INDEPENDENT_AMBULATORY_CARE_PROVIDER_SITE_OTHER): Payer: Medicare Other

## 2020-02-29 DIAGNOSIS — D649 Anemia, unspecified: Secondary | ICD-10-CM | POA: Diagnosis not present

## 2020-02-29 LAB — FECAL OCCULT BLOOD, IMMUNOCHEMICAL: Fecal Occult Bld: NEGATIVE

## 2020-03-19 ENCOUNTER — Other Ambulatory Visit (INDEPENDENT_AMBULATORY_CARE_PROVIDER_SITE_OTHER): Payer: Medicare Other

## 2020-03-19 ENCOUNTER — Other Ambulatory Visit: Payer: Self-pay

## 2020-03-19 ENCOUNTER — Other Ambulatory Visit: Payer: Self-pay | Admitting: Family Medicine

## 2020-03-19 DIAGNOSIS — D649 Anemia, unspecified: Secondary | ICD-10-CM | POA: Diagnosis not present

## 2020-03-19 LAB — CBC WITH DIFFERENTIAL/PLATELET
Basophils Absolute: 0.1 10*3/uL (ref 0.0–0.1)
Basophils Relative: 1.4 % (ref 0.0–3.0)
Eosinophils Absolute: 0.2 10*3/uL (ref 0.0–0.7)
Eosinophils Relative: 4.1 % (ref 0.0–5.0)
HCT: 37.4 % (ref 36.0–46.0)
Hemoglobin: 12.3 g/dL (ref 12.0–15.0)
Lymphocytes Relative: 24.7 % (ref 12.0–46.0)
Lymphs Abs: 1.5 10*3/uL (ref 0.7–4.0)
MCHC: 33 g/dL (ref 30.0–36.0)
MCV: 97.6 fl (ref 78.0–100.0)
Monocytes Absolute: 0.6 10*3/uL (ref 0.1–1.0)
Monocytes Relative: 9.9 % (ref 3.0–12.0)
Neutro Abs: 3.6 10*3/uL (ref 1.4–7.7)
Neutrophils Relative %: 59.9 % (ref 43.0–77.0)
Platelets: 256 10*3/uL (ref 150.0–400.0)
RBC: 3.83 Mil/uL — ABNORMAL LOW (ref 3.87–5.11)
RDW: 14.3 % (ref 11.5–15.5)
WBC: 6 10*3/uL (ref 4.0–10.5)

## 2020-03-19 LAB — IRON: Iron: 99 ug/dL (ref 42–145)

## 2020-03-19 LAB — BASIC METABOLIC PANEL
BUN: 15 mg/dL (ref 6–23)
CO2: 29 mEq/L (ref 19–32)
Calcium: 9.9 mg/dL (ref 8.4–10.5)
Chloride: 100 mEq/L (ref 96–112)
Creatinine, Ser: 0.86 mg/dL (ref 0.40–1.20)
GFR: 60.89 mL/min (ref >60.00)
Glucose, Bld: 118 mg/dL — ABNORMAL HIGH (ref 70–99)
Potassium: 4.6 mEq/L (ref 3.5–5.1)
Sodium: 137 mEq/L (ref 135–145)

## 2020-03-25 ENCOUNTER — Other Ambulatory Visit: Payer: Self-pay

## 2020-03-25 ENCOUNTER — Encounter: Payer: Self-pay | Admitting: Pulmonary Disease

## 2020-03-25 ENCOUNTER — Ambulatory Visit: Payer: Medicare Other | Admitting: Pulmonary Disease

## 2020-03-25 VITALS — BP 122/66 | HR 83 | Temp 97.3°F | Ht 66.0 in | Wt 111.6 lb

## 2020-03-25 DIAGNOSIS — N649 Disorder of breast, unspecified: Secondary | ICD-10-CM

## 2020-03-25 DIAGNOSIS — J849 Interstitial pulmonary disease, unspecified: Secondary | ICD-10-CM | POA: Diagnosis not present

## 2020-03-25 NOTE — Progress Notes (Signed)
Tara Harris    [id number]    [birth year]  Primary Care Physician:Duncan, Elveria Rising, MD  Referring Physician: Tonia Ghent, MD [address] [place],  [place] [address]  Chief complaint: Follow-up for pulmonary fibrosis  HPI: 86 year old with history of allergies, chronic cough for the past 2 to 3 years Referred for abnormal chest imaging showing pulmonary fibrosis Denies any dyspnea  Patient states that she does not have any dyspnea and feels she does not need any evaluation  Pets: Cats.  She had a dog in the past.  No birds Occupation: Retired Surveyor, minerals Exposures: No known exposures.  No mold, hot tub, Jacuzzi.  No down pillows or comforter Smoking history: Never smoker Travel history: No significant travel history Relevant family history: No significant family history of lung disease  Interim history: Case discussed at multidisciplinary conference on 01/15/2020 and presentation was felt to be consistent with either IPAF or IPF  Continues to feel well with no issues.  States that her breathing is doing well and does not want any further work-up or treatment She was recently evaluated with a mammogram for left nipple retraction with suspicious findings of possible Paget's disease  She has been referred to Dr. Georgette Dover, [place] surgery in August but unfortunately could not keep the appointment as her husband passed away  Outpatient Encounter Medications as of 03/25/2020  Medication Sig  . acetaminophen (TYLENOL) 650 MG CR tablet Take 1,300 mg by mouth every 8 (eight) hours as needed for pain.  Marland Kitchen aspirin 81 MG tablet Take 81 mg by mouth daily after breakfast.   . atorvastatin (LIPITOR) 10 MG tablet TAKE 1 TABLET DAILY AT 6 P.M.  . Cholecalciferol (VITAMIN D) 50 MCG (2000 UT) CAPS Take 1 capsule (2,000 Units total) by mouth daily.  Marland Kitchen levothyroxine (SYNTHROID) 50 MCG tablet Take 50 mcg by mouth daily on empty stomach.  . Multiple  Vitamins-Minerals (MULTIVITAMIN WITH MINERALS) tablet Take 1 tablet by mouth daily after breakfast.   . polyethylene glycol (MIRALAX / GLYCOLAX) packet Take 17 g by mouth daily as needed for mild constipation or moderate constipation.   . sodium chloride 1 g tablet Take 1 tablet (1 g total) by mouth 2 (two) times daily with a meal.  . [DISCONTINUED] blood glucose meter kit and supplies KIT Dispense based on patient and insurance preference. Use up to four times daily as directed. (FOR ICD-9 250.00, 250.01).   No facility-administered encounter medications on file as of 03/25/2020.   Physical Exam: Blood pressure 122/66, pulse 83, temperature (!) 97.3 F (36.3 C), temperature source Skin, height $RemoveBefo'5\' 6"'RvwocBvPyBw$  (1.676 m), weight 111 lb 9.6 oz (50.6 kg), SpO2 97 %. Gen:      No acute distress HEENT:  EOMI, sclera anicteric Neck:     No masses; no thyromegaly Lungs:    Clear to auscultation bilaterally; normal respiratory effort CV:         Regular rate and rhythm; no murmurs Abd:      + bowel sounds; soft, non-tender; no palpable masses, no distension Ext:    No edema; adequate peripheral perfusion Skin:      Warm and dry; no rash Neuro: alert and oriented x 3 Psych: normal mood and affect  Data Reviewed: Imaging: CT chest 12/14/2019-pulmonary fibrosis, no significant gradient with honeycombing.  Clearly progressed since 2017 Probable UIP pattern.  I have reviewed the images personally  Mammogram 12/26/2019-  Suspicious changes  to the left nipple including small flesh-colored mass and retraction. Possibility of Paget's disease should be considered.  Density within the retroareolar left breast likely represents dense fibroglandular tissue.  PFTs:  Labs:  Assessment:  Pulmonary fibrosis Chest probable UIP pattern with progression since 2017 This is likely IPAF versus IPF Discussed at multidisciplinary conference earlier this year There is read of emphysema on the CT but I do not see  it She does not have any exposures or connective tissue disease symptoms  Discussed findings with patient. She does not want any invasive testing or treatment with antifibrotics or any other therapies.  She feels her breathing is doing well Per her wishes we will not pursue further work-up or treatment. Follow-up in 6 months  Abnormal breast imaging She did not keep her appointment with surgery for evaluation of abnormal mammogram We will make referral again to Dr. Georgette Dover for evaluation.  Plan/Recommendations: Surgery referral Follow-up in 6 months  Marshell Garfinkel MD [facility] 03/25/2020, 9:53 AM  CC: Tonia Ghent, MD

## 2020-03-25 NOTE — Patient Instructions (Signed)
We will refer you again to Dr. Verita Lamb, [place] surgery to reevaluate abnormal breast findings Per your wishes we will not pursue work-up of your interstitial lung disease  Follow-up in 6 months.

## 2020-04-25 ENCOUNTER — Other Ambulatory Visit: Payer: Self-pay | Admitting: Family Medicine

## 2020-04-25 DIAGNOSIS — E034 Atrophy of thyroid (acquired): Secondary | ICD-10-CM

## 2020-04-25 NOTE — Telephone Encounter (Signed)
Pharmacy requests refill on: Synthroid 50 mcg  LAST REFILL: 01/05/2020 LAST OV: 02/21/2020 NEXT OV: Not Scheduled  PHARMACY: [id number] [place], [place]   Last TSH (02/17/2020): 0.953

## 2020-04-28 ENCOUNTER — Ambulatory Visit: Payer: Self-pay | Admitting: Surgery

## 2020-04-28 DIAGNOSIS — N6453 Retraction of nipple: Secondary | ICD-10-CM | POA: Diagnosis not present

## 2020-04-28 NOTE — H&P (Signed)
History of Present Illness Tara Harris. Tara Altemose MD; 04/28/2020 3:11 PM) The patient is a 86 year old female who presents with a complaint of nipple discharge. Referred by Dr. Elsie Harris for left nipple discharge/ retraction  This is an 86 year old female who lives independently who presents with recent onset of left nipple retraction and occasional discharge. In July 2021, she underwent diagnostic mammogram and ultrasound. Physical examination at that time revealed retraction of left nipple with a small flesh-colored soft tissue mass involving the nipple. Ultrasound was unremarkable. Mammogram showed dense tissue in the retroareolar left breast. Patient reports no further discharge. Her constipation was delayed due to other medical problems. She was hospitalized after multiple falls. One of these resulted in a subdural hematoma. She currently is neurologically intact. She presents now to discuss her nipple retraction.  DATA: Patient presents with new left nipple retraction and abnormality of the left nipple. EXAM: DIGITAL DIAGNOSTIC BILATERAL MAMMOGRAM WITH CAD AND TOMO ULTRASOUND LEFT BREAST COMPARISON: Previous exam(s). ACR Breast Density Category c: The breast tissue is heterogeneously dense, which may obscure small masses. FINDINGS: Interval increased density within the breast bilaterally most compatible with interval weight loss. No concerning masses, calcifications or nonsurgical distortion identified within the right or left breast. Dense tissue is demonstrated within the retroareolar left breast. Mammographic images were processed with CAD. On physical exam, the left nipple is retracted. There is a small flesh-colored soft tissue mass involving the nipple, best demonstrated with palpation. Targeted ultrasound is performed, showing no discrete retroareolar mass within the left breast. Dense tissue is visualized, corresponding with the density on mammography. IMPRESSION:  Suspicious changes to the left nipple including small flesh-colored mass and retraction. Possibility of Paget's disease should be considered. Density within the retroareolar left breast likely represents dense fibroglandular tissue. RECOMMENDATION: Surgical consultation for biopsy of the abnormality of the left nipple to exclude Paget's disease. Bilateral breast MRI for further evaluation given nipple changes as well as retroareolar density. I have discussed the findings and recommendations with the patient. If applicable, a reminder letter will be sent to the patient regarding the next appointment. BI-RADS CATEGORY 4: Suspicious. Electronically Signed: By: Tara Harris M.D. On: 12/26/2019 11:48      Problem List/Past Medical Tara Key K. [name], MD; 04/28/2020 3:11 PM) RETRACTION OF LEFT NIPPLE (Z61.09)  Past Surgical History Tara Harris, RMA; 04/28/2020 11:00 AM) Cataract Surgery Bilateral. Tonsillectomy  Diagnostic Studies History Tara Harris, [place]; 04/28/2020 11:00 AM) Mammogram >3 years ago Pap Smear >5 years ago  Allergies Tara Harris, RMA; 04/28/2020 11:01 AM) No Known Allergies [04/28/2020]: Allergies Reconciled  Medication History Tara Harris, [place]; 04/28/2020 11:03 AM) Atorvastatin Calcium (10MG  Tablet, Oral) Active. Levothyroxine Sodium (50MCG Tablet, Oral) Active. Multivitamin (Oral) Active. Aspir-Low (81MG  Tablet DR, Oral) Active. Medications Reconciled  Social History Tara Harris, [place]; 04/28/2020 11:00 AM) Caffeine use Carbonated beverages, Coffee, Tea. No alcohol use No drug use Tobacco use Never smoker.  Family History Tara Harris, [place]; 04/28/2020 11:00 AM) Family history unknown First Degree Relatives  Pregnancy / Birth History Tara Harris, [place]; 04/28/2020 11:00 AM) Age at menarche 65 years. Age of menopause 51-55 Contraceptive History Oral contraceptives. Gravida 2 Maternal age 50-30  Other Problems  Tara Harris. Tara Donlon, MD; 04/28/2020 3:11 PM) Bladder Problems Thyroid Disease     Review of Systems Tara Harris RMA; 04/28/2020 11:01 AM) General Not Present- Appetite Loss, Chills, Fatigue, Fever, Night Sweats, Weight Gain and Weight Loss. Skin Present- Dryness. Not Present- Change in Wart/Mole, Hives, Jaundice, New Lesions, Non-Healing Wounds,  Rash and Ulcer. HEENT Present- Seasonal Allergies. Not Present- Earache, Hearing Loss, Hoarseness, Nose Bleed, Oral Ulcers, Ringing in the Ears, Sinus Pain, Sore Throat, Visual Disturbances, Wears glasses/contact lenses and Yellow Eyes. Breast Not Present- Breast Mass, Breast Pain, Nipple Discharge and Skin Changes. Gastrointestinal Not Present- Abdominal Pain, Bloating, Bloody Stool, Change in Bowel Habits, Chronic diarrhea, Constipation, Difficulty Swallowing, Excessive gas, Gets full quickly at meals, Hemorrhoids, Indigestion, Nausea, Rectal Pain and Vomiting. Female Genitourinary Present- Urgency. Not Present- Frequency, Nocturia, Painful Urination and Pelvic Pain. Musculoskeletal Not Present- Back Pain, Joint Pain, Joint Stiffness, Muscle Pain, Muscle Weakness and Swelling of Extremities. Neurological Present- Trouble walking. Not Present- Decreased Memory, Fainting, Headaches, Numbness, Seizures, Tingling, Tremor and Weakness.  Vitals Tara Harris [place] RMA; 04/28/2020 11:04 AM) 04/28/2020 11:03 AM Weight: 109.13 lb Height: 65in Body Surface Area: 1.53 m Body Mass Index: 18.16 kg/m  Temp.: 98.62F  Pulse: 75 (Regular)  P.OX: 92% (Room air) BP: 102/64(Sitting, Left Arm, Standard)        Physical Exam Tara Key K. Jadier Rockers MD; 04/28/2020 3:13 PM)  The physical exam findings are as follows: Note:Constitutional: WDWN in NAD, conversant, no obvious deformities; resting comfortably Eyes: Pupils equal, round; sclera anicteric; moist conjunctiva; no lid lag HENT: Oral mucosa moist; good dentition Neck: No masses palpated, trachea  midline; no thyromegaly Lungs: CTA bilaterally; normal respiratory effort Breasts: symmetric; no dominant masses; no axillary lymphadenopathy Right breast - normal nipple Left breast - nipple centrally retracted with eschar; when eschar removed, there is a small central raw region with some pink discoloration. Firm under nipple. CV: Regular rate and rhythm; no murmurs; extremities well-perfused with no edema Abd: +bowel sounds, soft, non-tender, no palpable organomegaly; no palpable hernias Musc: Normal gait; no apparent clubbing or cyanosis in extremities Lymphatic: No palpable cervical or axillary lymphadenopathy Skin: Warm, dry; no sign of jaundice Psychiatric - alert and oriented x 4; calm mood and affect    Assessment & Plan Tara Key K. Chere Babson MD; 04/28/2020 3:13 PM)  RETRACTION OF LEFT NIPPLE (N64.53)  Current Plans Schedule for Surgery - Left central nipple excision. The surgical procedure has been discussed with the patient. Potential risks, benefits, alternative treatments, and expected outcomes have been explained. All of the patient's questions at this time have been answered. The likelihood of reaching the patient's treatment goal is good. The patient understand the proposed surgical procedure and wishes to proceed. Note:Obviously, this appears to be a slow moving process as the patient has had this for several months. Her consultation with those was delayed due to her other medical issues. However I explained to her that I would recommend removing the central part of her nipple as well as the breast tissue behind this area. This will likely leave her with the areola only. She has no problem with this. We will schedule her surgery soon as possible.  Tara Harris. Georgette Dover, MD, [facility] Surgery  General/ Trauma Surgery   04/28/2020 3:14 PM

## 2020-05-09 ENCOUNTER — Telehealth: Payer: Self-pay | Admitting: Family Medicine

## 2020-05-09 NOTE — Telephone Encounter (Signed)
Patient is calling in stating that she received a call from someone in the office. No notes in the system. EM

## 2020-06-10 ENCOUNTER — Other Ambulatory Visit: Payer: Self-pay

## 2020-06-10 ENCOUNTER — Encounter (HOSPITAL_BASED_OUTPATIENT_CLINIC_OR_DEPARTMENT_OTHER): Payer: Self-pay | Admitting: Surgery

## 2020-06-11 NOTE — Progress Notes (Signed)
Reviewed pulm notes with Dr. Donavan Foil at [facility]. Patient states she is able to walk to and from mailbox and do ADLs without feeling SOB. Okay to proceed with surgery.

## 2020-06-13 ENCOUNTER — Other Ambulatory Visit (HOSPITAL_COMMUNITY)
Admission: RE | Admit: 2020-06-13 | Discharge: 2020-06-13 | Disposition: A | Discharge status: Home / Self Care | Payer: Medicare Other | Source: Ambulatory Visit | Attending: Surgery | Admitting: Surgery

## 2020-06-13 DIAGNOSIS — Z01812 Encounter for preprocedural laboratory examination: Secondary | ICD-10-CM | POA: Insufficient documentation

## 2020-06-13 DIAGNOSIS — Z20822 Contact with and (suspected) exposure to covid-19: Secondary | ICD-10-CM | POA: Insufficient documentation

## 2020-06-13 LAB — SARS CORONAVIRUS 2 (TAT 6-24 HRS): SARS Coronavirus 2: NEGATIVE

## 2020-06-16 NOTE — Progress Notes (Signed)

## 2020-06-17 ENCOUNTER — Encounter (HOSPITAL_BASED_OUTPATIENT_CLINIC_OR_DEPARTMENT_OTHER)
Admission: RE | Disposition: A | Discharge status: Home / Self Care | Payer: Self-pay | Source: Home / Self Care | Attending: Surgery

## 2020-06-17 ENCOUNTER — Other Ambulatory Visit: Payer: Self-pay

## 2020-06-17 ENCOUNTER — Ambulatory Visit (HOSPITAL_BASED_OUTPATIENT_CLINIC_OR_DEPARTMENT_OTHER): Payer: Medicare Other | Admitting: Anesthesiology

## 2020-06-17 ENCOUNTER — Encounter (HOSPITAL_BASED_OUTPATIENT_CLINIC_OR_DEPARTMENT_OTHER): Payer: Self-pay | Admitting: Surgery

## 2020-06-17 ENCOUNTER — Ambulatory Visit (HOSPITAL_BASED_OUTPATIENT_CLINIC_OR_DEPARTMENT_OTHER)
Admission: RE | Admit: 2020-06-17 | Discharge: 2020-06-17 | Disposition: A | Discharge status: Home / Self Care | Payer: Medicare Other | Attending: Surgery | Admitting: Surgery

## 2020-06-17 DIAGNOSIS — Z7982 Long term (current) use of aspirin: Secondary | ICD-10-CM | POA: Insufficient documentation

## 2020-06-17 DIAGNOSIS — N6452 Nipple discharge: Secondary | ICD-10-CM | POA: Diagnosis present

## 2020-06-17 DIAGNOSIS — Z79890 Hormone replacement therapy (postmenopausal): Secondary | ICD-10-CM | POA: Insufficient documentation

## 2020-06-17 DIAGNOSIS — Z9181 History of falling: Secondary | ICD-10-CM | POA: Insufficient documentation

## 2020-06-17 DIAGNOSIS — L905 Scar conditions and fibrosis of skin: Secondary | ICD-10-CM | POA: Diagnosis not present

## 2020-06-17 DIAGNOSIS — Z79899 Other long term (current) drug therapy: Secondary | ICD-10-CM | POA: Diagnosis not present

## 2020-06-17 HISTORY — PX: BREAST CYST EXCISION: SHX579

## 2020-06-17 HISTORY — DX: Hypothyroidism, unspecified: E03.9

## 2020-06-17 HISTORY — DX: Pulmonary fibrosis, unspecified: J84.10

## 2020-06-17 SURGERY — EXCISION, CYST, BREAST
Anesthesia: Monitor Anesthesia Care | Site: Breast | Laterality: Left

## 2020-06-17 MED ORDER — ONDANSETRON HCL 4 MG/2ML IJ SOLN
INTRAMUSCULAR | Status: DC | PRN
  Administered 2020-06-17: 4 mg via INTRAVENOUS

## 2020-06-17 MED ORDER — LIDOCAINE 2% (20 MG/ML) 5 ML SYRINGE
INTRAMUSCULAR | Status: AC
  Filled 2020-06-17: qty 5

## 2020-06-17 MED ORDER — ONDANSETRON HCL 4 MG/2ML IJ SOLN: 4.0000 mg | Freq: Once | INTRAMUSCULAR | Status: DC | PRN

## 2020-06-17 MED ORDER — PROPOFOL 500 MG/50ML IV EMUL
INTRAVENOUS | Status: DC | PRN
  Administered 2020-06-17: 75 ug/kg/min via INTRAVENOUS

## 2020-06-17 MED ORDER — LACTATED RINGERS IV SOLN: INTRAVENOUS | Status: DC

## 2020-06-17 MED ORDER — ACETAMINOPHEN 500 MG PO TABS
ORAL_TABLET | ORAL | Status: AC
  Filled 2020-06-17: qty 2

## 2020-06-17 MED ORDER — CEFAZOLIN SODIUM-DEXTROSE 2-4 GM/100ML-% IV SOLN
INTRAVENOUS | Status: AC
  Filled 2020-06-17: qty 100

## 2020-06-17 MED ORDER — ACETAMINOPHEN 500 MG PO TABS
1000.0000 mg | ORAL_TABLET | ORAL | Status: AC
  Administered 2020-06-17: 1000 mg via ORAL

## 2020-06-17 MED ORDER — FENTANYL CITRATE (PF) 100 MCG/2ML IJ SOLN
INTRAMUSCULAR | Status: DC | PRN
  Administered 2020-06-17 (×2): 25 ug via INTRAVENOUS

## 2020-06-17 MED ORDER — GABAPENTIN 300 MG PO CAPS
ORAL_CAPSULE | ORAL | Status: AC
  Filled 2020-06-17: qty 1

## 2020-06-17 MED ORDER — CEFAZOLIN SODIUM-DEXTROSE 2-4 GM/100ML-% IV SOLN
2.0000 g | INTRAVENOUS | Status: AC
  Administered 2020-06-17: 2 g via INTRAVENOUS

## 2020-06-17 MED ORDER — CHLORHEXIDINE GLUCONATE CLOTH 2 % EX PADS: 6.0000 | MEDICATED_PAD | Freq: Once | CUTANEOUS | Status: DC

## 2020-06-17 MED ORDER — BUPIVACAINE-EPINEPHRINE 0.25% -1:200000 IJ SOLN
INTRAMUSCULAR | Status: DC | PRN
  Administered 2020-06-17: 20 mL

## 2020-06-17 MED ORDER — GABAPENTIN 300 MG PO CAPS: 300.0000 mg | ORAL_CAPSULE | ORAL | Status: DC

## 2020-06-17 MED ORDER — FENTANYL CITRATE (PF) 100 MCG/2ML IJ SOLN
INTRAMUSCULAR | Status: AC
  Filled 2020-06-17: qty 2

## 2020-06-17 MED ORDER — PROPOFOL 10 MG/ML IV BOLUS
INTRAVENOUS | Status: DC | PRN
  Administered 2020-06-17: 10 mg via INTRAVENOUS

## 2020-06-17 MED ORDER — HYDROMORPHONE HCL 1 MG/ML IJ SOLN: 0.2500 mg | INTRAMUSCULAR | Status: DC | PRN

## 2020-06-17 SURGICAL SUPPLY — 40 items
ADH SKN CLS APL DERMABOND .7 (GAUZE/BANDAGES/DRESSINGS) ×1
APL PRP STRL LF DISP 70% ISPRP (MISCELLANEOUS) ×1
APL SKNCLS STERI-STRIP NONHPOA (GAUZE/BANDAGES/DRESSINGS) ×1
BENZOIN TINCTURE PRP APPL 2/3 (GAUZE/BANDAGES/DRESSINGS) ×2 IMPLANT
BLADE HEX COATED 2.75 (ELECTRODE) ×2 IMPLANT
BLADE SURG 15 STRL LF DISP TIS (BLADE) ×1 IMPLANT
BLADE SURG 15 STRL SS (BLADE) ×2
CANISTER SUCT 1200ML W/VALVE (MISCELLANEOUS) ×2 IMPLANT
CHLORAPREP W/TINT 26 (MISCELLANEOUS) ×2 IMPLANT
COVER BACK TABLE 60X90IN (DRAPES) ×2 IMPLANT
COVER MAYO STAND STRL (DRAPES) ×2 IMPLANT
DERMABOND ADVANCED (GAUZE/BANDAGES/DRESSINGS) ×1
DERMABOND ADVANCED .7 DNX12 (GAUZE/BANDAGES/DRESSINGS) IMPLANT
DRAPE LAPAROTOMY 100X72 PEDS (DRAPES) ×2 IMPLANT
DRAPE UTILITY XL STRL (DRAPES) ×2 IMPLANT
DRSG TEGADERM 4X4.75 (GAUZE/BANDAGES/DRESSINGS) ×2 IMPLANT
ELECT REM PT RETURN 9FT ADLT (ELECTROSURGICAL) ×2
ELECTRODE REM PT RTRN 9FT ADLT (ELECTROSURGICAL) ×1 IMPLANT
GAUZE SPONGE 4X4 12PLY STRL LF (GAUZE/BANDAGES/DRESSINGS) ×2 IMPLANT
GLOVE SURG ENC MOIS LTX SZ6.5 (GLOVE) ×1 IMPLANT
GLOVE SURG ENC MOIS LTX SZ7 (GLOVE) ×2 IMPLANT
GLOVE SURG UNDER POLY LF SZ7.5 (GLOVE) ×2 IMPLANT
GOWN STRL REUS W/ TWL LRG LVL3 (GOWN DISPOSABLE) ×2 IMPLANT
GOWN STRL REUS W/TWL LRG LVL3 (GOWN DISPOSABLE) ×4
NDL HYPO 25X1 1.5 SAFETY (NEEDLE) ×1 IMPLANT
NEEDLE HYPO 25X1 1.5 SAFETY (NEEDLE) ×2 IMPLANT
NS IRRIG 1000ML POUR BTL (IV SOLUTION) ×2 IMPLANT
PACK BASIN DAY SURGERY FS (CUSTOM PROCEDURE TRAY) ×2 IMPLANT
PENCIL SMOKE EVACUATOR (MISCELLANEOUS) ×2 IMPLANT
SLEEVE SCD COMPRESS KNEE MED (MISCELLANEOUS) ×2 IMPLANT
SPONGE LAP 4X18 RFD (DISPOSABLE) ×2 IMPLANT
STRIP CLOSURE SKIN 1/2X4 (GAUZE/BANDAGES/DRESSINGS) ×2 IMPLANT
SUT MON AB 4-0 PC3 18 (SUTURE) ×2 IMPLANT
SUT VIC AB 3-0 SH 27 (SUTURE) ×2
SUT VIC AB 3-0 SH 27X BRD (SUTURE) ×1 IMPLANT
SYR BULB EAR ULCER 3OZ GRN STR (SYRINGE) ×2 IMPLANT
SYR CONTROL 10ML LL (SYRINGE) ×2 IMPLANT
TOWEL GREEN STERILE FF (TOWEL DISPOSABLE) ×2 IMPLANT
TUBE CONNECTING 20X1/4 (TUBING) ×2 IMPLANT
YANKAUER SUCT BULB TIP NO VENT (SUCTIONS) ×2 IMPLANT

## 2020-06-17 NOTE — H&P (Signed)
History of Present Illness  The patient is a 87 year old female who presents with a complaint of nipple discharge. Referred by Dr. Elsie Stain for left nipple discharge/ retraction  This is an 87 year old female who lives independently who presents with recent onset of left nipple retraction and occasional discharge. In July 2021, she underwent diagnostic mammogram and ultrasound. Physical examination at that time revealed retraction of left nipple with a small flesh-colored soft tissue mass involving the nipple. Ultrasound was unremarkable. Mammogram showed dense tissue in the retroareolar left breast. Patient reports no further discharge. Her constipation was delayed due to other medical problems. She was hospitalized after multiple falls. One of these resulted in a subdural hematoma. She currently is neurologically intact. She presents now to discuss her nipple retraction.  DATA: Patient presents with new left nipple retraction and abnormality of the left nipple. EXAM: DIGITAL DIAGNOSTIC BILATERAL MAMMOGRAM WITH CAD AND TOMO ULTRASOUND LEFT BREAST COMPARISON: Previous exam(s). ACR Breast Density Category c: The breast tissue is heterogeneously dense, which may obscure small masses. FINDINGS: Interval increased density within the breast bilaterally most compatible with interval weight loss. No concerning masses, calcifications or nonsurgical distortion identified within the right or left breast. Dense tissue is demonstrated within the retroareolar left breast. Mammographic images were processed with CAD. On physical exam, the left nipple is retracted. There is a small flesh-colored soft tissue mass involving the nipple, best demonstrated with palpation. Targeted ultrasound is performed, showing no discrete retroareolar mass within the left breast. Dense tissue is visualized, corresponding with the density on mammography. IMPRESSION: Suspicious changes to the left nipple  including small flesh-colored mass and retraction. Possibility of Paget's disease should be considered. Density within the retroareolar left breast likely represents dense fibroglandular tissue. RECOMMENDATION: Surgical consultation for biopsy of the abnormality of the left nipple to exclude Paget's disease. Bilateral breast MRI for further evaluation given nipple changes as well as retroareolar density. I have discussed the findings and recommendations with the patient. If applicable, a reminder letter will be sent to the patient regarding the next appointment. BI-RADS CATEGORY 4: Suspicious. Electronically Signed: By: Lovey Newcomer M.D. On: 12/26/2019 11:48      Problem List/Past Medical RETRACTION OF LEFT NIPPLE (N64.53)  Past Surgical History  Cataract Surgery Bilateral. Tonsillectomy  Diagnostic Studies History  Mammogram >3 years ago Pap Smear >5 years ago  Allergies  No Known Allergies  Allergies Reconciled  Medication History  Atorvastatin Calcium (10MG  Tablet, Oral) Active. Levothyroxine Sodium (50MCG Tablet, Oral) Active. Multivitamin (Oral) Active. Aspir-Low (81MG  Tablet DR, Oral) Active. Medications Reconciled  Social History Caffeine use Carbonated beverages, Coffee, Tea. No alcohol use No drug use Tobacco use Never smoker.  Family History  Family history unknown First Degree Relatives  Pregnancy / Birth History Age at menarche 72 years. Age of menopause 51-55 Contraceptive History Oral contraceptives. Gravida 2 Maternal age 50-30  Other Problems Bladder Problems Thyroid Disease     Review of Systems  General Not Present- Appetite Loss, Chills, Fatigue, Fever, Night Sweats, Weight Gain and Weight Loss. Skin Present- Dryness. Not Present- Change in Wart/Mole, Hives, Jaundice, New Lesions, Non-Healing Wounds, Rash and Ulcer. HEENT Present- Seasonal Allergies. Not Present- Earache, Hearing Loss,  Hoarseness, Nose Bleed, Oral Ulcers, Ringing in the Ears, Sinus Pain, Sore Throat, Visual Disturbances, Wears glasses/contact lenses and Yellow Eyes. Breast Not Present- Breast Mass, Breast Pain, Nipple Discharge and Skin Changes. Gastrointestinal Not Present- Abdominal Pain, Bloating, Bloody Stool, Change in Bowel Habits, Chronic diarrhea, Constipation, Difficulty Swallowing,  Excessive gas, Gets full quickly at meals, Hemorrhoids, Indigestion, Nausea, Rectal Pain and Vomiting. Female Genitourinary Present- Urgency. Not Present- Frequency, Nocturia, Painful Urination and Pelvic Pain. Musculoskeletal Not Present- Back Pain, Joint Pain, Joint Stiffness, Muscle Pain, Muscle Weakness and Swelling of Extremities. Neurological Present- Trouble walking. Not Present- Decreased Memory, Fainting, Headaches, Numbness, Seizures, Tingling, Tremor and Weakness.  Vitals  Weight: 109.13 lb Height: 65in Body Surface Area: 1.53 m Body Mass Index: 18.16 kg/m  Temp.: 98.60F  Pulse: 75 (Regular)  P.OX: 92% (Room air) BP: 102/64(Sitting, Left Arm, Standard)        Physical Exam   The physical exam findings are as follows: Note:Constitutional: WDWN in NAD, conversant, no obvious deformities; resting comfortably Eyes: Pupils equal, round; sclera anicteric; moist conjunctiva; no lid lag HENT: Oral mucosa moist; good dentition Neck: No masses palpated, trachea midline; no thyromegaly Lungs: CTA bilaterally; normal respiratory effort Breasts: symmetric; no dominant masses; no axillary lymphadenopathy Right breast - normal nipple Left breast - nipple centrally retracted with eschar; when eschar removed, there is a small central raw region with some pink discoloration. Firm under nipple. CV: Regular rate and rhythm; no murmurs; extremities well-perfused with no edema Abd: +bowel sounds, soft, non-tender, no palpable organomegaly; no palpable hernias Musc: Normal gait; no apparent clubbing  or cyanosis in extremities Lymphatic: No palpable cervical or axillary lymphadenopathy Skin: Warm, dry; no sign of jaundice Psychiatric - alert and oriented x 4; calm mood and affect    Assessment & Plan   RETRACTION OF LEFT NIPPLE (N64.53)  Current Plans Schedule for Surgery - Left central nipple excision. The surgical procedure has been discussed with the patient. Potential risks, benefits, alternative treatments, and expected outcomes have been explained. All of the patient's questions at this time have been answered. The likelihood of reaching the patient's treatment goal is good. The patient understand the proposed surgical procedure and wishes to proceed. Note:Obviously, this appears to be a slow moving process as the patient has had this for several months. Her consultation with those was delayed due to her other medical issues. However I explained to her that I would recommend removing the central part of her nipple as well as the breast tissue behind this area. This will likely leave her with the areola only. She has no problem with this. We will schedule her surgery soon as possible.   Imogene Burn. Georgette Dover, MD, [facility]  General/ Trauma Surgery   06/17/2020 8:26 AM

## 2020-06-17 NOTE — Transfer of Care (Signed)
Immediate Anesthesia Transfer of Care Note  Patient: Tara Harris  Procedure(s) Performed: LEFT CENTRAL NIPPLE EXCISION (Left Breast)  Patient Location: PACU  Anesthesia Type:MAC  Level of Consciousness: awake, alert  and oriented  Airway & Oxygen Therapy: Patient Spontanous Breathing and Patient connected to face mask oxygen  Post-op Assessment: Report given to RN and Post -op Vital signs reviewed and stable  Post vital signs: Reviewed and stable  Last Vitals:  Vitals Value Taken Time  BP 113/52 06/17/20 0930  Temp    Pulse    Resp 16 06/17/20 0931  SpO2    Vitals shown include unvalidated device data.  Last Pain:  Vitals:   06/17/20 0741  TempSrc: Oral  PainSc: 0-No pain         Complications: No complications documented.

## 2020-06-17 NOTE — Discharge Instructions (Signed)
[place] Office Phone Number [phone]  BREAST SURGERY POST OP INSTRUCTIONS  Always review your discharge instruction sheet given to you by the facility where your surgery was performed.  IF YOU HAVE DISABILITY OR FAMILY LEAVE FORMS, YOU MUST BRING THEM TO THE OFFICE FOR PROCESSING.  DO NOT GIVE THEM TO YOUR DOCTOR.  1. A prescription for pain medication may be given to you upon discharge.  Take your pain medication as prescribed, if needed.  If narcotic pain medicine is not needed, then you may take acetaminophen (Tylenol) or ibuprofen (Advil) as needed. 2. Take your usually prescribed medications unless otherwise directed 3. If you need a refill on your pain medication, please contact your pharmacy.  They will contact our office to request authorization.  Prescriptions will not be filled after 5pm or on week-ends. 4. You should eat very light the first 24 hours after surgery, such as soup, crackers, pudding, etc.  Resume your normal diet the day after surgery. 5. Most patients will experience some swelling and bruising in the breast.  Ice packs and a good support bra will help.  Swelling and bruising can take several days to resolve.  6. It is common to experience some constipation if taking pain medication after surgery.  Increasing fluid intake and taking a stool softener will usually help or prevent this problem from occurring.  A mild laxative (Milk of Magnesia or Miralax) should be taken according to package directions if there are no bowel movements after 48 hours. 7. Unless discharge instructions indicate otherwise, you may remove your bandages 24-48 hours after surgery, and you may shower at that time.  You may have steri-strips (small skin tapes) in place directly over the incision.  These strips should be left on the skin for 7-10 days.  If your surgeon used skin glue on the incision, you may shower in 24 hours.  The glue will flake off over the next 2-3 weeks.  Any  sutures or staples will be removed at the office during your follow-up visit. 8. ACTIVITIES:  You may resume regular daily activities (gradually increasing) beginning the next day.  Wearing a good support bra or sports bra minimizes pain and swelling.  You may have sexual intercourse when it is comfortable. a. You may drive when you no longer are taking prescription pain medication, you can comfortably wear a seatbelt, and you can safely maneuver your car and apply brakes. b. RETURN TO WORK:  ______________________________________________________________________________________ 9. You should see your doctor in the office for a follow-up appointment approximately two weeks after your surgery.  Your doctor's nurse will typically make your follow-up appointment when she calls you with your pathology report.  Expect your pathology report 2-3 business days after your surgery.  You may call to check if you do not hear from [place] after three days. 10. OTHER INSTRUCTIONS: _______________________________________________________________________________________________ _____________________________________________________________________________________________________________________________________ _____________________________________________________________________________________________________________________________________ _____________________________________________________________________________________________________________________________________  WHEN TO CALL YOUR DOCTOR: 1. Fever over 101.0 2. Nausea and/or vomiting. 3. Extreme swelling or bruising. 4. Continued bleeding from incision. 5. Increased pain, redness, or drainage from the incision.  The clinic staff is available to answer your questions during regular business hours.  Please don't hesitate to call and ask to speak to one of the nurses for clinical concerns.  If you have a medical emergency, go to the nearest emergency room or call  911.  A surgeon from [facility] is always on call at the hospital.  For further questions, please visit [email or web address]  Next Dose of Tylenol can be taken at 145pm today.   Post Anesthesia Home Care Instructions  Activity: Get plenty of rest for the remainder of the day. A responsible individual must stay with you for 24 hours following the procedure.  For the next 24 hours, DO NOT: -Drive a car -Paediatric nurse -Drink alcoholic beverages -Take any medication unless instructed by your physician -Make any legal decisions or sign important papers.  Meals: Start with liquid foods such as gelatin or soup. Progress to regular foods as tolerated. Avoid greasy, spicy, heavy foods. If nausea and/or vomiting occur, drink only clear liquids until the nausea and/or vomiting subsides. Call your physician if vomiting continues.  Special Instructions/Symptoms: Your throat may feel dry or sore from the anesthesia or the breathing tube placed in your throat during surgery. If this causes discomfort, gargle with warm salt water. The discomfort should disappear within 24 hours.  If you had a scopolamine patch placed behind your ear for the management of post- operative nausea and/or vomiting:  1. The medication in the patch is effective for 72 hours, after which it should be removed.  Wrap patch in a tissue and discard in the trash. Wash hands thoroughly with soap and water. 2. You may remove the patch earlier than 72 hours if you experience unpleasant side effects which may include dry mouth, dizziness or visual disturbances. 3. Avoid touching the patch. Wash your hands with soap and water after contact with the patch.

## 2020-06-17 NOTE — Anesthesia Preprocedure Evaluation (Signed)
Anesthesia Evaluation  Patient identified by MRN, date of birth, ID band Patient awake    Reviewed: NPO status , Patient's Chart, lab work & pertinent test results  Airway Mallampati: II  TM Distance: >3 FB Neck ROM: Full    Dental  (+) Teeth Intact   Pulmonary  pulm fibrosis   Pulmonary exam normal        Cardiovascular negative cardio ROS   Rhythm:Regular Rate:Normal     Neuro/Psych negative neurological ROS  negative psych ROS   GI/Hepatic Neg liver ROS, IBS   Endo/Other  Hypothyroidism   Renal/GU negative Renal ROS  negative genitourinary   Musculoskeletal  (+) Arthritis , Osteoarthritis,    Abdominal (+)  Abdomen: soft. Bowel sounds: normal.  Peds  Hematology  (+) anemia ,   Anesthesia Other Findings   Reproductive/Obstetrics                             Anesthesia Physical Anesthesia Plan  ASA: II  Anesthesia Plan: MAC   Post-op Pain Management:    Induction: Intravenous  PONV Risk Score and Plan: 2 and Ondansetron, Dexamethasone, Treatment may vary due to age or medical condition and Propofol infusion  Airway Management Planned: Simple Face Mask, Natural Airway and Nasal Cannula  Additional Equipment: None  Intra-op Plan:   Post-operative Plan:   Informed Consent: I have reviewed the patients History and Physical, chart, labs and discussed the procedure including the risks, benefits and alternatives for the proposed anesthesia with the patient or authorized representative who has indicated his/her understanding and acceptance.     Dental advisory given  Plan Discussed with: CRNA  Anesthesia Plan Comments: (Covid-19 Nucleic Acid Test Results Lab Results      Component                Value               Date                      SARSCOV2NAA              NEGATIVE            06/13/2020                [place]              NEGATIVE            02/17/2020           )       Anesthesia Quick Evaluation

## 2020-06-17 NOTE — Anesthesia Postprocedure Evaluation (Signed)
Anesthesia Post Note  Patient: Tara Harris  Procedure(s) Performed: LEFT CENTRAL NIPPLE EXCISION (Left Breast)     Patient location during evaluation: PACU Anesthesia Type: MAC Level of consciousness: awake and alert Pain management: pain level controlled Vital Signs Assessment: post-procedure vital signs reviewed and stable Respiratory status: spontaneous breathing, nonlabored ventilation, respiratory function stable and patient connected to nasal cannula oxygen Cardiovascular status: stable and blood pressure returned to baseline Postop Assessment: no apparent nausea or vomiting Anesthetic complications: no   No complications documented.  Last Vitals:  Vitals:   06/17/20 0957 06/17/20 1015  BP: (!) 113/58 (!) 114/59  Pulse: 71 95  Resp: 16 18  Temp:  36.4 C  SpO2: 92% 99%    Last Pain:  Vitals:   06/17/20 1015  TempSrc:   PainSc: 0-No pain                 Belenda Cruise P Mauri Tolen

## 2020-06-17 NOTE — Op Note (Signed)
Preop diagnosis: Left nipple discharge nipple retraction Postop diagnosis: Same Procedure performed: Left nipple excision Surgeon:Mavrik Bynum K Davonn Flanery Anesthesia: Local MAC Indications:This is an 87 year old female who lives independently who presents with recent onset of left nipple retraction and occasional discharge. In July 2021, she underwent diagnostic mammogram and ultrasound. Physical examination at that time revealed retraction of left nipple with a small flesh-colored soft tissue mass involving the nipple. Ultrasound was unremarkable. Mammogram showed dense tissue in the retroareolar left breast. Patient reports no further discharge. Her constipation was delayed due to other medical problems. She was hospitalized after multiple falls. One of these resulted in a subdural hematoma. She currently is neurologically intact. She presents now for excision of the central part of her nipple for diagnosis and treatment.  Description of procedure: The patient was brought to the operating room and placed in the supine position on the operating room table.  She was given some intravenous sedation.  Her left breast was prepped with ChloraPrep and draped in sterile fashion.  The left nipple is obviously retracted with some crusting eschar.  A timeout was taken to ensure the proper patient and proper procedure.  We infiltrated the entire nipple with local anesthetic.  I then made an elliptical incision around the entire central part of the nipple leaving only the areola.  We dissected through the skin down into the underlying retroareolar breast tissue.  No palpable masses are noted in this area.  We excised the entire central nipple and sent this for pathologic examination.  We inspected for hemostasis.  The incision was then closed in a transverse fashion with 3-0 Vicryl and 4-0 Monocryl.  Dermabond was used to seal the skin incision.  She was then extubated and brought to the recovery room in stable  condition.  All sponge, instrument, and needle counts are correct.  Imogene Burn. Georgette Dover, MD, [facility]  General/ Trauma Surgery   06/17/2020 9:26 AM

## 2020-06-18 ENCOUNTER — Encounter (HOSPITAL_BASED_OUTPATIENT_CLINIC_OR_DEPARTMENT_OTHER): Payer: Self-pay | Admitting: Surgery

## 2020-06-18 LAB — SURGICAL PATHOLOGY

## 2020-08-14 ENCOUNTER — Encounter: Payer: Self-pay | Admitting: Family Medicine

## 2020-08-14 ENCOUNTER — Ambulatory Visit (INDEPENDENT_AMBULATORY_CARE_PROVIDER_SITE_OTHER): Payer: Medicare Other | Admitting: Family Medicine

## 2020-08-14 ENCOUNTER — Other Ambulatory Visit: Payer: Self-pay

## 2020-08-14 VITALS — BP 112/72 | HR 80 | Temp 97.3°F | Ht 66.0 in | Wt 106.8 lb

## 2020-08-14 DIAGNOSIS — I739 Peripheral vascular disease, unspecified: Secondary | ICD-10-CM

## 2020-08-14 DIAGNOSIS — R209 Unspecified disturbances of skin sensation: Secondary | ICD-10-CM

## 2020-08-14 DIAGNOSIS — R2681 Unsteadiness on feet: Secondary | ICD-10-CM | POA: Diagnosis not present

## 2020-08-14 DIAGNOSIS — R0989 Other specified symptoms and signs involving the circulatory and respiratory systems: Secondary | ICD-10-CM | POA: Diagnosis not present

## 2020-08-14 NOTE — Progress Notes (Signed)
T. , MD, [place] at [facility] [place], [address]  Phone: [phone]  FAX: [place] - 87 y.o. female  MRN [id number]  Date of Birth: 1932-05-04  Date: 08/14/2020  PCP: Tonia Ghent, MD  Referral: Tonia Ghent, MD  Chief Complaint  Patient presents with  . Leg Pain    Bilateral-Left is worse-States they are cold/numb    This visit occurred during the SARS-CoV-2 public health emergency.  Safety protocols were in place, including screening questions prior to the visit, additional usage of staff PPE, and extensive cleaning of exam room while observing appropriate contact time as indicated for disinfecting solutions.   Subjective:   Tara Harris is a 87 y.o. very pleasant female patient with Body mass index is 17.23 kg/m. who presents with the following:  She is a very nice 87 year old, young for age, she presents with some bilateral leg pain and cold legs.  This is not severe, but she does think that the cooling nature of her legs has been slowly progressing over time.  This did start about 1 month ago, but approximately 2 months ago it did start to get somewhat worse.  She described a trauma about 1 year ago where she fell.  She also describes some significant balance disturbance and concern for falls.  On our discussion, it does not seem like she is ever done some extensive balance work.  She does live alone at this point, and her husband passed away several years ago.  Review of Systems is noted in the HPI, as appropriate   Objective:   BP 112/72   Pulse 80   Temp (!) 97.3 F (36.3 C) (Temporal)   Ht 5\' 6"  (1.676 m)   Wt 106 lb 12 oz (48.4 kg)   SpO2 96%   BMI 17.23 kg/m   GEN: No acute distress; alert,appropriate. PULM: Breathing comfortably in no respiratory distress PSYCH: Normally interactive.    Bilateral  lower extremities: Patient does not have any lower extremity edema On my examination, patient does have some decreased pulses DP and PT bilateral feet. She also on the left greater than the right does have some significant coolness particularly in the feet themselves compared to the other lower extremity.  Radiology: No results found.  Assessment and Plan:     ICD-10-CM   1. Unsteady gait  R26.81 Ambulatory referral to Physical Therapy  2. Decreased pulses in feet  R09.89 VAS [place] LOWER EXT ART SEG MULTI (SEGMENTALS & LE RAYNAUDS)  3. Sensation of cold in lower extremity  R20.9 VAS [place] LOWER EXT ART SEG MULTI (SEGMENTALS & LE RAYNAUDS)  4. Intermittent claudication (HCC)  I73.9 VAS [place] LOWER EXT ART SEG MULTI (SEGMENTALS & LE RAYNAUDS)   Cool feet, decreased pulses, with some pain, I do worry that she may have arterial disease that is the driving factor here.  Check ABIs, and if negative consider evaluation by vascular surgery versus interventional cardiology.  Balance disturbance.  I do think it be helpful for her to do some of the gait training and balance training that is offered at the hospital.  I think that is one of the best things that could be done for her at this point.  Medications Discontinued During This Encounter  Medication Reason  . sodium chloride 1 g tablet Duplicate   Orders Placed This Encounter  Procedures  .  Ambulatory referral to Physical Therapy  . VAS [place] LOWER EXT ART SEG MULTI (SEGMENTALS & LE RAYNAUDS)    Follow-up: No follow-ups on file.  Signed,  Maud Deed. , MD   Outpatient Encounter Medications as of 08/14/2020  Medication Sig  . sodium chloride 1 g tablet Take 1 g by mouth daily.  Marland Kitchen atorvastatin (LIPITOR) 10 MG tablet TAKE 1 TABLET DAILY AT 6 P.M.  . Cholecalciferol (VITAMIN D) 50 MCG (2000 UT) CAPS Take 1 capsule (2,000 Units total) by mouth daily.  Marland Kitchen levothyroxine (SYNTHROID) 50 MCG tablet TAKE 50 MCG BY MOUTH DAILY ON EMPTY STOMACH.  .  Multiple Vitamins-Minerals (MULTIVITAMIN WITH MINERALS) tablet Take 1 tablet by mouth daily after breakfast.   . polyethylene glycol (MIRALAX / GLYCOLAX) packet Take 17 g by mouth daily as needed for mild constipation or moderate constipation.   . [DISCONTINUED] sodium chloride 1 g tablet Take 1 tablet (1 g total) by mouth 2 (two) times daily with a meal. (Patient taking differently: Take 1 g by mouth daily.)   No facility-administered encounter medications on file as of 08/14/2020.

## 2020-08-15 ENCOUNTER — Encounter: Payer: Self-pay | Admitting: Family Medicine

## 2020-08-18 ENCOUNTER — Other Ambulatory Visit: Payer: Self-pay

## 2020-08-18 DIAGNOSIS — E034 Atrophy of thyroid (acquired): Secondary | ICD-10-CM

## 2020-08-18 NOTE — Progress Notes (Signed)
error 

## 2020-08-20 ENCOUNTER — Telehealth: Payer: Self-pay | Admitting: Family Medicine

## 2020-08-20 DIAGNOSIS — E034 Atrophy of thyroid (acquired): Secondary | ICD-10-CM

## 2020-08-20 MED ORDER — LEVOTHYROXINE SODIUM 50 MCG PO TABS: ORAL_TABLET | ORAL | 1 refills | Status: DC

## 2020-08-20 NOTE — Telephone Encounter (Signed)
Pt called in wanted to know about getting a new prescription due to the date expired.   LAST APPOINTMENT DATE: 08/14/2020   NEXT APPOINTMENT DATE:@Visit  date not found  MEDICATION: levothyroxine 50mg   PHARMACY: [facility] [address]   Let patient know to contact pharmacy at the end of the day to make sure medication is ready.  Please notify patient to allow 48-72 hours to process  Encourage patient to contact the pharmacy for refills or they can request refills through [place]:   LAST REFILL:  QTY:  REFILL DATE:    OTHER COMMENTS:    Okay for refill?  Please advise

## 2020-08-20 NOTE — Telephone Encounter (Signed)
RX sent

## 2020-08-26 ENCOUNTER — Ambulatory Visit (HOSPITAL_COMMUNITY)
Admission: RE | Admit: 2020-08-26 | Discharge: 2020-08-26 | Disposition: A | Discharge status: Home / Self Care | Payer: Medicare Other | Source: Ambulatory Visit | Attending: Cardiovascular Disease | Admitting: Cardiovascular Disease

## 2020-08-26 ENCOUNTER — Other Ambulatory Visit: Payer: Self-pay

## 2020-08-26 DIAGNOSIS — R0989 Other specified symptoms and signs involving the circulatory and respiratory systems: Secondary | ICD-10-CM | POA: Diagnosis not present

## 2020-08-26 DIAGNOSIS — I739 Peripheral vascular disease, unspecified: Secondary | ICD-10-CM

## 2020-08-26 DIAGNOSIS — R209 Unspecified disturbances of skin sensation: Secondary | ICD-10-CM

## 2020-08-31 ENCOUNTER — Telehealth: Payer: Self-pay | Admitting: Family Medicine

## 2020-08-31 DIAGNOSIS — R0989 Other specified symptoms and signs involving the circulatory and respiratory systems: Secondary | ICD-10-CM

## 2020-08-31 DIAGNOSIS — R209 Unspecified disturbances of skin sensation: Secondary | ICD-10-CM

## 2020-08-31 DIAGNOSIS — R6889 Other general symptoms and signs: Secondary | ICD-10-CM

## 2020-08-31 NOTE — Telephone Encounter (Signed)
-----   Message from Tonia Ghent, MD sent at 08/27/2020 21:19 AM EDT ----- Certainly, or call me in the meantime if that is better for you.  Thanks.   Brigitte Pulse  ----- Message ----- From: Owens Loffler, MD Sent: 08/26/2020   6:57 PM EDT To: Owens Loffler, MD, Tonia Ghent, MD  Can we talk about this nice patient on Thursday together?

## 2020-08-31 NOTE — Telephone Encounter (Signed)
Can you call her?  He blood blow study was abnormal in the right foot, but it is not terrible.  I talked to Dr. Damita Dunnings, and both of [place] think it would be a good idea to talk about this with one of the vascular surgeons.  I put in a referaal for her.   VAS [place] LOWER EXT ART SEG MULTI (SEGMENTALS & LE RAYNAUDS)  Result Date: 08/28/2020 LOWER EXTREMITY DOPPLER STUDY Indications: Patient states about a year ago she fell on her left leg. Since              then she has felt numbness in the left calf, foot and right foot              intermittently. She states her gait is off and has fallen multiple              times since the first occurrence. She also states both feet feel              cold all the time. She has lost about 30 pounds since August and              has a low appetite. She denies claudication and rest pain symptoms. High Risk Factors: Hyperlipidemia, no history of smoking.  Performing Technologist: Salvadore Dom RVT,RDCS,RDMS  Examination Guidelines: A complete evaluation includes at minimum, Doppler waveform signals and systolic blood pressure reading at the level of bilateral brachial, anterior tibial, and posterior tibial arteries, when vessel segments are accessible. Bilateral testing is considered an integral part of a complete examination. Photoelectric Plethysmograph (PPG) waveforms and toe systolic pressure readings are included as required and additional duplex testing as needed. Limited examinations for reoccurring indications may be performed as noted.  ABI Findings: +---------+------------------+-----+---------+--------+ Right    Rt Pressure (mmHg)IndexWaveform Comment  +---------+------------------+-----+---------+--------+ Brachial 153                                      +---------+------------------+-----+---------+--------+ CFA                             triphasic         +---------+------------------+-----+---------+--------+ Popliteal                        triphasic         +---------+------------------+-----+---------+--------+ ATA      170               1.11 triphasic         +---------+------------------+-----+---------+--------+ PTA      164               1.07 triphasic         +---------+------------------+-----+---------+--------+ PERO     160               1.05 triphasic         +---------+------------------+-----+---------+--------+ Great Toe77                0.50                   +---------+------------------+-----+---------+--------+ +---------+------------------+-----+---------+-------+ Left     Lt Pressure (mmHg)IndexWaveform Comment +---------+------------------+-----+---------+-------+ Brachial 149                                     +---------+------------------+-----+---------+-------+  CFA                             triphasic        +---------+------------------+-----+---------+-------+ Popliteal                       triphasic        +---------+------------------+-----+---------+-------+ ATA      155               1.01 triphasic        +---------+------------------+-----+---------+-------+ PTA      172               1.12 triphasic        +---------+------------------+-----+---------+-------+ PERO     161               1.05 triphasic        +---------+------------------+-----+---------+-------+ Great Toe113               0.74                  +---------+------------------+-----+---------+-------+ +-------+-----------+-----------+------------+------------+ ABI/TBIToday's ABIToday's TBIPrevious ABIPrevious TBI +-------+-----------+-----------+------------+------------+ Right  1.11       .50                                 +-------+-----------+-----------+------------+------------+ Left   1.12       .74                                 +-------+-----------+-----------+------------+------------+   Summary: Right: Resting right ankle-brachial index is within normal  range. No evidence of significant right lower extremity arterial disease. The right toe-brachial index is abnormal. Left: Resting left ankle-brachial index is within normal range. No evidence of significant left lower extremity arterial disease. The left toe-brachial index is normal.  *See table(s) above for measurements and observations.  Electronically signed by Kathlyn Sacramento MD on 08/28/2020 at 11:54:07 AM.    Final

## 2020-09-01 ENCOUNTER — Telehealth: Payer: Self-pay | Admitting: Family Medicine

## 2020-09-01 NOTE — Telephone Encounter (Signed)
Please clarify message.

## 2020-09-01 NOTE — Telephone Encounter (Signed)
Noted again.

## 2020-09-01 NOTE — Telephone Encounter (Signed)
Noted  

## 2020-09-01 NOTE — Telephone Encounter (Signed)
Ms. Orbach notified as instructed by telephone.  She doesn't;t feel like a referral to vascular surgery is necessary but she will think about it and call us back tomorrow with her decision.

## 2020-09-01 NOTE — Addendum Note (Signed)
Addended by: Carter Kitten on: 09/01/2020 03:33 PM   Modules accepted: Orders

## 2020-09-01 NOTE — Telephone Encounter (Signed)
Patient called in wanted to tell Butch Penny that she does not want to be referred to another doctor for her foot.

## 2020-09-01 NOTE — Telephone Encounter (Signed)
She had an abnormal ABI.  I do not think that this is an emergency in any way, but Dr. Damita Dunnings and I both think she should have non-urgent follow-up with vascular surgery to discuss this and her blue feet.

## 2020-09-02 NOTE — Telephone Encounter (Signed)
Noted  

## 2020-09-04 ENCOUNTER — Ambulatory Visit: Payer: Medicare Other

## 2020-12-01 ENCOUNTER — Other Ambulatory Visit: Payer: Self-pay

## 2020-12-01 ENCOUNTER — Ambulatory Visit (INDEPENDENT_AMBULATORY_CARE_PROVIDER_SITE_OTHER): Payer: Medicare Other | Admitting: Family Medicine

## 2020-12-01 ENCOUNTER — Encounter: Payer: Self-pay | Admitting: Family Medicine

## 2020-12-01 VITALS — BP 120/60 | HR 83 | Temp 98.3°F | Ht 66.0 in | Wt 102.0 lb

## 2020-12-01 DIAGNOSIS — D649 Anemia, unspecified: Secondary | ICD-10-CM

## 2020-12-01 DIAGNOSIS — E785 Hyperlipidemia, unspecified: Secondary | ICD-10-CM | POA: Diagnosis not present

## 2020-12-01 DIAGNOSIS — E034 Atrophy of thyroid (acquired): Secondary | ICD-10-CM | POA: Diagnosis not present

## 2020-12-01 DIAGNOSIS — R202 Paresthesia of skin: Secondary | ICD-10-CM | POA: Diagnosis not present

## 2020-12-01 DIAGNOSIS — R634 Abnormal weight loss: Secondary | ICD-10-CM

## 2020-12-01 DIAGNOSIS — W19XXXA Unspecified fall, initial encounter: Secondary | ICD-10-CM

## 2020-12-01 DIAGNOSIS — R413 Other amnesia: Secondary | ICD-10-CM

## 2020-12-01 DIAGNOSIS — M810 Age-related osteoporosis without current pathological fracture: Secondary | ICD-10-CM | POA: Diagnosis not present

## 2020-12-01 DIAGNOSIS — Y92009 Unspecified place in unspecified non-institutional (private) residence as the place of occurrence of the external cause: Secondary | ICD-10-CM

## 2020-12-01 LAB — LIPID PANEL
Cholesterol: 144 mg/dL (ref 0–200)
HDL: 46.9 mg/dL (ref >39.00)
LDL Cholesterol: 72 mg/dL (ref 0–99)
NonHDL: 97.11
Total CHOL/HDL Ratio: 3
Triglycerides: 127 mg/dL (ref 0.0–149.0)
VLDL: 25.4 mg/dL (ref 0.0–40.0)

## 2020-12-01 LAB — CBC WITH DIFFERENTIAL/PLATELET
Basophils Absolute: 0.2 10*3/uL — ABNORMAL HIGH (ref 0.0–0.1)
Basophils Relative: 3.3 % — ABNORMAL HIGH (ref 0.0–3.0)
Eosinophils Absolute: 0.2 10*3/uL (ref 0.0–0.7)
Eosinophils Relative: 2.8 % (ref 0.0–5.0)
HCT: 34.5 % — ABNORMAL LOW (ref 36.0–46.0)
Hemoglobin: 11.7 g/dL — ABNORMAL LOW (ref 12.0–15.0)
Lymphocytes Relative: 27.7 % (ref 12.0–46.0)
Lymphs Abs: 1.8 10*3/uL (ref 0.7–4.0)
MCHC: 34 g/dL (ref 30.0–36.0)
MCV: 95 fl (ref 78.0–100.0)
Monocytes Absolute: 0.6 10*3/uL (ref 0.1–1.0)
Monocytes Relative: 9.1 % (ref 3.0–12.0)
Neutro Abs: 3.7 10*3/uL (ref 1.4–7.7)
Neutrophils Relative %: 57.1 % (ref 43.0–77.0)
Platelets: 219 10*3/uL (ref 150.0–400.0)
RBC: 3.63 Mil/uL — ABNORMAL LOW (ref 3.87–5.11)
RDW: 14 % (ref 11.5–15.5)
WBC: 6.5 10*3/uL (ref 4.0–10.5)

## 2020-12-01 LAB — COMPREHENSIVE METABOLIC PANEL
ALT: 18 U/L (ref 0–35)
AST: 26 U/L (ref 0–37)
Albumin: 4.1 g/dL (ref 3.5–5.2)
Alkaline Phosphatase: 55 U/L (ref 39–117)
BUN: 18 mg/dL (ref 6–23)
CO2: 30 mEq/L (ref 19–32)
Calcium: 9.7 mg/dL (ref 8.4–10.5)
Chloride: 101 mEq/L (ref 96–112)
Creatinine, Ser: 0.78 mg/dL (ref 0.40–1.20)
GFR: 68.33 mL/min (ref >60.00)
Glucose, Bld: 88 mg/dL (ref 70–99)
Potassium: 4.8 mEq/L (ref 3.5–5.1)
Sodium: 137 mEq/L (ref 135–145)
Total Bilirubin: 1.4 mg/dL — ABNORMAL HIGH (ref 0.2–1.2)
Total Protein: 7.3 g/dL (ref 6.0–8.3)

## 2020-12-01 LAB — VITAMIN D 25 HYDROXY (VIT D DEFICIENCY, FRACTURES): VITD: 58.57 ng/mL (ref 30.00–100.00)

## 2020-12-01 LAB — IRON: Iron: 114 ug/dL (ref 42–145)

## 2020-12-01 LAB — VITAMIN B12: Vitamin B-12: 475 pg/mL (ref 211–911)

## 2020-12-01 LAB — TSH: TSH: 1.97 u[IU]/mL (ref 0.35–4.50)

## 2020-12-01 NOTE — Patient Instructions (Addendum)
Your memory testing was not completely normal.  We need to check your labs in the meantime.    Go to the lab on the way out.   If you have mychart we'll likely use that to update you.     Be careful on standing.   We'll call about getting you set up with PT.    Take care.  Glad to see you.

## 2020-12-01 NOTE — Progress Notes (Signed)
This visit occurred during the SARS-CoV-2 public health emergency.  Safety protocols were in place, including screening questions prior to the visit, additional usage of staff PPE, and extensive cleaning of exam room while observing appropriate contact time as indicated for disinfecting solutions.  Recent fall, about 2 weeks ago.  Was out in the yard, pulling weeds.  She was leaning over, unclear if lightheaded, then fell.  Bumped R olecranon, ttp locally prev but not now.  No LOC.  She was able to get up and walk back into the house.  She can get lightheaded on standing.  She has some residual L foot numbness and that may increase her fall risk, d/w pt.    Weight down from prev (up from 99 on her scales).  Stressors d/w pt, esp re: prev caring for her husband prior to his death.    Hypothyroidism.  Still on replacement.  Due for labs.  See notes on labs. Compliant with levothyroxine.    Elevated Cholesterol: Using medications without problems: yes Muscle aches: no Labs pending.   Compliant with lipitor.    Discussed memory changes.  She has more trouble with names, recall of where she placed objects.  She isn't getting lost.  Not leaving the stove on.  She has talked with her family about this.    Meds, vitals, and allergies reviewed.   ROS: Per HPI unless specifically indicated in ROS section   GEN: nad, alert and oriented HEENT: ncat NECK: supple w/o LA CV: rrr.   PULM: ctab, no inc wob ABD: soft, +bs EXT: no edema SKIN: no acute rash Small residual nontender puffiness at the R olecranon.   B leg paresthesia at baseline compared to B arms.    MMSE 25/30 -1 for not knowing the name of the town where the clinic is located, -2 for recall, -2 on attention

## 2020-12-03 DIAGNOSIS — R202 Paresthesia of skin: Secondary | ICD-10-CM | POA: Insufficient documentation

## 2020-12-03 DIAGNOSIS — R634 Abnormal weight loss: Secondary | ICD-10-CM | POA: Insufficient documentation

## 2020-12-03 DIAGNOSIS — Y92009 Unspecified place in unspecified non-institutional (private) residence as the place of occurrence of the external cause: Secondary | ICD-10-CM | POA: Insufficient documentation

## 2020-12-03 DIAGNOSIS — R413 Other amnesia: Secondary | ICD-10-CM | POA: Insufficient documentation

## 2020-12-03 NOTE — Assessment & Plan Note (Signed)
Check TSH.  See notes on labs.  Continue levothyroxine.

## 2020-12-03 NOTE — Assessment & Plan Note (Signed)
Check routine labs given her history of memory changes.  MMSE 25/30.  Routine cautions given to patient.

## 2020-12-03 NOTE — Assessment & Plan Note (Signed)
Continue atorvastatin for now.  See notes on labs.

## 2020-12-03 NOTE — Assessment & Plan Note (Signed)
Likely exacerbated by previous stressor of caring for her husband.  Condolences offered.  See notes on labs.

## 2020-12-03 NOTE — Assessment & Plan Note (Signed)
Refer to PT.  Fall cautions discussed with patient.

## 2020-12-03 NOTE — Assessment & Plan Note (Signed)
This could contribute to her fall risk.  See notes on follow-up labs, especially B12.

## 2020-12-05 ENCOUNTER — Telehealth: Payer: Self-pay | Admitting: Family Medicine

## 2020-12-05 NOTE — Telephone Encounter (Signed)
[name] called in and stated that they received a referral for mrs. Tara Harris and it just says fall in home and not no reason as to why. They are needing more information. Fax number [phone]

## 2020-12-08 IMAGING — DX DG LUMBAR SPINE COMPLETE 4+V
5 series · 5 of 5 positions shown · non-contrast
Comparison: Plain films lumbar spine 10/09/2018.

CLINICAL DATA: Low back pain since a fall yesterday. Initial
encounter.

EXAM:
LUMBAR SPINE - COMPLETE 4+ VIEW

[l-spine ap]
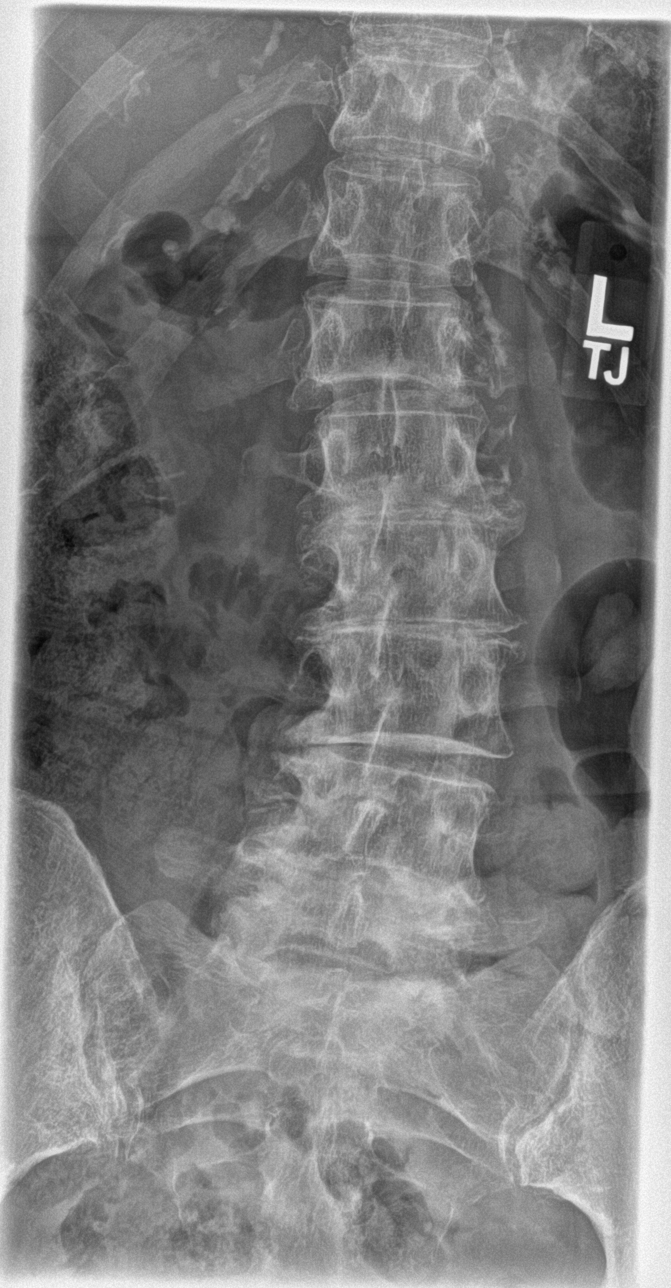

[l-spine obl (1 of 2)]
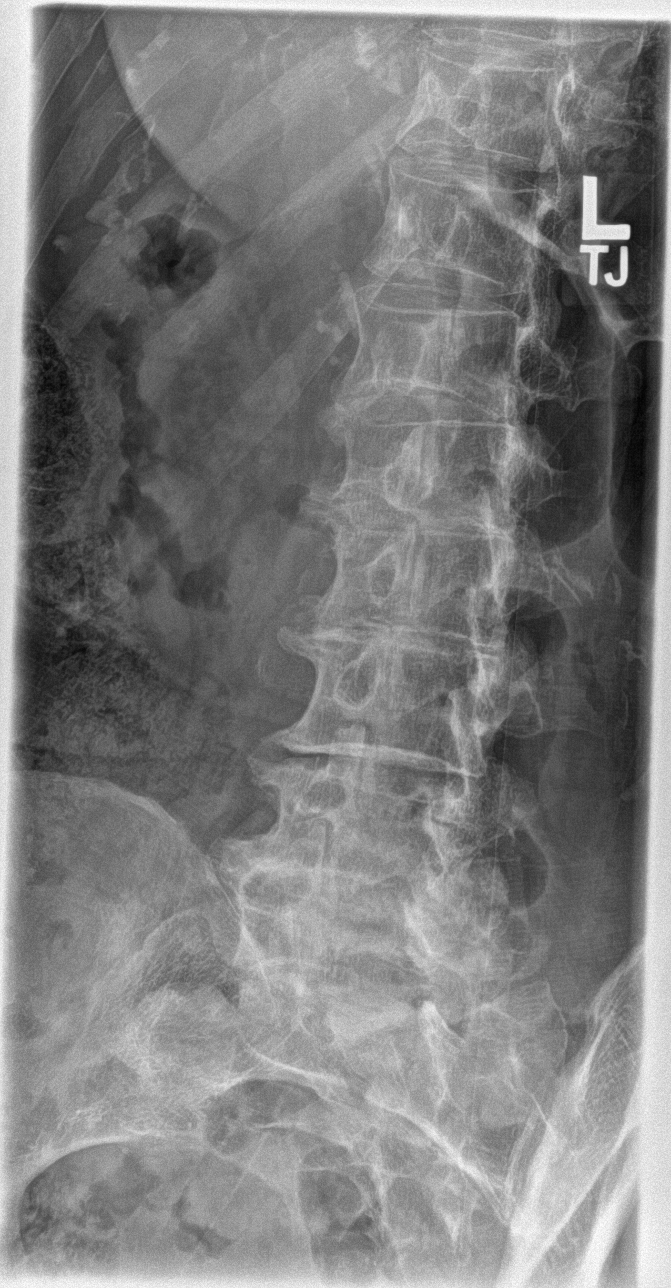

[l-spine obl (2 of 2)]
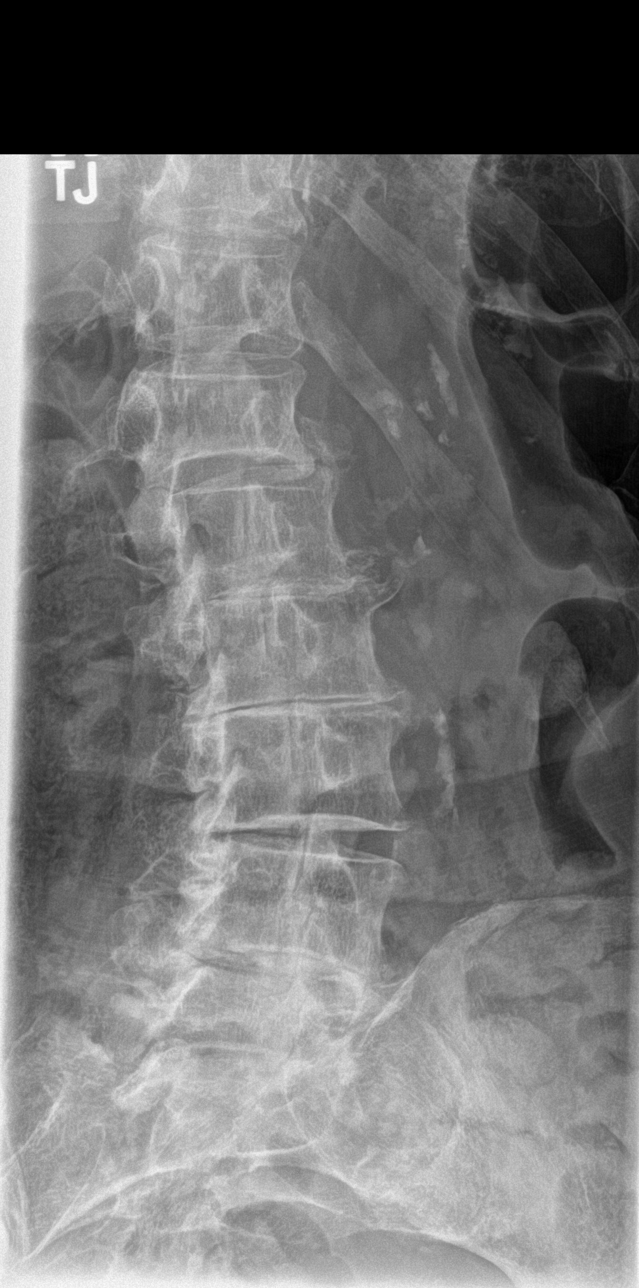

[l-spine lat]
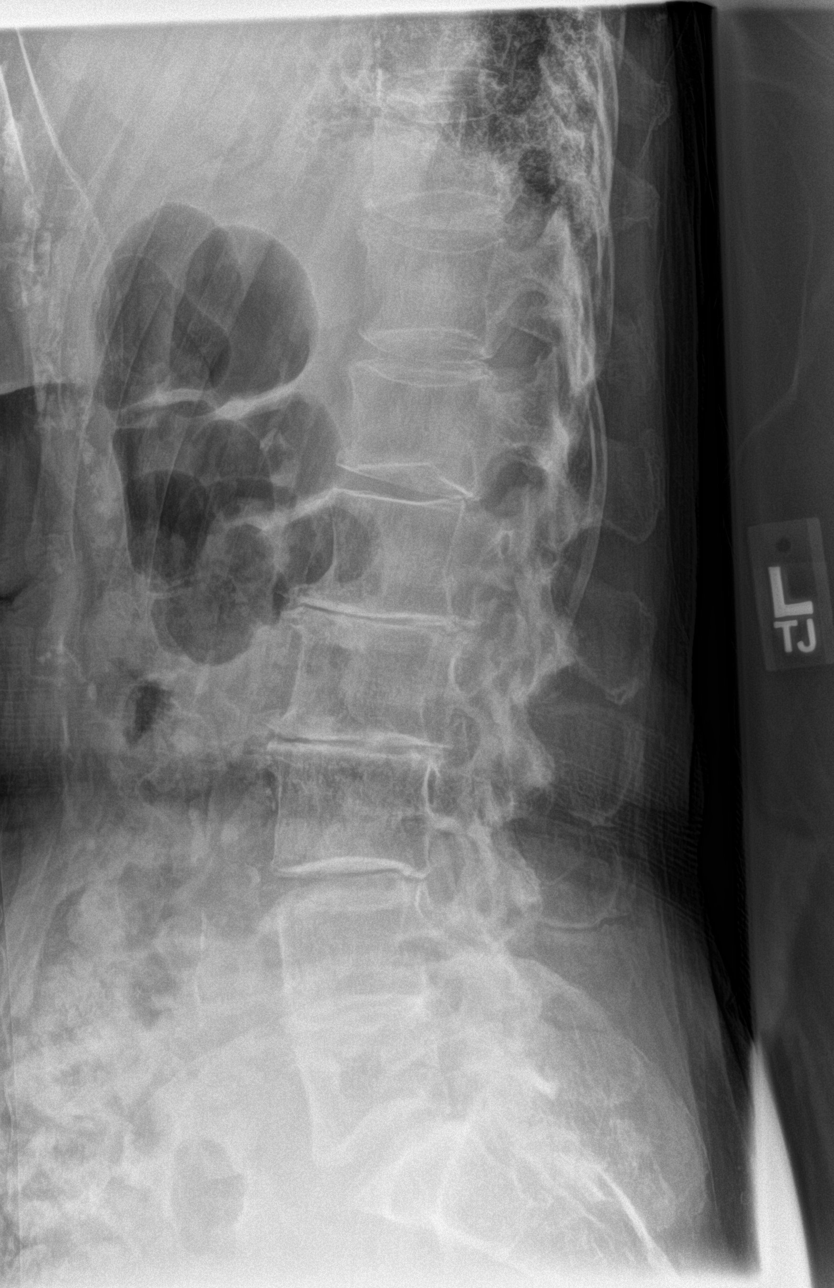

[l-spine l5/s1]
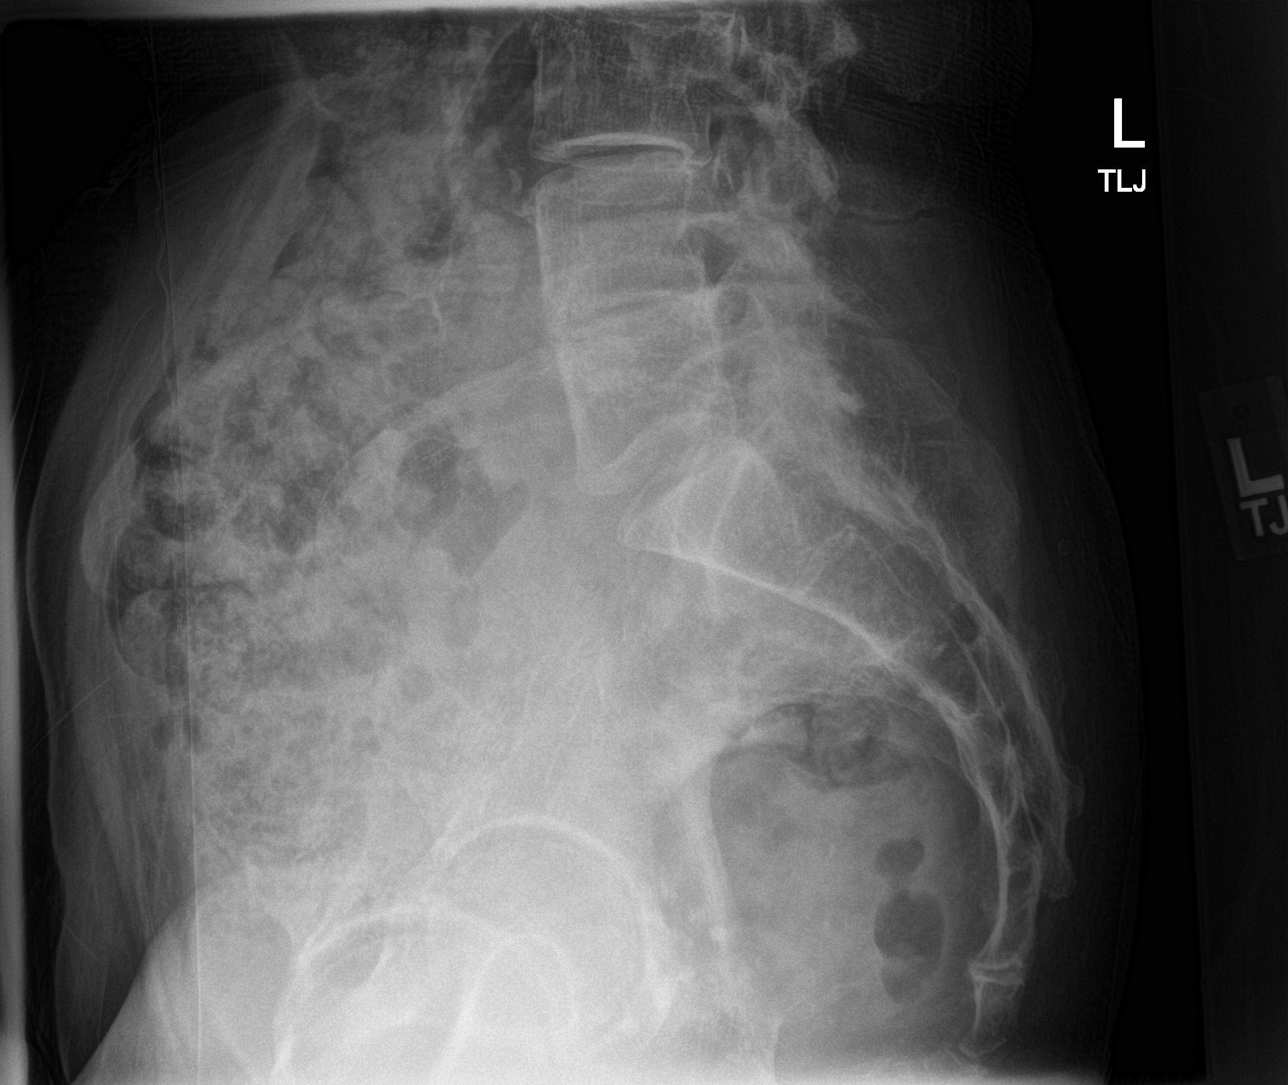

[5 of 5 positions shown; findings below may reference images not displayed]

FINDINGS: No fracture or listhesis. Multilevel degenerative disease and
scoliosis are unchanged. There spinous structures demonstrate
atherosclerotic vascular disease.
IMPRESSION: No acute abnormality.

No change in the appearance of scoliosis and advanced multilevel
degenerative change.

Atherosclerosis.

## 2020-12-08 NOTE — Telephone Encounter (Signed)
She needs physical therapy because she fell.  She fell because she has elevated fall risk.  She has elevated fall risk because of paresthesia.  See if that diagnosis will count, R20.2.

## 2020-12-09 NOTE — Telephone Encounter (Signed)
I called [facility] and discussed fall risk and reason with Becca. OV notes were requested and I have faxed them and this TE note for reference for dx code.

## 2020-12-12 ENCOUNTER — Ambulatory Visit (INDEPENDENT_AMBULATORY_CARE_PROVIDER_SITE_OTHER): Payer: Medicare Other | Admitting: Family Medicine

## 2020-12-12 ENCOUNTER — Other Ambulatory Visit: Payer: Self-pay

## 2020-12-12 ENCOUNTER — Encounter: Payer: Self-pay | Admitting: Family Medicine

## 2020-12-12 DIAGNOSIS — R413 Other amnesia: Secondary | ICD-10-CM

## 2020-12-12 NOTE — Patient Instructions (Signed)
Let me see about setting up memory testing.  I think it makes sense to recheck here in abut 3-4 months.  We'll be in touch.  Take care.  Glad to see you.

## 2020-12-12 NOTE — Progress Notes (Signed)
This visit occurred during the SARS-CoV-2 public health emergency.  Safety protocols were in place, including screening questions prior to the visit, additional usage of staff PPE, and extensive cleaning of exam room while observing appropriate contact time as indicated for disinfecting solutions.  Memory follow up.   Prev imaging d/w pt.   Brain: Generalized atrophy. Normal ventricular morphology. No midline shift or mass effect. Small vessel chronic ischemic changes of deep cerebral white matter. Small focus of high attenuation acute subarachnoid hemorrhage at anterolateral aspect of LEFT cerebellar hemisphere. No additional intracranial hemorrhage, mass lesion, or evidence of acute infarction. No additional extra-axial fluid.  Prev labs d/w pt.   FH Alzheimer's, mother.  Patient and family have noted gradual changes in memory.  Discussed the likely scenario where she had primary memory loss i.e. dementia.  This would be typical vascular dementia with or without Alzheimer's dementia.  She does not have symptoms suggestive of Parkinson's or other conditions.  Discussed options at this point going forward.  She could start medication (i.e. donepezil), see neurology, go for neurocognitive testing.  She could take a combination of the above.  Discussed safety, getting adequate rest, trying to maintain as much memory capacity as possible with reminders and puzzles, etc.  See plan.  PT is going to start soon, hopefully.    She wanted to change her statin to a.m. dosing as that would be easier to manage.  Discussed.  This would be better than not taking it at all.  Meds, vitals, and allergies reviewed.   ROS: Per HPI unless specifically indicated in ROS section   GEN: nad, alert and oriented HEENT: ncat NECK: supple w/o LA CV: rrr. PULM: ctab, no inc wob ABD: soft, +bs EXT: no edema SKIN: Well-perfused.  31 minutes were devoted to patient care in this encounter (this includes time spent  reviewing the patient's file/history, interviewing and examining the patient, counseling/reviewing plan with patient).

## 2020-12-14 MED ORDER — ATORVASTATIN CALCIUM 10 MG PO TABS: ORAL_TABLET | ORAL | 3 refills | Status: DC

## 2020-12-14 NOTE — Assessment & Plan Note (Signed)
See discussion above.  Would continue baseline medications, Lipitor, to decrease the risk of a vascular event.  We will set up neurocognitive testing and plan on recheck in 3 months.  Safety discussed.  Routine cautions given to patient and daughter.  They can update me as needed in the meantime.  Defer starting donepezil for now.

## 2021-04-09 ENCOUNTER — Telehealth: Payer: Self-pay | Admitting: Family Medicine

## 2021-04-09 DIAGNOSIS — E034 Atrophy of thyroid (acquired): Secondary | ICD-10-CM

## 2021-04-09 NOTE — Telephone Encounter (Signed)
Please check with pharmacy/family about getting patient's levothyroxine filled.

## 2021-04-10 MED ORDER — LEVOTHYROXINE SODIUM 50 MCG PO TABS: ORAL_TABLET | ORAL | 1 refills | Status: DC

## 2021-04-10 NOTE — Addendum Note (Signed)
Addended by: Sherrilee Gilles B on: 04/10/2021 04:38 PM   Modules accepted: Orders

## 2021-04-10 NOTE — Telephone Encounter (Signed)
Called and spoke with patient and she wanted rx sent to [facility] [address]. Rx has been sent.

## 2021-07-06 ENCOUNTER — Other Ambulatory Visit: Payer: Self-pay

## 2021-07-06 ENCOUNTER — Ambulatory Visit (INDEPENDENT_AMBULATORY_CARE_PROVIDER_SITE_OTHER): Payer: Medicare Other | Admitting: Nurse Practitioner

## 2021-07-06 ENCOUNTER — Encounter: Payer: Self-pay | Admitting: Nurse Practitioner

## 2021-07-06 ENCOUNTER — Telehealth: Payer: Self-pay

## 2021-07-06 VITALS — BP 120/56 | HR 67 | Temp 98.1°F | Resp 10 | Wt 99.1 lb

## 2021-07-06 DIAGNOSIS — R270 Ataxia, unspecified: Secondary | ICD-10-CM

## 2021-07-06 DIAGNOSIS — I951 Orthostatic hypotension: Secondary | ICD-10-CM

## 2021-07-06 DIAGNOSIS — R278 Other lack of coordination: Secondary | ICD-10-CM | POA: Diagnosis not present

## 2021-07-06 DIAGNOSIS — R42 Dizziness and giddiness: Secondary | ICD-10-CM

## 2021-07-06 DIAGNOSIS — Z23 Encounter for immunization: Secondary | ICD-10-CM

## 2021-07-06 DIAGNOSIS — E034 Atrophy of thyroid (acquired): Secondary | ICD-10-CM | POA: Diagnosis not present

## 2021-07-06 DIAGNOSIS — R269 Unspecified abnormalities of gait and mobility: Secondary | ICD-10-CM | POA: Diagnosis not present

## 2021-07-06 NOTE — Assessment & Plan Note (Signed)
Patient was positive for orthostatic vital changes in office.  Did recommend hydration inclusive with Electrolyte packet at least 1 water for the day.  Did discuss not having position changes to take her time and rest 2 to 3 minutes in each position for getting up and walking

## 2021-07-06 NOTE — Assessment & Plan Note (Signed)
Ataxia on the left upper extremity.  With altered gait no history of CVA per patient report does have history of a subdural hematoma in the past.  Pending stat CT scan of head

## 2021-07-06 NOTE — Telephone Encounter (Signed)
Spoke to patient by telephone and was advised that she has been having dizzy spells off and on for months. Patient stated that she has fallen but has not had any injuries from her falls. Patient denies chest pain, numbness or tingling. Patient stated that she does not have any way of checking her blood pressure but stated that it has always been fine when checked at doctor appointments. Patient stated that she has SOB but only after she has been outside and comes in out of the cold. Patient was advised that she should not drive to her appointment today which she advised that she will call her daughter and get her to bring her. Patient was given the address at [facility]. Patient was given ER/911 precautions and she verbalized understanding.

## 2021-07-06 NOTE — Telephone Encounter (Signed)
PLEASE NOTE: All timestamps contained within this report are represented as [place] Standard Time. CONFIDENTIALTY NOTICE: This fax transmission is intended only for the addressee. It contains information that is legally privileged, confidential or otherwise protected from use or disclosure. If you are not the intended recipient, you are strictly prohibited from reviewing, disclosing, copying using or disseminating any of this information or taking any action in reliance on or regarding this information. If you have received this fax in error, please notify us immediately by telephone so that we can arrange for its return to [place]. Phone: [phone], Toll-Free: [phone], Fax: [phone] Page: 1 of 3 Call Id: [id number] [facility] - Client TELEPHONE ADVICE RECORD [facility] Patient Name: Tara Harris [facility] Gender: Female DOB: 1932-04-11 Age: 88 Y 11 M 11 D Return Phone Number: [phone] (Primary), [phone] (Secondary), [phone] (Alternate) Address: City/ State/ Zip: [place]  [address] Client [facility] - Client Client Site [facility] - Day Provider Renford Dills - MD Contact Type Call Who Is Calling Patient / Member / Family / Caregiver Call Type Triage / Clinical Relationship To Patient Self Return Phone Number [phone] (Secondary) Chief Complaint Walking difficulty Reason for Call Symptomatic / Request for [place] states, patient has been falling frequently. Translation No Nurse Assessment Nurse: Burnadette Peter, RN, Elmo Putt Date/Time (Eastern Time): 07/06/2021 11:36:44 AM Confirm and document reason for call. If symptomatic, describe symptoms. ---Caller states she has been falling frequently. Caller states they aren't big falls. Caller states it happens mostly when she squats or bends over. Caller states she can't get back up unless she falls to get back  up. Caller denies any pain. Caller states she is very unsteady when you walk. Caller states she does not walk with a cane or an assistive device. Caller states she usually just sits down hard sometimes. Caller states she has hit her head several times. Caller states its usually not anything serious, just inconvenient. Caller states she is usually outside. Does the patient have any new or worsening symptoms? ---Yes Will a triage be completed? ---Yes Related visit to physician within the last 2 weeks? ---No Does the PT have any chronic conditions? (i.e. diabetes, asthma, this includes High risk factors for pregnancy, etc.) ---No Is this a behavioral health or substance abuse call? ---No Guidelines Guideline Title Affirmed Question Affirmed Notes Nurse Date/Time (Eastern Time) Dizziness - Lightheadedness [1] Dizziness caused by heat exposure, sudden Wynona Canes 10/07/7739 28:78:67 AM PLEASE NOTE: All timestamps contained within this report are represented as [place] Standard Time. CONFIDENTIALTY NOTICE: This fax transmission is intended only for the addressee. It contains information that is legally privileged, confidential or otherwise protected from use or disclosure. If you are not the intended recipient, you are strictly prohibited from reviewing, disclosing, copying using or disseminating any of this information or taking any action in reliance on or regarding this information. If you have received this fax in error, please notify us immediately by telephone so that we can arrange for its return to [place]. Phone: [phone], Toll-Free: [phone], Fax: [phone] Page: 2 of 3 Call Id: [id number] Guidelines Guideline Title Affirmed Question Affirmed Notes Nurse Date/Time Eilene Ghazi Time) standing, or poor fluid intake AND [2] no improvement after 2 hours of rest and fluids Disp. Time Eilene Ghazi Time) Disposition Final User 07/06/2021 11:57:55 AM See HCP within 4 Hours  (or PCP triage) Yes Burnadette Peter, RN, Laren Everts Disagree/Comply Comply Caller Understands Yes PreDisposition InappropriateToAsk  Care Advice Given Per Guideline SEE HCP (OR PCP TRIAGE) WITHIN 4 HOURS: * IF OFFICE WILL BE OPEN: You need to be seen within the next 3 or 4 hours. Call your doctor (or NP/PA) now or as soon as the office opens. SOURCES OF CARE: * OFFICE: If patient sounds stable and not seriously ill, consult PCP (or follow your office policy) to see if patient can be seen NOW in office. DRINK FLUIDS: * Drink several glasses of fruit juice, other clear fluids or water. * This will improve hydration and blood glucose. * If the weather is hot or you have a fever, make sure the fluids are cold. LIE DOWN AND REST: * Lie down with feet elevated for 1 hour. * This will improve circulation and increase blood flow to the brain. CALL BACK IF: * Passes out (faints) * You become worse CARE ADVICE given per Dizziness (Adult) guideline. Comments User: Braxton Feathers, RN Date/Time Eilene Ghazi Time): 07/06/2021 11:41:52 AM Caller states her left foot has had some problems. Caller states it stays cold to her but when you touch it, it doesn't feel cold. User: Braxton Feathers, RN Date/Time Eilene Ghazi Time): 07/06/2021 11:42:58 AM Caller states her left foot is a little bigger than the other. Caller states she has to buy two different shoe sizes. Caller states this has been going on for a while. Caller states the doctors are aware of this issue. User: Braxton Feathers, RN Date/Time Eilene Ghazi Time): 07/06/2021 11:43:33 AM Caller states if she looks down too long, she gets dizzy. User: Braxton Feathers, RN Date/Time Eilene Ghazi Time): 07/06/2021 11:59:14 AM Caller states she says she is not worried about the unsteadiness issue but her daughter's are concerned so she called to speak with someone regarding the falls and the unsteadiness. User: Braxton Feathers, RN Date/Time Eilene Ghazi Time): 07/06/2021 11:59:43 AM Caller warm  transferred to office to make an appointment for today. Caller will be seen today at 3 pm at the [facility] location. PLEASE NOTE: All timestamps contained within this report are represented as [place] Standard Time. CONFIDENTIALTY NOTICE: This fax transmission is intended only for the addressee. It contains information that is legally privileged, confidential or otherwise protected from use or disclosure. If you are not the intended recipient, you are strictly prohibited from reviewing, disclosing, copying using or disseminating any of this information or taking any action in reliance on or regarding this information. If you have received this fax in error, please notify us immediately by telephone so that we can arrange for its return to [place]. Phone: [phone], Toll-Free: [phone], Fax: [phone] Page: 3 of 3 Call Id: [id number] Referrals REFERRED TO PCP OFFICE

## 2021-07-06 NOTE — Telephone Encounter (Signed)
Please triage patient to make sure she is ok to come in this afternoon and other recommendations not needed. Patient already scheduled to see Matt today at 3 pm. Thank you

## 2021-07-06 NOTE — Patient Instructions (Signed)
Nice to see you today I will be in touch in regards to your labs and the CT results when I get them I recommend getting an electrolyte packet and mixing it with your water at least once through the day Also recommend that you use an aid when you walk to prevent you from falling Follow up if symptoms worsen or do not improve I will let Dr Damita Dunnings know what our plan is so he is in the loop

## 2021-07-06 NOTE — Assessment & Plan Note (Signed)
Patient having difficulty where she wants to lean to the right it seems when she is walking.  Does not use and ambulation aid currently.  Did recommend using ambulatory aid such as cane or walker to prevent recurrent falls especially while at her house.  Patient states she has her mother's walker at home that she can use daughter at bedside and agree.  Pending CT of head

## 2021-07-06 NOTE — Telephone Encounter (Signed)
Noted. Will see her in office

## 2021-07-06 NOTE — Assessment & Plan Note (Signed)
History of the same we will check TSH today

## 2021-07-06 NOTE — Telephone Encounter (Signed)
Patient has an appointment today 07/06/21 with Romilda Garret NP at 3:00 pm at [facility].

## 2021-07-06 NOTE — Progress Notes (Signed)
Acute Office Visit  Subjective:    Patient ID: Tara Harris, female    DOB: 1931-08-27, 88 y.o.   MRN: [id number]  Chief Complaint  Patient presents with   Dizziness    X months/years, worse the last 2 to 3 months .Notices when she is looking down to the floor or gets down on the floor and then stands up she gets dizzy and falls over. She is walking unstable/walks to the side. Has had recurrent falls.     Patient is in today for dizziness  States she has had it for years and over the past few months it has gotten worse. Describes it as feeling off balance.  States noticed that when she stands up in church she gets the feeling of being off balance. States when she is on the ground and stands she get the feeling. When she get up in the morning she can get the feeling but it is not every morning   Fell approx 1 month ago in the house. State approx 2 months ago she did fall and struck her head on the posterior portion but did not pass out.  No history of stroke. Does have a history of a subdural bleed in the past.  Past Medical History:  Diagnosis Date   Arthritis    High cholesterol    Hypothyroidism    IBS (irritable bowel syndrome)    Menopause    Pulmonary fibrosis (HCC)    Thyroid disease    Tubular adenoma 01/13/2007    Past Surgical History:  Procedure Laterality Date   biopsy left leg     BREAST CYST EXCISION Left 06/17/2020   Procedure: LEFT CENTRAL NIPPLE EXCISION;  Surgeon: Donnie Mesa, MD;  Location: [facility];  Service: General;  Laterality: Left;   CATARACT EXTRACTION, BILATERAL  2012   COLONOSCOPY     TONSILLECTOMY      Family History  Problem Relation Age of Onset   Alzheimer's disease Mother    Heart attack Father    Alzheimer's disease Father    Heart attack Sister    Kidney disease Brother    Colon cancer Neg Hx    Stomach cancer Neg Hx    Breast cancer Neg Hx     Social History   Socioeconomic History   Marital status:  Married    Spouse name: Not on file   Number of children: Not on file   Years of education: Not on file   Highest education level: Not on file  Occupational History   Not on file  Tobacco Use   Smoking status: Never   Smokeless tobacco: Never  Vaping Use   Vaping Use: Never used  Substance and Sexual Activity   Alcohol use: No   Drug use: No   Sexual activity: Not on file  Other Topics Concern   Not on file  Social History Narrative   Widowed 2021, husband had dementia.  Was married in 1961.    Do you live in a house, apartment,assistred living, condo, trailer, etc.)? House   Is it one or more stories? One story    How many persons live in your home ? Two   Current or past profession:  Museum/gallery curator, Primary school teacher   Social Determinants of Health   Financial Resource Strain: Not on file  Food Insecurity: Not on file  Transportation Needs: Not on file  Physical Activity: Not on file  Stress: Not on file  Social Connections:  Not on file  Intimate Partner Violence: Not on file    Outpatient Medications Prior to Visit  Medication Sig Dispense Refill   atorvastatin (LIPITOR) 10 MG tablet TAKE 1 TABLET EACH MORNING 90 tablet 3   Cholecalciferol (VITAMIN D) 50 MCG (2000 UT) CAPS Take 1 capsule (2,000 Units total) by mouth daily. 30 capsule 5   levothyroxine (SYNTHROID) 50 MCG tablet TAKE 50 MCG BY MOUTH DAILY ON EMPTY STOMACH. 90 tablet 1   Multiple Vitamins-Minerals (MULTIVITAMIN WITH MINERALS) tablet Take 1 tablet by mouth daily after breakfast.      polyethylene glycol (MIRALAX / GLYCOLAX) packet Take 17 g by mouth daily as needed for mild constipation or moderate constipation.      sodium chloride 1 g tablet Take 1 g by mouth daily.     No facility-administered medications prior to visit.    No Known Allergies  Review of Systems  Constitutional:  Negative for chills and fever.  Respiratory:  Positive for cough (since fall). Negative for shortness of breath (will get  winded when she tries to get her balance).   Cardiovascular:  Negative for chest pain.  Gastrointestinal:  Negative for nausea and vomiting.  Genitourinary:  Negative for difficulty urinating.  Neurological:  Positive for dizziness, light-headedness and numbness (baseline for years in her feet). Negative for syncope, weakness and headaches.      Objective:    Physical Exam Vitals and nursing note reviewed.  Constitutional:      Appearance: Normal appearance.  HENT:     Right Ear: Tympanic membrane, ear canal and external ear normal.     Left Ear: Tympanic membrane, ear canal and external ear normal.     Mouth/Throat:     Mouth: Mucous membranes are moist.     Pharynx: Oropharynx is clear.  Eyes:     General: No visual field deficit.    Extraocular Movements: Extraocular movements intact.     Pupils: Pupils are equal, round, and reactive to light.     Comments: Ears corrective lenses   Neck:     Vascular: No carotid bruit.  Cardiovascular:     Rate and Rhythm: Normal rate and regular rhythm.     Pulses: Normal pulses.  Pulmonary:     Effort: Pulmonary effort is normal.     Breath sounds: Normal breath sounds.  Abdominal:     General: Bowel sounds are normal.  Musculoskeletal:     Right lower leg: No edema.     Left lower leg: No edema.  Lymphadenopathy:     Cervical: No cervical adenopathy.  Skin:    General: Skin is warm.  Neurological:     Mental Status: She is alert.     Cranial Nerves: No dysarthria or facial asymmetry.     Motor: No weakness.     Coordination: Coordination abnormal. Finger-Nose-Finger Test abnormal (left sided ataxia).     Gait: Gait abnormal.     Deep Tendon Reflexes: Reflexes normal.     Reflex Scores:      Bicep reflexes are 2+ on the right side and 2+ on the left side.      Patellar reflexes are 2+ on the right side and 2+ on the left side.    Comments: Bilateral upper and lower extremity strength 5/5    BP (!) 120/56    Pulse 67    Temp  98.1 F (36.7 C)    Resp 10    Wt 99 lb 2 oz (45 kg)  SpO2 98%    BMI 16.00 kg/m  Wt Readings from Last 3 Encounters:  07/06/21 99 lb 2 oz (45 kg)  12/12/20 102 lb (46.3 kg)  12/01/20 102 lb (46.3 kg)    Health Maintenance Due  Topic Date Due   Zoster Vaccines- Shingrix (1 of 2) Never done   INFLUENZA VACCINE  01/05/2021    There are no preventive care reminders to display for this patient.   Lab Results  Component Value Date   TSH 1.97 12/01/2020   Lab Results  Component Value Date   WBC 6.5 12/01/2020   HGB 11.7 (L) 12/01/2020   HCT 34.5 (L) 12/01/2020   MCV 95.0 12/01/2020   PLT 219.0 12/01/2020   Lab Results  Component Value Date   NA 137 12/01/2020   K 4.8 12/01/2020   CO2 30 12/01/2020   GLUCOSE 88 12/01/2020   BUN 18 12/01/2020   CREATININE 0.78 12/01/2020   BILITOT 1.4 (H) 12/01/2020   ALKPHOS 55 12/01/2020   AST 26 12/01/2020   ALT 18 12/01/2020   PROT 7.3 12/01/2020   ALBUMIN 4.1 12/01/2020   CALCIUM 9.7 12/01/2020   ANIONGAP 9 02/18/2020   GFR 68.33 12/01/2020   Lab Results  Component Value Date   CHOL 144 12/01/2020   Lab Results  Component Value Date   HDL 46.90 12/01/2020   Lab Results  Component Value Date   LDLCALC 72 12/01/2020   Lab Results  Component Value Date   TRIG 127.0 12/01/2020   Lab Results  Component Value Date   CHOLHDL 3 12/01/2020   Lab Results  Component Value Date   HGBA1C 5.9 (H) 11/20/2018       Assessment & Plan:   Problem List Items Addressed This Visit       Cardiovascular and Mediastinum   Orthostatic hypotension    Patient was positive for orthostatic vital changes in office.  Did recommend hydration inclusive with Electrolyte packet at least 1 water for the day.  Did discuss not having position changes to take her time and rest 2 to 3 minutes in each position for getting up and walking        Endocrine   Hypothyroidism    History of the same we will check TSH today      Relevant  Orders   TSH     Other   Abnormal gait    Patient having difficulty where she wants to lean to the right it seems when she is walking.  Does not use and ambulation aid currently.  Did recommend using ambulatory aid such as cane or walker to prevent recurrent falls especially while at her house.  Patient states she has her mother's walker at home that she can use daughter at bedside and agree.  Pending CT of head      Relevant Orders   CT HEAD WO CONTRAST (5MM)   Light headedness - Primary   Relevant Orders   CBC   TSH   Comprehensive metabolic panel   Orthostatic vital signs   Abnormal coordination   Relevant Orders   CT HEAD WO CONTRAST (5MM)   Ataxia    Ataxia on the left upper extremity.  With altered gait no history of CVA per patient report does have history of a subdural hematoma in the past.  Pending stat CT scan of head      Relevant Orders   CT HEAD WO CONTRAST (5MM)   Other Visit Diagnoses  Need for immunization against influenza       Relevant Orders   Flu Vaccine QUAD High Dose(Fluad) (Completed)        No orders of the defined types were placed in this encounter.  This visit occurred during the SARS-CoV-2 public health emergency.  Safety protocols were in place, including screening questions prior to the visit, additional usage of staff PPE, and extensive cleaning of exam room while observing appropriate contact time as indicated for disinfecting solutions.    Romilda Garret, NP

## 2021-07-07 ENCOUNTER — Ambulatory Visit
Admission: RE | Admit: 2021-07-07 | Discharge: 2021-07-07 | Disposition: A | Discharge status: Home / Self Care | Payer: Medicare Other | Source: Ambulatory Visit | Attending: Nurse Practitioner | Admitting: Nurse Practitioner

## 2021-07-07 DIAGNOSIS — R270 Ataxia, unspecified: Secondary | ICD-10-CM

## 2021-07-07 DIAGNOSIS — R269 Unspecified abnormalities of gait and mobility: Secondary | ICD-10-CM

## 2021-07-07 DIAGNOSIS — R278 Other lack of coordination: Secondary | ICD-10-CM

## 2021-07-07 LAB — COMPREHENSIVE METABOLIC PANEL
ALT: 13 U/L (ref 0–35)
AST: 19 U/L (ref 0–37)
Albumin: 4 g/dL (ref 3.5–5.2)
Alkaline Phosphatase: 55 U/L (ref 39–117)
BUN: 19 mg/dL (ref 6–23)
CO2: 32 mEq/L (ref 19–32)
Calcium: 9.2 mg/dL (ref 8.4–10.5)
Chloride: 100 mEq/L (ref 96–112)
Creatinine, Ser: 0.74 mg/dL (ref 0.40–1.20)
GFR: 72.48 mL/min (ref >60.00)
Glucose, Bld: 125 mg/dL — ABNORMAL HIGH (ref 70–99)
Potassium: 3.9 mEq/L (ref 3.5–5.1)
Sodium: 136 mEq/L (ref 135–145)
Total Bilirubin: 1 mg/dL (ref 0.2–1.2)
Total Protein: 7.4 g/dL (ref 6.0–8.3)

## 2021-07-07 LAB — CBC
HCT: 33.8 % — ABNORMAL LOW (ref 36.0–46.0)
Hemoglobin: 11.4 g/dL — ABNORMAL LOW (ref 12.0–15.0)
MCHC: 33.8 g/dL (ref 30.0–36.0)
MCV: 95.4 fl (ref 78.0–100.0)
Platelets: 244 10*3/uL (ref 150.0–400.0)
RBC: 3.54 Mil/uL — ABNORMAL LOW (ref 3.87–5.11)
RDW: 13.6 % (ref 11.5–15.5)
WBC: 7.4 10*3/uL (ref 4.0–10.5)

## 2021-07-07 LAB — TSH: TSH: 2.48 u[IU]/mL (ref 0.35–5.50)

## 2021-08-13 NOTE — Progress Notes (Signed)
Subjective:   Tara Harris is a 88 y.o. female who presents for Medicare Annual (Subsequent) preventive examination.  I connected with Tara Harris today by telephone and verified that I am speaking with the correct person using two identifiers. Location patient: home Location provider: work Persons participating in the virtual visit: patient, Marine scientist.    I discussed the limitations, risks, security and privacy concerns of performing an evaluation and management service by telephone and the availability of in person appointments. I also discussed with the patient that there may be a patient responsible charge related to this service. The patient expressed understanding and verbally consented to this telephonic visit.    Interactive audio and video telecommunications were attempted between this provider and patient, however failed, due to patient having technical difficulties OR patient did not have access to video capability.  We continued and completed visit with audio only.  Some vital signs may be absent or patient reported.   Time Spent with patient on telephone encounter: 20 minutes  Review of Systems     Cardiac Risk Factors include: advanced age (>15mn, >>14women);dyslipidemia     Objective:    Today's Vitals   08/14/21 0856  Weight: 99 lb (44.9 kg)  Height: '5\' 6"'$  (1.676 m)   Body mass index is 15.98 kg/m.  Advanced Directives 08/14/2021 06/17/2020 06/10/2020 01/24/2020 12/14/2019 12/07/2019 09/27/2018  Does Patient Have a Medical Advance Directive? Yes Yes Yes Yes Yes Yes Yes  Type of AParamedicof AGranite CityLiving will [place]os LucerosLiving will - Living will;Healthcare Power of [place] Healthcare Power of [place] Does patient want to make changes to medical advance directive? Yes (MAU/Ambulatory/Procedural Areas - Information given) No - Patient declined No -  Patient declined No - Patient declined - Yes (ED - Information included in AVS) No - Patient declined  Copy of [place] Chart? Yes - validated most recent copy scanned in chart (See row information) No - copy requested No - copy requested No - copy requested - - Yes - validated most recent copy scanned in chart (See row information)  Would patient like information on creating a medical advance directive? - - - No - Patient declined - - -    Current Medications (verified) Outpatient Encounter Medications as of 08/14/2021  Medication Sig   atorvastatin (LIPITOR) 10 MG tablet TAKE 1 TABLET EACH MORNING   Cholecalciferol (VITAMIN D) 50 MCG (2000 UT) CAPS Take 1 capsule (2,000 Units total) by mouth daily.   levothyroxine (SYNTHROID) 50 MCG tablet TAKE 50 MCG BY MOUTH DAILY ON EMPTY STOMACH.   Multiple Vitamins-Minerals (MULTIVITAMIN WITH MINERALS) tablet Take 1 tablet by mouth daily after breakfast.    polyethylene glycol (MIRALAX / GLYCOLAX) packet Take 17 g by mouth daily as needed for mild constipation or moderate constipation.    sodium chloride 1 g tablet Take 1 g by mouth daily.   No facility-administered encounter medications on file as of 08/14/2021.    Allergies (verified) Patient has no known allergies.   History: Past Medical History:  Diagnosis Date   Arthritis    High cholesterol    Hypothyroidism    IBS (irritable bowel syndrome)    Menopause    Pulmonary fibrosis (HCC)    Thyroid disease    Tubular adenoma 01/13/2007   Past Surgical History:  Procedure Laterality Date   biopsy left leg     BREAST  CYST EXCISION Left 06/17/2020   Procedure: LEFT CENTRAL NIPPLE EXCISION;  Surgeon: Donnie Mesa, MD;  Location: [facility];  Service: General;  Laterality: Left;   CATARACT EXTRACTION, BILATERAL  2012   COLONOSCOPY     TONSILLECTOMY     Family History  Problem Relation Age of Onset   Alzheimer's disease Mother    Heart attack Father     Alzheimer's disease Father    Heart attack Sister    Kidney disease Brother    Colon cancer Neg Hx    Stomach cancer Neg Hx    Breast cancer Neg Hx    Social History   Socioeconomic History   Marital status: Widowed    Spouse name: Not on file   Number of children: Not on file   Years of education: Not on file   Highest education level: Not on file  Occupational History   Not on file  Tobacco Use   Smoking status: Never   Smokeless tobacco: Never  Vaping Use   Vaping Use: Never used  Substance and Sexual Activity   Alcohol use: No   Drug use: No   Sexual activity: Not on file  Other Topics Concern   Not on file  Social History Narrative   Widowed 2021, husband had dementia.  Was married in 1961.    Do you live in a house, apartment,assistred living, condo, trailer, etc.)? House   Is it one or more stories? One story    How many persons live in your home ? Two   Current or past profession:  Museum/gallery curator, Primary school teacher   Social Determinants of Health   Financial Resource Strain: Low Risk    Difficulty of Paying Living Expenses: Not hard at all  Food Insecurity: No Food Insecurity   Worried About Charity fundraiser in the Last Year: Never true   Arboriculturist in the Last Year: Never true  Transportation Needs: No Transportation Needs   Lack of Transportation (Medical): No   Lack of Transportation (Non-Medical): No  Physical Activity: Inactive   Days of Exercise per Week: 0 days   Minutes of Exercise per Session: 0 min  Stress: No Stress Concern Present   Feeling of Stress : Only a little  Social Connections: Moderately Integrated   Frequency of Communication with Friends and Family: More than three times a week   Frequency of Social Gatherings with Friends and Family: Three times a week   Attends Religious Services: More than 4 times per year   Active Member of Clubs or Organizations: Yes   Attends Archivist Meetings: More than 4 times per year    Marital Status: Widowed    Tobacco Counseling Counseling given: Not Answered   Clinical Intake:  Pre-visit preparation completed: Yes  Pain : No/denies pain     BMI - recorded: 15.98 Nutritional Status: BMI <19  Underweight Nutritional Risks: None Diabetes: No  How often do you need to have someone help you when you read instructions, pamphlets, or other written materials from your doctor or pharmacy?: 1 - Never  Diabetic? No  Interpreter Needed?: No  Information entered by :: Orrin Brigham LPN   Activities of Daily Living In your present state of health, do you have any difficulty performing the following activities: 08/14/2021  Hearing? N  Vision? N  Difficulty concentrating or making decisions? Y  Walking or climbing stairs? N  Dressing or bathing? N  Doing errands, shopping? N  Preparing Food and eating ? N  Using the Toilet? N  In the past six months, have you accidently leaked urine? N  Do you have problems with loss of bowel control? N  Managing your Medications? N  Managing your Finances? N  Housekeeping or managing your Housekeeping? N  Some recent data might be hidden    Patient Care Team: Tonia Ghent, MD as PCP - General (Family Medicine) Rutherford Guys, MD as Consulting Physician (Ophthalmology) Druscilla Brownie, MD as Consulting Physician (Dermatology)  Indicate any recent Medical Services you may have received from other than [facility] providers in the past year (date may be approximate).     Assessment:   This is a routine wellness examination for Tara Harris.  Hearing/Vision screen No results found.  Dietary issues and exercise activities discussed: Current Exercise Habits: The patient does not participate in regular exercise at present (works in the yard and house cleaning)   Goals Addressed             This Visit's Progress    Patient Stated       Would like to maintain current routine        Depression Screen PHQ 2/9  Scores 08/14/2021 12/01/2020 01/01/2019 05/22/2018 03/29/2018 07/11/2017 05/26/2017  PHQ - 2 Score 0 0 0 0 0 0 0    Fall Risk Fall Risk  08/14/2021 12/01/2020 01/01/2019 09/27/2018 08/14/2018  Falls in the past year? 1 1 0 0 0  Number falls in past yr: 0 1 0 0 0  Injury with Fall? 0 1 0 0 0  Comment - - - - -  Risk for fall due to : Other (Comment) History of fall(s);Impaired balance/gait - - -  Risk for fall due to: Comment tripped in yard - - - -  Follow up Falls prevention discussed Falls evaluation completed - - -    FALL RISK PREVENTION PERTAINING TO THE HOME:  Any stairs in or around the home? Yes  If so, are there any without handrails? No  Home free of loose throw rugs in walkways, pet beds, electrical cords, etc? Yes  Adequate lighting in your home to reduce risk of falls? Yes   ASSISTIVE DEVICES UTILIZED TO PREVENT FALLS:  Life alert? No  Use of a cane, walker or w/c? Yes , cane on occasion  Grab bars in the bathroom? Yes  Shower chair or bench in shower? No  Elevated toilet seat or a handicapped toilet? No   TIMED UP AND GO:  Was the test performed? No .    Cognitive Function: Normal cognitive status assessed by this Nurse Health Advisor. No abnormalities found.   MMSE - Mini Mental State Exam 03/29/2018 10/20/2016 07/03/2015  Not completed: - - (No Data)  Orientation to time '5 5 5  '$ Orientation to Place '5 5 5  '$ Registration '3 3 3  '$ Attention/ Calculation '5 5 4  '$ Recall '2 1 3  '$ Language- name 2 objects '2 2 2  '$ Language- repeat '1 1 1  '$ Language- follow 3 step command '3 3 3  '$ Language- read & follow direction '1 1 1  '$ Write a sentence '1 1 1  '$ Copy design 1 1 0  Total score '29 28 28     '$ 6CIT Screen 08/14/2021  What Year? 0 points  What month? 0 points  What time? 0 points  Count back from 20 0 points  Months in reverse 4 points  Repeat phrase 0 points  Total Score 4  Immunizations Immunization History  Administered Date(s) Administered   Fluad Quad(high Dose 65+)  02/22/2019, 07/06/2021   Influenza Split 03/13/2012   Influenza Whole 03/17/2007, 02/26/2013   Influenza, High Dose Seasonal PF 02/05/2014, 03/03/2016, 03/15/2017, 02/14/2018   Influenza,inj,Quad PF,6+ Mos 02/20/2015   Influenza-Unspecified 02/05/2018   Pneumococcal Conjugate-13 05/21/2014   Pneumococcal Polysaccharide-23 02/06/2007   Td 06/07/1998, 12/26/2008, 02/22/2019   Zoster, Live 02/06/2007    TDAP status: Up to date  Flu Vaccine status: Up to date  Pneumococcal vaccine status: Up to date  Covid-19 vaccine status: Information provided on how to obtain vaccines.   Qualifies for Shingles Vaccine? Yes   Zostavax completed Yes   Shingrix Completed?: No.    Education has been provided regarding the importance of this vaccine. Patient has been advised to call insurance company to determine out of pocket expense if they have not yet received this vaccine. Advised may also receive vaccine at local pharmacy or Health Dept. Verbalized acceptance and understanding.  Screening Tests Health Maintenance  Topic Date Due   COVID-19 Vaccine (1) Never done   Zoster Vaccines- Shingrix (1 of 2) Never done   TETANUS/TDAP  02/21/2029   Pneumonia Vaccine 58+ Years old  Completed   INFLUENZA VACCINE  Completed   DEXA SCAN  Completed   HPV VACCINES  Aged Out   MAMMOGRAM  Discontinued    Health Maintenance  Health Maintenance Due  Topic Date Due   COVID-19 Vaccine (1) Never done   Zoster Vaccines- Shingrix (1 of 2) Never done    Colorectal cancer screening: No longer required.   Mammogram status: No longer required due to age.  Bone Density status: No longer required   Lung Cancer Screening: (Low Dose CT Chest recommended if Age 37-80 years, 30 pack-year currently smoking OR have quit w/in 15years.) does not qualify.     Additional Screening:  Hepatitis C Screening: does not qualify  Vision Screening: Recommended annual ophthalmology exams for early detection of glaucoma and  other disorders of the eye. Is the patient up to date with their annual eye exam?  No  Who is the provider or what is the name of the office in which the patient attends annual eye exams? Provider information unavailable    Dental Screening: Recommended annual dental exams for proper oral hygiene  Community Resource Referral / Chronic Care Management: CRR required this visit?  No   CCM required this visit?  No      Plan:     I have personally reviewed and noted the following in the patients chart:   Medical and social history Use of alcohol, tobacco or illicit drugs  Current medications and supplements including opioid prescriptions.  Functional ability and status Nutritional status Physical activity Advanced directives List of other physicians Hospitalizations, surgeries, and ER visits in previous 12 months Vitals Screenings to include cognitive, depression, and falls Referrals and appointments  In addition, I have reviewed and discussed with patient certain preventive protocols, quality metrics, and best practice recommendations. A written personalized care plan for preventive services as well as general preventive health recommendations were provided to patient.   Due to this being a telephonic visit, the after visit summary with patients personalized plan was offered to patient via mail or my-chart.  Patient would like to access on my-chart.     Loma Messing, LPN   12/13/6281   Nurse Health Advisor  Nurse Notes: none

## 2021-08-14 ENCOUNTER — Ambulatory Visit (INDEPENDENT_AMBULATORY_CARE_PROVIDER_SITE_OTHER): Payer: Medicare Other

## 2021-08-14 VITALS — Ht 66.0 in | Wt 99.0 lb

## 2021-08-14 DIAGNOSIS — Z0000 Encounter for general adult medical examination without abnormal findings: Secondary | ICD-10-CM | POA: Diagnosis not present

## 2021-08-14 NOTE — Patient Instructions (Signed)
[name] , Thank you for taking time to complete your Medicare Wellness Visit. I appreciate your ongoing commitment to your health goals. Please review the following plan we discussed and let me know if I can assist you in the future.   Screening recommendations/referrals: Colonoscopy: no longer required  Mammogram: per our conversation , you no longer complete this screening  Bone Density: no longer required  Recommended yearly ophthalmology/optometry visit for glaucoma screening and checkup Recommended yearly dental visit for hygiene and checkup  Vaccinations: Influenza vaccine: up to date  Pneumococcal vaccine: up to date  Tdap vaccine: up to date, due 02/21/29 Shingles vaccine: Discuss with pharmacy   Covid-19:newest booster available at your local pharmacy   Advanced directives: copy on file   Conditions/risks identified: see problem list   Next appointment: Follow up in one year for your annual wellness visit    Preventive Care 65 Years and Older, Female Preventive care refers to lifestyle choices and visits with your health care provider that can promote health and wellness. What does preventive care include? A yearly physical exam. This is also called an annual well check. Dental exams once or twice a year. Routine eye exams. Ask your health care provider how often you should have your eyes checked. Personal lifestyle choices, including: Daily care of your teeth and gums. Regular physical activity. Eating a healthy diet. Avoiding tobacco and drug use. Limiting alcohol use. Practicing safe sex. Taking low-dose aspirin every day. Taking vitamin and mineral supplements as recommended by your health care provider. What happens during an annual well check? The services and screenings done by your health care provider during your annual well check will depend on your age, overall health, lifestyle risk factors, and family history of disease. Counseling  Your health care  provider may ask you questions about your: Alcohol use. Tobacco use. Drug use. Emotional well-being. Home and relationship well-being. Sexual activity. Eating habits. History of falls. Memory and ability to understand (cognition). Work and work Statistician. Reproductive health. Screening  You may have the following tests or measurements: Height, weight, and BMI. Blood pressure. Lipid and cholesterol levels. These may be checked every 5 years, or more frequently if you are over 62 years old. Skin check. Lung cancer screening. You may have this screening every year starting at age 22 if you have a 30-pack-year history of smoking and currently smoke or have quit within the past 15 years. Fecal occult blood test (FOBT) of the stool. You may have this test every year starting at age 22. Flexible sigmoidoscopy or colonoscopy. You may have a sigmoidoscopy every 5 years or a colonoscopy every 10 years starting at age 73. Hepatitis C blood test. Hepatitis B blood test. Sexually transmitted disease (STD) testing. Diabetes screening. This is done by checking your blood sugar (glucose) after you have not eaten for a while (fasting). You may have this done every 1-3 years. Bone density scan. This is done to screen for osteoporosis. You may have this done starting at age 53. Mammogram. This may be done every 1-2 years. Talk to your health care provider about how often you should have regular mammograms. Talk with your health care provider about your test results, treatment options, and if necessary, the need for more tests. Vaccines  Your health care provider may recommend certain vaccines, such as: Influenza vaccine. This is recommended every year. Tetanus, diphtheria, and acellular pertussis (Tdap, Td) vaccine. You may need a Td booster every 10 years. Zoster vaccine. You may  need this after age 34. Pneumococcal 13-valent conjugate (PCV13) vaccine. One dose is recommended after age  58. Pneumococcal polysaccharide (PPSV23) vaccine. One dose is recommended after age 64. Talk to your health care provider about which screenings and vaccines you need and how often you need them. This information is not intended to replace advice given to you by your health care provider. Make sure you discuss any questions you have with your health care provider. Document Released: 06/20/2015 Document Revised: 02/11/2016 Document Reviewed: 03/25/2015 Elsevier Interactive Patient Education  2017 [employer] Prevention in the Home Falls can cause injuries. They can happen to people of all ages. There are many things you can do to make your home safe and to help prevent falls. What can I do on the outside of my home? Regularly fix the edges of walkways and driveways and fix any cracks. Remove anything that might make you trip as you walk through a door, such as a raised step or threshold. Trim any bushes or trees on the path to your home. Use bright outdoor lighting. Clear any walking paths of anything that might make someone trip, such as rocks or tools. Regularly check to see if handrails are loose or broken. Make sure that both sides of any steps have handrails. Any raised decks and porches should have guardrails on the edges. Have any leaves, snow, or ice cleared regularly. Use sand or salt on walking paths during winter. Clean up any spills in your garage right away. This includes oil or grease spills. What can I do in the bathroom? Use night lights. Install grab bars by the toilet and in the tub and shower. Do not use towel bars as grab bars. Use non-skid mats or decals in the tub or shower. If you need to sit down in the shower, use a plastic, non-slip stool. Keep the floor dry. Clean up any water that spills on the floor as soon as it happens. Remove soap buildup in the tub or shower regularly. Attach bath mats securely with double-sided non-slip rug tape. Do not have throw  rugs and other things on the floor that can make you trip. What can I do in the bedroom? Use night lights. Make sure that you have a light by your bed that is easy to reach. Do not use any sheets or blankets that are too big for your bed. They should not hang down onto the floor. Have a firm chair that has side arms. You can use this for support while you get dressed. Do not have throw rugs and other things on the floor that can make you trip. What can I do in the kitchen? Clean up any spills right away. Avoid walking on wet floors. Keep items that you use a lot in easy-to-reach places. If you need to reach something above you, use a strong step stool that has a grab bar. Keep electrical cords out of the way. Do not use floor polish or wax that makes floors slippery. If you must use wax, use non-skid floor wax. Do not have throw rugs and other things on the floor that can make you trip. What can I do with my stairs? Do not leave any items on the stairs. Make sure that there are handrails on both sides of the stairs and use them. Fix handrails that are broken or loose. Make sure that handrails are as long as the stairways. Check any carpeting to make sure that it is firmly attached  to the stairs. Fix any carpet that is loose or worn. Avoid having throw rugs at the top or bottom of the stairs. If you do have throw rugs, attach them to the floor with carpet tape. Make sure that you have a light switch at the top of the stairs and the bottom of the stairs. If you do not have them, ask someone to add them for you. What else can I do to help prevent falls? Wear shoes that: Do not have high heels. Have rubber bottoms. Are comfortable and fit you well. Are closed at the toe. Do not wear sandals. If you use a stepladder: Make sure that it is fully opened. Do not climb a closed stepladder. Make sure that both sides of the stepladder are locked into place. Ask someone to hold it for you, if  possible. Clearly mark and make sure that you can see: Any grab bars or handrails. First and last steps. Where the edge of each step is. Use tools that help you move around (mobility aids) if they are needed. These include: Canes. Walkers. Scooters. Crutches. Turn on the lights when you go into a dark area. Replace any light bulbs as soon as they burn out. Set up your furniture so you have a clear path. Avoid moving your furniture around. If any of your floors are uneven, fix them. If there are any pets around you, be aware of where they are. Review your medicines with your doctor. Some medicines can make you feel dizzy. This can increase your chance of falling. Ask your doctor what other things that you can do to help prevent falls. This information is not intended to replace advice given to you by your health care provider. Make sure you discuss any questions you have with your health care provider. Document Released: 03/20/2009 Document Revised: 10/30/2015 Document Reviewed: 06/28/2014 Elsevier Interactive Patient Education  2017 [employer].

## 2021-10-09 ENCOUNTER — Ambulatory Visit (INDEPENDENT_AMBULATORY_CARE_PROVIDER_SITE_OTHER): Payer: Medicare Other | Admitting: Family Medicine

## 2021-10-09 ENCOUNTER — Encounter: Payer: Self-pay | Admitting: Family Medicine

## 2021-10-09 ENCOUNTER — Ambulatory Visit (INDEPENDENT_AMBULATORY_CARE_PROVIDER_SITE_OTHER)
Admission: RE | Admit: 2021-10-09 | Discharge: 2021-10-09 | Disposition: A | Discharge status: Home / Self Care | Payer: Medicare Other | Source: Ambulatory Visit | Attending: Family Medicine | Admitting: Family Medicine

## 2021-10-09 VITALS — BP 90/60 | HR 73 | Temp 98.0°F | Ht 66.0 in | Wt 94.5 lb

## 2021-10-09 DIAGNOSIS — R059 Cough, unspecified: Secondary | ICD-10-CM

## 2021-10-09 DIAGNOSIS — R634 Abnormal weight loss: Secondary | ICD-10-CM | POA: Diagnosis not present

## 2021-10-09 DIAGNOSIS — I951 Orthostatic hypotension: Secondary | ICD-10-CM | POA: Diagnosis not present

## 2021-10-09 DIAGNOSIS — R9389 Abnormal findings on diagnostic imaging of other specified body structures: Secondary | ICD-10-CM

## 2021-10-09 LAB — COMPREHENSIVE METABOLIC PANEL
ALT: 10 U/L (ref 0–35)
AST: 18 U/L (ref 0–37)
Albumin: 3.9 g/dL (ref 3.5–5.2)
Alkaline Phosphatase: 47 U/L (ref 39–117)
BUN: 19 mg/dL (ref 6–23)
CO2: 30 mEq/L (ref 19–32)
Calcium: 9.4 mg/dL (ref 8.4–10.5)
Chloride: 100 mEq/L (ref 96–112)
Creatinine, Ser: 0.73 mg/dL (ref 0.40–1.20)
GFR: 73.54 mL/min (ref >60.00)
Glucose, Bld: 85 mg/dL (ref 70–99)
Potassium: 4.3 mEq/L (ref 3.5–5.1)
Sodium: 138 mEq/L (ref 135–145)
Total Bilirubin: 1.2 mg/dL (ref 0.2–1.2)
Total Protein: 7.5 g/dL (ref 6.0–8.3)

## 2021-10-09 LAB — CBC WITH DIFFERENTIAL/PLATELET
Basophils Absolute: 0.1 10*3/uL (ref 0.0–0.1)
Basophils Relative: 1 % (ref 0.0–3.0)
Eosinophils Absolute: 0.2 10*3/uL (ref 0.0–0.7)
Eosinophils Relative: 2.9 % (ref 0.0–5.0)
HCT: 33.9 % — ABNORMAL LOW (ref 36.0–46.0)
Hemoglobin: 11.6 g/dL — ABNORMAL LOW (ref 12.0–15.0)
Lymphocytes Relative: 19.3 % (ref 12.0–46.0)
Lymphs Abs: 1.1 10*3/uL (ref 0.7–4.0)
MCHC: 34.2 g/dL (ref 30.0–36.0)
MCV: 95.2 fl (ref 78.0–100.0)
Monocytes Absolute: 0.5 10*3/uL (ref 0.1–1.0)
Monocytes Relative: 9.6 % (ref 3.0–12.0)
Neutro Abs: 3.7 10*3/uL (ref 1.4–7.7)
Neutrophils Relative %: 67.2 % (ref 43.0–77.0)
Platelets: 233 10*3/uL (ref 150.0–400.0)
RBC: 3.56 Mil/uL — ABNORMAL LOW (ref 3.87–5.11)
RDW: 13.4 % (ref 11.5–15.5)
WBC: 5.5 10*3/uL (ref 4.0–10.5)

## 2021-10-09 LAB — TSH: TSH: 2.76 u[IU]/mL (ref 0.35–5.50)

## 2021-10-09 MED ORDER — MIRTAZAPINE 7.5 MG PO TABS: 7.5000 mg | ORAL_TABLET | Freq: Every day | ORAL | 1 refills | Status: DC

## 2021-10-09 NOTE — Progress Notes (Signed)
Weight loss.   More anxious.  Scored 19 on GAD 7.  Exacerbated by loss of her husband, grief.  D/w pt.  She misses him.  She has some insomnia, trouble getting to sleep, staying asleep.  "I've never been a big eater" at baseline.  Lack of appetite noted.  Has been taking fluids.   ? ?She has been worried about memory changes, getting more anxious about that.   ? ?Lower BP noted.  She is off NaCl tabs.   ? ?Cough.  She thought it was due to allergies.  Dry cough.  Worse with weather changes, temperature changes.  Comes in fits.   Prev pulmonary eval d/w pt.   ? ?Meds, vitals, and allergies reviewed.  ? ?ROS: Per HPI unless specifically indicated in ROS section  ? ?Nad, thin appearing elderly female ?Ncat ?Neck supple, no LA ?Rrr ?Ctab except for faint crackles noted on exam.  No focal decrease in breath sounds ?Abdomen soft.  Nontender. ?Extremities well perfused.  No edema. ?

## 2021-10-09 NOTE — Patient Instructions (Addendum)
Labs and xray today.   ?I would restart salt tabs and start taking mirtazapine.   ?Take care.  Glad to see you. ?Let me check with pulmonary in the meantime about your cough.   ?

## 2021-10-11 NOTE — Assessment & Plan Note (Signed)
History of, advised to restart sodium tablets.  See notes on labs. ?

## 2021-10-11 NOTE — Assessment & Plan Note (Signed)
Discussed that this could be grief related.  She is also has a history of abnormal chest x-ray with crackles on exam.  Recheck chest x-ray today.  Check routine labs.  I will update pulmonary in the meantime.  Discussed that it would be reasonable to use mirtazapine in the situation to try to stimulate her appetite, help with sleep and help her mood.  She agrees with plan.  Has family support.  Still okay for outpatient follow-up. ?

## 2021-10-11 NOTE — Assessment & Plan Note (Signed)
History of, again with crackles on exam today.  She had seen pulmonary previously.  I will update pulmonary for input going forward. ?

## 2021-10-27 ENCOUNTER — Encounter: Payer: Self-pay | Admitting: Pulmonary Disease

## 2021-10-27 ENCOUNTER — Ambulatory Visit (INDEPENDENT_AMBULATORY_CARE_PROVIDER_SITE_OTHER): Payer: Medicare Other | Admitting: Pulmonary Disease

## 2021-10-27 VITALS — BP 102/56 | HR 80 | Temp 97.6°F | Ht 66.0 in | Wt 95.2 lb

## 2021-10-27 DIAGNOSIS — J849 Interstitial pulmonary disease, unspecified: Secondary | ICD-10-CM

## 2021-10-27 MED ORDER — BENZONATATE 200 MG PO CAPS: 200.0000 mg | ORAL_CAPSULE | Freq: Two times a day (BID) | ORAL | 1 refills | Status: DC | PRN

## 2021-10-27 NOTE — Patient Instructions (Addendum)
Your recent chest x-ray shows both lung scarring the lung we will order high resolution CT and pulmonary function tests for evaluation follow-up in clinic 1 to 2 months after the tests for review and plan for next steps

## 2021-10-27 NOTE — Progress Notes (Signed)
Tara Harris    [id number]    02/20/1932  Primary Care Physician:Duncan, Elveria Rising, MD  Referring Physician: Tonia Ghent, MD [address] [place],  [place] [address]  Chief complaint: Follow-up for pulmonary fibrosis  HPI: 88 year old with history of allergies, chronic cough for the past 2 to 3 years Referred for abnormal chest imaging showing pulmonary fibrosis Denies any dyspnea  Patient states that she does not have any dyspnea and feels she does not need any evaluation Case discussed at multidisciplinary conference on 01/15/2020 and presentation was felt to be consistent with either IPAF or IPF  Pets: Cats.  She had a dog in the past.  No birds Occupation: Retired Surveyor, minerals Exposures: No known exposures.  No mold, hot tub, Jacuzzi.  No down pillows or comforter Smoking history: Never smoker Travel history: No significant travel history Relevant family history: No significant family history of lung disease  Interim history: Breathing is worse. She has worsening cough and dyspnea Recent chest x-ray primary care shows worsening pulmonary fibrosis and she has been referred back for reevaluation  Outpatient Encounter Medications as of 10/27/2021  Medication Sig   atorvastatin (LIPITOR) 10 MG tablet TAKE 1 TABLET EACH MORNING   Cholecalciferol (VITAMIN D) 50 MCG (2000 UT) CAPS Take 1 capsule (2,000 Units total) by mouth daily.   levothyroxine (SYNTHROID) 50 MCG tablet TAKE 50 MCG BY MOUTH DAILY ON EMPTY STOMACH.   mirtazapine (REMERON) 7.5 MG tablet Take 1 tablet (7.5 mg total) by mouth at bedtime.   Multiple Vitamins-Minerals (MULTIVITAMIN WITH MINERALS) tablet Take 1 tablet by mouth daily after breakfast.    polyethylene glycol (MIRALAX / GLYCOLAX) packet Take 17 g by mouth daily as needed for mild constipation or moderate constipation.    sodium chloride 1 g tablet Take 1 g by mouth daily.   No facility-administered  encounter medications on file as of 10/27/2021.   Physical Exam: Blood pressure (!) 102/56, pulse 80, temperature 97.6 F (36.4 C), temperature source Oral, height '5\' 6"'$  (1.676 m), weight 95 lb 3.2 oz (43.2 kg), SpO2 95 %. Gen:      No acute distress HEENT:  EOMI, sclera anicteric Neck:     No masses; no thyromegaly Lungs:    Bibasal crackles CV:         Regular rate and rhythm; no murmurs Abd:      + bowel sounds; soft, non-tender; no palpable masses, no distension Ext:    No edema; adequate peripheral perfusion Skin:      Warm and dry; no rash Neuro: alert and oriented x 3 Psych: normal mood and affect  Data Reviewed: Imaging: CT chest 12/14/2019-pulmonary fibrosis, no significant gradient with honeycombing.  Clearly progressed since 2017. Probable UIP pattern.    Chest x-ray 10/09/21 - significant interval increase in coarse interstitial opacities bilaterally  Mammogram 12/26/2019-  Suspicious changes to the left nipple including small flesh-colored mass and retraction. Possibility of Paget's disease should be considered.   Density within the retroareolar left breast likely represents dense fibroglandular tissue.  PFTs:  Labs:  Assessment:  Pulmonary fibrosis Recent chest x-ray shows worsening pulmonary fibrosis There is read of emphysema on the CT but I do not see it She does not have any exposures or connective tissue disease symptoms  Discussed findings with patient and daughter today Similar to our discussion in 2021 she is not interested in treatment with antifibrotics or any other therapies.  I will give them literature to look up regarding the anti-fibrotics High res CT and PFTs for evaluation return to clinic to discuss further  Plan/Recommendations: High resolution CT, PFTs  Marshell Garfinkel MD [facility] 10/27/2021, 3:19 PM  CC: Tonia Ghent, MD

## 2021-11-20 ENCOUNTER — Ambulatory Visit
Admission: RE | Admit: 2021-11-20 | Discharge: 2021-11-20 | Disposition: A | Discharge status: Home / Self Care | Payer: Medicare Other | Source: Ambulatory Visit | Attending: Pulmonary Disease | Admitting: Pulmonary Disease

## 2021-11-20 DIAGNOSIS — J849 Interstitial pulmonary disease, unspecified: Secondary | ICD-10-CM

## 2022-01-01 ENCOUNTER — Ambulatory Visit (INDEPENDENT_AMBULATORY_CARE_PROVIDER_SITE_OTHER): Payer: Medicare Other | Admitting: Pulmonary Disease

## 2022-01-01 ENCOUNTER — Encounter: Payer: Self-pay | Admitting: Pulmonary Disease

## 2022-01-01 VITALS — BP 110/58 | HR 86 | Temp 98.0°F | Ht 66.0 in | Wt 92.0 lb

## 2022-01-01 DIAGNOSIS — J849 Interstitial pulmonary disease, unspecified: Secondary | ICD-10-CM

## 2022-01-01 DIAGNOSIS — J84112 Idiopathic pulmonary fibrosis: Secondary | ICD-10-CM | POA: Diagnosis not present

## 2022-01-01 LAB — PULMONARY FUNCTION TEST
DL/VA % pred: 84 %
DL/VA: 3.35 ml/min/mmHg/L
DLCO cor % pred: 38 %
DLCO cor: 7.5 ml/min/mmHg
DLCO unc % pred: 38 %
DLCO unc: 7.5 ml/min/mmHg
FEF 25-75 Post: 1.25 L/sec
FEF 25-75 Pre: 1.91 L/sec
FEF2575-%Change-Post: -34 %
FEF2575-%Pred-Post: 113 %
FEF2575-%Pred-Pre: 173 %
FEV1-%Change-Post: -8 %
FEV1-%Pred-Post: 47 %
FEV1-%Pred-Pre: 52 %
FEV1-Post: 0.88 L
FEV1-Pre: 0.97 L
FEV1FVC-%Change-Post: -6 %
FEV1FVC-%Pred-Pre: 131 %
FEV6-%Change-Post: -1 %
FEV6-%Pred-Post: 42 %
FEV6-%Pred-Pre: 43 %
FEV6-Post: 0.99 L
FEV6-Pre: 1.01 L
FEV6FVC-%Pred-Post: 106 %
FEV6FVC-%Pred-Pre: 106 %
FVC-%Change-Post: -2 %
FVC-%Pred-Post: 39 %
FVC-%Pred-Pre: 40 %
FVC-Post: 0.99 L
FVC-Pre: 1.02 L
Post FEV1/FVC ratio: 89 %
Post FEV6/FVC ratio: 100 %
Pre FEV1/FVC ratio: 95 %
Pre FEV6/FVC Ratio: 100 %
RV % pred: 99 %
RV: 2.65 L
TLC % pred: 67 %
TLC: 3.64 L

## 2022-01-01 NOTE — Patient Instructions (Signed)
Full PFT performed today. °

## 2022-01-01 NOTE — Progress Notes (Signed)
Full PFT performed today. °

## 2022-01-01 NOTE — Patient Instructions (Signed)
We reviewed his CT and lung function test which shows some worsening due to scarring in the lung We will manage this conservatively and not treat We will continue to monitor symptoms Follow-up in 1 year

## 2022-01-01 NOTE — Progress Notes (Signed)
Tara Harris    [id number]    08-02-1931  Primary Care Physician:Duncan, Elveria Rising, MD  Referring Physician: Tonia Ghent, MD [address] [place],  [place] [address]  Chief complaint: Follow-up for pulmonary fibrosis  HPI: 88 year old with history of allergies, chronic cough for the past 2 to 3 years Referred for abnormal chest imaging showing pulmonary fibrosis Denies any dyspnea  Patient states that she does not have any dyspnea and feels she does not need any evaluation Case discussed at multidisciplinary conference on 01/15/2020 and presentation was felt to be consistent with either IPAF or IPF  Pets: Cats.  She had a dog in the past.  No birds Occupation: Retired Surveyor, minerals Exposures: No known exposures.  No mold, hot tub, Jacuzzi.  No down pillows or comforter Smoking history: Never smoker Travel history: No significant travel history Relevant family history: No significant family history of lung disease  Interim history: States that breathing is stable since last visit.  She is here for review of CT and PFTs  Outpatient Encounter Medications as of 01/01/2022  Medication Sig   atorvastatin (LIPITOR) 10 MG tablet TAKE 1 TABLET EACH MORNING   benzonatate (TESSALON) 200 MG capsule Take 1 capsule (200 mg total) by mouth 2 (two) times daily as needed for cough.   Cholecalciferol (VITAMIN D) 50 MCG (2000 UT) CAPS Take 1 capsule (2,000 Units total) by mouth daily.   levothyroxine (SYNTHROID) 50 MCG tablet TAKE 50 MCG BY MOUTH DAILY ON EMPTY STOMACH.   mirtazapine (REMERON) 7.5 MG tablet Take 1 tablet (7.5 mg total) by mouth at bedtime.   Multiple Vitamins-Minerals (MULTIVITAMIN WITH MINERALS) tablet Take 1 tablet by mouth daily after breakfast.    polyethylene glycol (MIRALAX / GLYCOLAX) packet Take 17 g by mouth daily as needed for mild constipation or moderate constipation.    sodium chloride 1 g tablet Take 1 g by mouth daily.    No facility-administered encounter medications on file as of 01/01/2022.   Physical Exam: Blood pressure (!) 110/58, pulse 86, temperature 98 F (36.7 C), temperature source Oral, height '5\' 6"'$  (1.676 m), weight 92 lb (41.7 kg), SpO2 96 %. Gen:      No acute distress HEENT:  EOMI, sclera anicteric Neck:     No masses; no thyromegaly Lungs:    Clear to auscultation bilaterally; normal respiratory effort CV:         Regular rate and rhythm; no murmurs Abd:      + bowel sounds; soft, non-tender; no palpable masses, no distension Ext:    No edema; adequate peripheral perfusion Skin:      Warm and dry; no rash Neuro: alert and oriented x 3 Psych: normal mood and affect   Data Reviewed: Imaging: CT chest 12/14/2019-pulmonary fibrosis, no significant gradient with honeycombing.  Clearly progressed since 2017. Probable UIP pattern.    Chest x-ray 10/09/21 - significant interval increase in coarse interstitial opacities bilaterally  High-res CT 11/20/2021-UIP fibrosis with progression compared to 2021  Mammogram 12/26/2019-  Suspicious changes to the left nipple including small flesh-colored mass and retraction. Possibility of Paget's disease should be considered.   Density within the retroareolar left breast likely represents dense fibroglandular tissue.  PFTs: 01/01/2022 FVC 0.99 [39%], FEV1 0.88 [47%], F/F 89, TLC 3.64 [67%], DLCO 7.50 [38%] Severe restriction, diffusion defect  Labs:  Assessment:  Idiopathic pulmonary fibrosis CT shows progressive fibrosis in UIP pattern She does  not have any exposures or connective tissue disease symptoms Findings are likely to be due to idiopathic pulmonary fibrosis  Discussed findings with patient and daughter today Similar to our discussion in 2021 she is not interested in treatment with antifibrotics or any other therapies.   Continue conservative management, follow-up in 1 year  Goals of care discussion We discussed trajectory of disease  and outcomes She has elected to be DNR  Plan/Recommendations: Follow-up in 1 year.  Marshell Garfinkel MD [facility] 01/01/2022, 4:10 PM  CC: Tonia Ghent, MD

## 2022-02-01 ENCOUNTER — Ambulatory Visit (INDEPENDENT_AMBULATORY_CARE_PROVIDER_SITE_OTHER)
Admission: RE | Admit: 2022-02-01 | Discharge: 2022-02-01 | Disposition: A | Discharge status: Home / Self Care | Payer: Medicare Other | Source: Ambulatory Visit | Attending: Family Medicine | Admitting: Family Medicine

## 2022-02-01 ENCOUNTER — Ambulatory Visit (INDEPENDENT_AMBULATORY_CARE_PROVIDER_SITE_OTHER): Payer: Medicare Other | Admitting: Family Medicine

## 2022-02-01 ENCOUNTER — Encounter: Payer: Self-pay | Admitting: Family Medicine

## 2022-02-01 ENCOUNTER — Telehealth: Payer: Self-pay | Admitting: *Deleted

## 2022-02-01 VITALS — BP 102/62 | HR 60 | Temp 98.0°F | Ht 66.0 in | Wt 91.0 lb

## 2022-02-01 DIAGNOSIS — R413 Other amnesia: Secondary | ICD-10-CM | POA: Diagnosis not present

## 2022-02-01 DIAGNOSIS — M545 Low back pain, unspecified: Secondary | ICD-10-CM | POA: Diagnosis not present

## 2022-02-01 DIAGNOSIS — K5900 Constipation, unspecified: Secondary | ICD-10-CM

## 2022-02-01 DIAGNOSIS — E038 Other specified hypothyroidism: Secondary | ICD-10-CM

## 2022-02-01 DIAGNOSIS — J84112 Idiopathic pulmonary fibrosis: Secondary | ICD-10-CM

## 2022-02-01 NOTE — Telephone Encounter (Signed)
   Telephone encounter was:  Successful.  02/01/2022 Name: Tara Harris MRN: [id number] DOB: [birth year]  Tara Harris is a 88 y.o. year old female who is a primary care patient of Tonia Ghent, MD . The community resource team was consulted for assistance with in home care   Care guide performed the following interventions: Patient provided with information about care guide support team and interviewed to confirm resource needs. talked with daughter provided information on in home care and emailed a agency list to her  Follow Up Plan:  No further follow up planned at this time. The patient has been provided with needed resources.  [facility] [phone] [address] , [place] [address] Email : [email or web address]

## 2022-02-01 NOTE — Patient Instructions (Addendum)
Restart your meds- use an alarm if needed.  I want to recheck your memory in about 6 weeks.    Call about a fall button.  [phone]   Go to the lab on the way out.   If you have mychart we'll likely use that to update you.    Take care.  Glad to see you.  See about what you want to do, ie getting extra help at home.

## 2022-02-01 NOTE — Progress Notes (Unsigned)
Here today with her daughter.    Back pain. Lower B back pain.  Present intermittently. Going on about 2 weeks.  More pain leaning back but less pain leaning forward while sitting.  No pain walking.  Most recently fell yesterday.  She felt lightheaded on standing.  She isn't taking her salt tab, see below.    Meds use d/w pt. She is having more trouble taking/remembering to take her meds.  She is using a pill box but forgetting her meds.  She is still swallowing her meds well.  Discussed using an alarm, family checking on patient, etc.  D/w pt about rechecking memory when back on meds.    Rectal bleeding.  Noted over the last 2 weeks.  Intermittent.  Appetite is lower, less frequent BMs and straining with BM.  BRBPR, scant amount.  No black stools.  Down 3 lbs in the meantime, over the last few months.    Progressive pulmonary fibrosis d/w pt.  D/w pt about escalating level of care at home for this and her memory changes.    She has had some numbness in the BLE, unclear of from being off thyroid replacement.    Discussed multiple effects of being off her thyroid replacement, fatigue, memory changes, constipation, etc.  Meds, vitals, and allergies reviewed.   ROS: Per HPI unless specifically indicated in ROS section   Nad Ncat Neck supple, no LA Rrr Lungs mostly clear, faint crackles noted.  No increased work of breathing. L lower back ttp but not pain on twisting.   Skin well perfused.  35 minutes were devoted to patient care in this encounter (this includes time spent reviewing the patient's file/history, interviewing and examining the patient, counseling/reviewing plan with patient).

## 2022-02-02 ENCOUNTER — Other Ambulatory Visit: Payer: Self-pay | Admitting: Family Medicine

## 2022-02-02 ENCOUNTER — Encounter: Payer: Self-pay | Admitting: Family Medicine

## 2022-02-02 DIAGNOSIS — M810 Age-related osteoporosis without current pathological fracture: Secondary | ICD-10-CM

## 2022-02-02 MED ORDER — VITAMIN D 50 MCG (2000 UT) PO CAPS: 1.0000 | ORAL_CAPSULE | Freq: Every day | ORAL | 1 refills | Status: AC

## 2022-02-02 MED ORDER — ATORVASTATIN CALCIUM 10 MG PO TABS: ORAL_TABLET | ORAL | 1 refills | Status: AC

## 2022-02-02 MED ORDER — SODIUM CHLORIDE 1 G PO TABS: 1.0000 g | ORAL_TABLET | Freq: Every day | ORAL | 1 refills | Status: AC

## 2022-02-03 DIAGNOSIS — K5900 Constipation, unspecified: Secondary | ICD-10-CM | POA: Insufficient documentation

## 2022-02-03 NOTE — Assessment & Plan Note (Signed)
Discussed multiple effects of being off her thyroid replacement, fatigue, memory changes, constipation, etc. restart replacement.

## 2022-02-03 NOTE — Assessment & Plan Note (Signed)
See notes on imaging.  Reasonable to use Tylenol as needed.

## 2022-02-03 NOTE — Assessment & Plan Note (Signed)
See above.  She has seen pulmonary before.  No change in medications at this point.

## 2022-02-03 NOTE — Assessment & Plan Note (Signed)
Likely estimated by being off thyroid replacement.  Increase fiber, use MiraLAX as needed, restart levothyroxine.  I suspect she had a hemorrhoid that bled.

## 2022-02-03 NOTE — Assessment & Plan Note (Signed)
Discussed with patient and daughter about setting an alarm/getting extra help with medication administration at home.  Social work referral placed.  I would recheck her memory when she is back on her medication.

## 2022-03-23 ENCOUNTER — Emergency Department (HOSPITAL_BASED_OUTPATIENT_CLINIC_OR_DEPARTMENT_OTHER): Payer: Medicare Other

## 2022-03-23 ENCOUNTER — Other Ambulatory Visit: Payer: Self-pay

## 2022-03-23 ENCOUNTER — Emergency Department (HOSPITAL_BASED_OUTPATIENT_CLINIC_OR_DEPARTMENT_OTHER): Payer: Medicare Other | Admitting: Radiology

## 2022-03-23 ENCOUNTER — Emergency Department (HOSPITAL_BASED_OUTPATIENT_CLINIC_OR_DEPARTMENT_OTHER)
Admission: EM | Admit: 2022-03-23 | Discharge: 2022-03-23 | Disposition: A | Discharge status: Home / Self Care | Payer: Medicare Other | Attending: Emergency Medicine | Admitting: Emergency Medicine

## 2022-03-23 ENCOUNTER — Encounter (HOSPITAL_BASED_OUTPATIENT_CLINIC_OR_DEPARTMENT_OTHER): Payer: Self-pay | Admitting: Emergency Medicine

## 2022-03-23 DIAGNOSIS — R0781 Pleurodynia: Secondary | ICD-10-CM | POA: Diagnosis present

## 2022-03-23 DIAGNOSIS — Z79899 Other long term (current) drug therapy: Secondary | ICD-10-CM | POA: Diagnosis not present

## 2022-03-23 DIAGNOSIS — I700 Atherosclerosis of aorta: Secondary | ICD-10-CM | POA: Diagnosis not present

## 2022-03-23 DIAGNOSIS — W01198A Fall on same level from slipping, tripping and stumbling with subsequent striking against other object, initial encounter: Secondary | ICD-10-CM | POA: Insufficient documentation

## 2022-03-23 DIAGNOSIS — J849 Interstitial pulmonary disease, unspecified: Secondary | ICD-10-CM | POA: Diagnosis not present

## 2022-03-23 DIAGNOSIS — S270XXA Traumatic pneumothorax, initial encounter: Secondary | ICD-10-CM

## 2022-03-23 DIAGNOSIS — R519 Headache, unspecified: Secondary | ICD-10-CM | POA: Insufficient documentation

## 2022-03-23 DIAGNOSIS — M546 Pain in thoracic spine: Secondary | ICD-10-CM | POA: Diagnosis not present

## 2022-03-23 DIAGNOSIS — R109 Unspecified abdominal pain: Secondary | ICD-10-CM | POA: Diagnosis not present

## 2022-03-23 DIAGNOSIS — I2510 Atherosclerotic heart disease of native coronary artery without angina pectoris: Secondary | ICD-10-CM | POA: Diagnosis not present

## 2022-03-23 DIAGNOSIS — J939 Pneumothorax, unspecified: Secondary | ICD-10-CM | POA: Diagnosis not present

## 2022-03-23 LAB — COMPREHENSIVE METABOLIC PANEL
ALT: 15 U/L (ref 0–44)
AST: 22 U/L (ref 15–41)
Albumin: 4 g/dL (ref 3.5–5.0)
Alkaline Phosphatase: 48 U/L (ref 38–126)
Anion gap: 7 (ref 5–15)
BUN: 19 mg/dL (ref 8–23)
CO2: 30 mmol/L (ref 22–32)
Calcium: 9.7 mg/dL (ref 8.9–10.3)
Chloride: 95 mmol/L — ABNORMAL LOW (ref 98–111)
Creatinine, Ser: 0.56 mg/dL (ref 0.44–1.00)
GFR, Estimated: >60 mL/min (ref >60)
Glucose, Bld: 106 mg/dL — ABNORMAL HIGH (ref 70–99)
Potassium: 4.2 mmol/L (ref 3.5–5.1)
Sodium: 132 mmol/L — ABNORMAL LOW (ref 135–145)
Total Bilirubin: 1.1 mg/dL (ref 0.3–1.2)
Total Protein: 7.7 g/dL (ref 6.5–8.1)

## 2022-03-23 LAB — CBC WITH DIFFERENTIAL/PLATELET
Abs Immature Granulocytes: 0.04 10*3/uL (ref 0.00–0.07)
Basophils Absolute: 0 10*3/uL (ref 0.0–0.1)
Basophils Relative: 0 %
Eosinophils Absolute: 0.1 10*3/uL (ref 0.0–0.5)
Eosinophils Relative: 1 %
HCT: 34.8 % — ABNORMAL LOW (ref 36.0–46.0)
Hemoglobin: 11.6 g/dL — ABNORMAL LOW (ref 12.0–15.0)
Immature Granulocytes: 1 %
Lymphocytes Relative: 10 %
Lymphs Abs: 0.8 10*3/uL (ref 0.7–4.0)
MCH: 32 pg (ref 26.0–34.0)
MCHC: 33.3 g/dL (ref 30.0–36.0)
MCV: 96.1 fL (ref 80.0–100.0)
Monocytes Absolute: 0.6 10*3/uL (ref 0.1–1.0)
Monocytes Relative: 8 %
Neutro Abs: 6.4 10*3/uL (ref 1.7–7.7)
Neutrophils Relative %: 80 %
Platelets: 175 10*3/uL (ref 150–400)
RBC: 3.62 MIL/uL — ABNORMAL LOW (ref 3.87–5.11)
RDW: 13.2 % (ref 11.5–15.5)
WBC: 7.9 10*3/uL (ref 4.0–10.5)
nRBC: 0 % (ref 0.0–0.2)

## 2022-03-23 MED ORDER — DICLOFENAC SODIUM 1 % EX GEL: 4.0000 g | Freq: Four times a day (QID) | CUTANEOUS | 0 refills | Status: DC

## 2022-03-23 MED ORDER — IOHEXOL 300 MG/ML  SOLN
100.0000 mL | Freq: Once | INTRAMUSCULAR | Status: AC | PRN
  Administered 2022-03-23: 60 mL via INTRAVENOUS

## 2022-03-23 NOTE — ED Triage Notes (Signed)
Pt arrived POV with family. Pt reports she had a mechanical fall on Sunday causing her to fall onto a wooden chest. Pt c/o pain around the torso around the rib area. Pt also reports she hit her head and has hematoma to the back of the head. Pt denies worsening SOB, CP, N/V, dizziness.

## 2022-03-23 NOTE — ED Provider Notes (Signed)
Tara Harris Provider Note   CSN: [id number] Arrival date & time: 03/23/22  1726     History  Chief Complaint  Patient presents with   [facility] Tara Harris is a 88 y.o. female.  88 yo F with a chief complaints of a fall.  This occurred a few days ago.  She had bent down to evaluate her And she lost her balance and fell.  She struck the back of her head she has been complaining of some pain mostly to her back and around the lower aspect of the chest.  This was not noticed initially.  Seem to occur the next day after she had awoken.  Pain got worse today about lunchtime.  Denies any repeat injury.  Denies confusion denies vomiting denies neck pain.  She denies extremity pain.  Has had chronic problems with cold lower extremities bilaterally.   Fall       Home Medications Prior to Admission medications   Medication Sig Start Date End Date Taking? Authorizing Provider  atorvastatin (LIPITOR) 10 MG tablet TAKE 1 TABLET EACH MORNING 02/02/22   Tonia Ghent, MD  Cholecalciferol (VITAMIN D) 50 MCG (2000 UT) CAPS Take 1 capsule (2,000 Units total) by mouth daily. 02/02/22   Tonia Ghent, MD  levothyroxine (SYNTHROID) 50 MCG tablet TAKE 50 MCG BY MOUTH DAILY ON EMPTY STOMACH. 04/10/21   Tonia Ghent, MD  mirtazapine (REMERON) 7.5 MG tablet Take 1 tablet (7.5 mg total) by mouth at bedtime. 10/09/21   Tonia Ghent, MD  Multiple Vitamins-Minerals (MULTIVITAMIN WITH MINERALS) tablet Take 1 tablet by mouth daily after breakfast.     [provider]  polyethylene glycol (MIRALAX / GLYCOLAX) packet Take 17 g by mouth daily as needed for mild constipation or moderate constipation.     [provider]  sodium chloride 1 g tablet Take 1 tablet (1 g total) by mouth daily. 02/02/22   Tonia Ghent, MD      Allergies    Patient has no known allergies.    Review of Systems   Review of Systems  Physical Exam Updated Vital Signs BP  135/76 (BP Location: Left Arm)   Pulse 95   Temp (!) 97.2 F (36.2 C)   Resp (!) 22   Ht '5\' 6"'$  (1.676 m)   Wt 41.7 kg   SpO2 97%   BMI 14.85 kg/m  Physical Exam Vitals and nursing note reviewed.  Constitutional:      General: She is not in acute distress.    Appearance: She is well-developed. She is not diaphoretic.  HENT:     Head: Normocephalic and atraumatic.  Eyes:     Pupils: Pupils are equal, round, and reactive to light.  Cardiovascular:     Rate and Rhythm: Normal rate and regular rhythm.     Heart sounds: No murmur heard.    No friction rub. No gallop.  Pulmonary:     Effort: Pulmonary effort is normal.     Breath sounds: No wheezing or rales.  Abdominal:     General: There is no distension.     Palpations: Abdomen is soft.     Tenderness: There is no abdominal tenderness.  Musculoskeletal:        General: No tenderness.     Cervical back: Normal range of motion and neck supple.     Comments: She has some mild right posterior rib angle tenderness.  No obvious signs of  trauma.  No midline spinal tenderness step-offs or deformities.  She is able to rotate her head 45 degrees in either direction without pain.  No appreciable abdominal discomfort.  No bruising to the abdomen.  No obvious chest wall discomfort other than previously noted.  She has very cold lower extremities.  She has decreased cap refill from the ankle distally.  She has intact dorsalis pedis pulses bilaterally.  Skin:    General: Skin is warm and dry.  Neurological:     Mental Status: She is alert and oriented to person, place, and time.  Psychiatric:        Behavior: Behavior normal.     ED Results / Procedures / Treatments   Labs (all labs ordered are listed, but only abnormal results are displayed) Labs Reviewed - No data to display  EKG None  Radiology No results found.  Procedures Procedures    Medications Ordered in ED Medications - No data to display  ED Course/ Medical  Decision Making/ A&P                           Medical Decision Making Amount and/or Complexity of Data Reviewed Labs: ordered. Radiology: ordered.  Risk Prescription drug management.   88 yo F with a chief complaints of a fall.  Nonsyncopal by history.  She initially had no symptoms and then developed some midthoracic back pain that seems to wrap around bilateral sides.  She has had no difficulty breathing.  Has had a little bit of a cough the past couple days.  She does think she struck her head with this.  On exam incidentally she was noted to have very cold lower extremities.  Reportedly this is a chronic problem for her.  On my record review she actually had vascular studies done in March of this year that had no arterial findings.  We will obtain a CT scan of the head.  Plain film of the chest and T-spine.  Reassess.  Plain film the chest concerning for a left apical pneumothorax.  We will obtain CT imaging of the chest to the pelvis.  CT of the T-spine without obvious acute fracture on my independent interpretation.  CT of the head on my independent interpretation without obvious intracranial hemorrhage.  CT head of the chest abdomen pelvis with left apical pneumothorax without any obvious rib fractures, no obvious acute injuries otherwise.  I discussed the case with Dr. Grandville Silos, trauma surgery as patient appears well and has no other complaints feels reasonable for discharge home and PCP follow-up.  10:05 PM:  I have discussed the diagnosis/risks/treatment options with the patient and family.  Evaluation and diagnostic testing in the emergency department does not suggest an emergent condition requiring admission or immediate intervention beyond what has been performed at this time.  They will follow up with PCP. We also discussed returning to the ED immediately if new or worsening sx occur. We discussed the sx which are most concerning (e.g., sudden worsening pain, fever, inability  to tolerate by mouth, sob, syncope, near syncope) that necessitate immediate return. Medications administered to the patient during their visit and any new prescriptions provided to the patient are listed below.  Medications given during this visit Medications  iohexol (OMNIPAQUE) 300 MG/ML solution 100 mL (60 mLs Intravenous Contrast Given 03/23/22 2023)     The patient appears reasonably screen and/or stabilized for discharge and I doubt any other medical condition or other [facility]  requiring further screening, evaluation, or treatment in the ED at this time prior to discharge.          Final Clinical Impression(s) / ED Diagnoses Final diagnoses:  None    Rx / DC Orders ED Discharge Orders     None         Deno Etienne, DO 03/23/22 2205

## 2022-03-23 NOTE — ED Notes (Signed)
Pt given water, per Shirlean Mylar - Medic

## 2022-03-23 NOTE — Discharge Instructions (Addendum)
Please return for worsening chest or back pain or difficulty with breathing or if you feel like you get a pass out.  Use the gel as prescribed Also take tylenol '1000mg'$ (2 extra strength) four times a day.

## 2022-03-24 ENCOUNTER — Telehealth: Payer: Self-pay | Admitting: Family Medicine

## 2022-03-24 NOTE — Telephone Encounter (Signed)
Pt friend called in wanted to make PCP aware that pt lost her balance and fell . Pt was seen at ED . Stated pt daughter will call back to schedule F/U

## 2022-03-24 NOTE — Telephone Encounter (Signed)
Noted. Thanks.

## 2022-03-30 ENCOUNTER — Telehealth: Payer: Self-pay

## 2022-03-30 NOTE — Chronic Care Management (AMB) (Signed)
    Chronic Care Management Pharmacy Assistant   Name: Tara Harris  MRN: [id number] DOB: 1932/04/15  Reason for Encounter: Hospital Follow Up non ccm   Medications: Outpatient Encounter Medications as of 03/30/2022  Medication Sig   atorvastatin (LIPITOR) 10 MG tablet TAKE 1 TABLET EACH MORNING   Cholecalciferol (VITAMIN D) 50 MCG (2000 UT) CAPS Take 1 capsule (2,000 Units total) by mouth daily.   diclofenac Sodium (VOLTAREN) 1 % GEL Apply 4 g topically 4 (four) times daily.   levothyroxine (SYNTHROID) 50 MCG tablet TAKE 50 MCG BY MOUTH DAILY ON EMPTY STOMACH.   mirtazapine (REMERON) 7.5 MG tablet Take 1 tablet (7.5 mg total) by mouth at bedtime.   Multiple Vitamins-Minerals (MULTIVITAMIN WITH MINERALS) tablet Take 1 tablet by mouth daily after breakfast.    polyethylene glycol (MIRALAX / GLYCOLAX) packet Take 17 g by mouth daily as needed for mild constipation or moderate constipation.    sodium chloride 1 g tablet Take 1 tablet (1 g total) by mouth daily.   No facility-administered encounter medications on file as of 03/30/2022.    Reviewed hospital notes for details of recent visit. Has patient been contacted by Transitions of Care team? No Has patient seen PCP/specialist for hospital follow up (summarize OV if yes): No  Admitted to the ED on 03/23/22. Discharge date was 03/23/22.  Discharged from [facility] ED  Discharge diagnosis (Principal Problem): traumatic pneumothorax  Patient was discharged to Home  Brief summary of hospital course: 88 yo F with a chief complaints of a fall.  Nonsyncopal by history.  She initially had no symptoms and then developed some midthoracic back pain that seems to wrap around bilateral sides.  She has had no difficulty breathing.  Has had a little bit of a cough the past couple days.  She does think she struck her head with this.Plain film the chest concerning for a left apical pneumothorax.  We will obtain CT imaging of the  chest to the pelvis. CT head of the chest abdomen pelvis with left apical pneumothorax without any obvious rib fractures, no obvious acute injuries otherwise.  I discussed the case with Dr. Grandville Silos, trauma surgery as patient appears well and has no other complaints feels reasonable for discharge home and PCP follow-up.  New?Medications Started at [facility] Discharge:?? -Started  voltaren gel acetaminophen   Medications that remain the same after Hospital Discharge:??  -All other medications will remain the same.    Next CCM appt: none  Other upcoming appts: No appointments scheduled within the next 30 days.  Charlene Brooke, PharmD notified and will determine if action is needed.    Avel Sensor, [place]  [phone]

## 2022-04-06 NOTE — Telephone Encounter (Signed)
Patient due for hospital f/u appt; please call to schedule

## 2022-04-06 NOTE — Telephone Encounter (Signed)
Pt seen in ED for fall 10/17. Per ED notes pt was advised to follow up with PCP. Per chart review she has not scheduled PCP appt yet.

## 2022-04-06 NOTE — Telephone Encounter (Signed)
Lvmtcb, sent mychart message  

## 2022-04-28 ENCOUNTER — Other Ambulatory Visit: Payer: Self-pay | Admitting: Family Medicine

## 2022-04-28 DIAGNOSIS — E034 Atrophy of thyroid (acquired): Secondary | ICD-10-CM

## 2022-05-11 ENCOUNTER — Other Ambulatory Visit: Payer: Self-pay

## 2022-05-11 ENCOUNTER — Encounter: Payer: Self-pay | Admitting: Pulmonary Disease

## 2022-05-11 ENCOUNTER — Inpatient Hospital Stay (HOSPITAL_BASED_OUTPATIENT_CLINIC_OR_DEPARTMENT_OTHER)
Admission: EM | Admit: 2022-05-11 | Discharge: 2022-05-20 | DRG: 175 | Disposition: A | Discharge status: Skilled Nursing Facility | Payer: Medicare Other | Attending: Internal Medicine | Admitting: Internal Medicine

## 2022-05-11 ENCOUNTER — Ambulatory Visit (INDEPENDENT_AMBULATORY_CARE_PROVIDER_SITE_OTHER): Payer: Medicare Other

## 2022-05-11 ENCOUNTER — Inpatient Hospital Stay (HOSPITAL_BASED_OUTPATIENT_CLINIC_OR_DEPARTMENT_OTHER)
Admission: RE | Admit: 2022-05-11 | Discharge: 2022-05-11 | Disposition: A | Discharge status: Home / Self Care | Payer: Medicare Other | Source: Ambulatory Visit | Attending: Pulmonary Disease | Admitting: Pulmonary Disease

## 2022-05-11 ENCOUNTER — Ambulatory Visit (INDEPENDENT_AMBULATORY_CARE_PROVIDER_SITE_OTHER): Payer: Medicare Other | Admitting: Pulmonary Disease

## 2022-05-11 ENCOUNTER — Other Ambulatory Visit: Payer: Self-pay | Admitting: *Deleted

## 2022-05-11 VITALS — BP 108/62 | HR 72 | Temp 97.8°F | Ht 67.0 in | Wt 84.0 lb

## 2022-05-11 DIAGNOSIS — R7989 Other specified abnormal findings of blood chemistry: Secondary | ICD-10-CM | POA: Insufficient documentation

## 2022-05-11 DIAGNOSIS — I82413 Acute embolism and thrombosis of femoral vein, bilateral: Secondary | ICD-10-CM | POA: Diagnosis present

## 2022-05-11 DIAGNOSIS — E038 Other specified hypothyroidism: Secondary | ICD-10-CM

## 2022-05-11 DIAGNOSIS — Z79890 Hormone replacement therapy (postmenopausal): Secondary | ICD-10-CM

## 2022-05-11 DIAGNOSIS — E871 Hypo-osmolality and hyponatremia: Secondary | ICD-10-CM | POA: Diagnosis present

## 2022-05-11 DIAGNOSIS — I2699 Other pulmonary embolism without acute cor pulmonale: Principal | ICD-10-CM | POA: Diagnosis present

## 2022-05-11 DIAGNOSIS — J849 Interstitial pulmonary disease, unspecified: Secondary | ICD-10-CM

## 2022-05-11 DIAGNOSIS — Z681 Body mass index (BMI) 19 or less, adult: Secondary | ICD-10-CM

## 2022-05-11 DIAGNOSIS — E7800 Pure hypercholesterolemia, unspecified: Secondary | ICD-10-CM | POA: Diagnosis present

## 2022-05-11 DIAGNOSIS — M4854XA Collapsed vertebra, not elsewhere classified, thoracic region, initial encounter for fracture: Secondary | ICD-10-CM | POA: Diagnosis present

## 2022-05-11 DIAGNOSIS — S270XXA Traumatic pneumothorax, initial encounter: Secondary | ICD-10-CM | POA: Diagnosis present

## 2022-05-11 DIAGNOSIS — J84112 Idiopathic pulmonary fibrosis: Secondary | ICD-10-CM

## 2022-05-11 DIAGNOSIS — K589 Irritable bowel syndrome without diarrhea: Secondary | ICD-10-CM | POA: Diagnosis present

## 2022-05-11 DIAGNOSIS — Z66 Do not resuscitate: Secondary | ICD-10-CM | POA: Diagnosis present

## 2022-05-11 DIAGNOSIS — E785 Hyperlipidemia, unspecified: Secondary | ICD-10-CM | POA: Diagnosis present

## 2022-05-11 DIAGNOSIS — E039 Hypothyroidism, unspecified: Secondary | ICD-10-CM | POA: Diagnosis present

## 2022-05-11 DIAGNOSIS — R0602 Shortness of breath: Secondary | ICD-10-CM

## 2022-05-11 DIAGNOSIS — N179 Acute kidney failure, unspecified: Secondary | ICD-10-CM | POA: Diagnosis present

## 2022-05-11 DIAGNOSIS — R634 Abnormal weight loss: Secondary | ICD-10-CM | POA: Diagnosis present

## 2022-05-11 DIAGNOSIS — G8929 Other chronic pain: Secondary | ICD-10-CM | POA: Diagnosis present

## 2022-05-11 DIAGNOSIS — Z86711 Personal history of pulmonary embolism: Secondary | ICD-10-CM

## 2022-05-11 DIAGNOSIS — Z79899 Other long term (current) drug therapy: Secondary | ICD-10-CM

## 2022-05-11 DIAGNOSIS — W19XXXA Unspecified fall, initial encounter: Secondary | ICD-10-CM | POA: Diagnosis present

## 2022-05-11 DIAGNOSIS — Z8249 Family history of ischemic heart disease and other diseases of the circulatory system: Secondary | ICD-10-CM

## 2022-05-11 DIAGNOSIS — I5032 Chronic diastolic (congestive) heart failure: Secondary | ICD-10-CM | POA: Diagnosis present

## 2022-05-11 DIAGNOSIS — E43 Unspecified severe protein-calorie malnutrition: Secondary | ICD-10-CM | POA: Diagnosis present

## 2022-05-11 DIAGNOSIS — J939 Pneumothorax, unspecified: Secondary | ICD-10-CM | POA: Diagnosis present

## 2022-05-11 DIAGNOSIS — S22070A Wedge compression fracture of T9-T10 vertebra, initial encounter for closed fracture: Secondary | ICD-10-CM | POA: Diagnosis present

## 2022-05-11 LAB — CBC WITH DIFFERENTIAL/PLATELET
Abs Immature Granulocytes: 0.03 10*3/uL (ref 0.00–0.07)
Basophils Absolute: 0.1 10*3/uL (ref 0.0–0.1)
Basophils Absolute: 0.1 10*3/uL (ref 0.0–0.1)
Basophils Relative: 1.3 % (ref 0.0–3.0)
Basophils Relative: 1 %
Eosinophils Absolute: 0.1 10*3/uL (ref 0.0–0.7)
Eosinophils Absolute: 0.1 10*3/uL (ref 0.0–0.5)
Eosinophils Relative: 1.6 % (ref 0.0–5.0)
Eosinophils Relative: 1 %
HCT: 37.9 % (ref 36.0–46.0)
HCT: 35.1 % — ABNORMAL LOW (ref 36.0–46.0)
Hemoglobin: 12.8 g/dL (ref 12.0–15.0)
Hemoglobin: 11.9 g/dL — ABNORMAL LOW (ref 12.0–15.0)
Immature Granulocytes: 0 %
Lymphocytes Relative: 12.4 % (ref 12.0–46.0)
Lymphocytes Relative: 12 %
Lymphs Abs: 1.1 10*3/uL (ref 0.7–4.0)
Lymphs Abs: 1 10*3/uL (ref 0.7–4.0)
MCH: 32.3 pg (ref 26.0–34.0)
MCHC: 33.8 g/dL (ref 30.0–36.0)
MCHC: 33.9 g/dL (ref 30.0–36.0)
MCV: 95.7 fl (ref 78.0–100.0)
MCV: 95.4 fL (ref 80.0–100.0)
Monocytes Absolute: 0.9 10*3/uL (ref 0.1–1.0)
Monocytes Absolute: 0.7 10*3/uL (ref 0.1–1.0)
Monocytes Relative: 9.7 % (ref 3.0–12.0)
Monocytes Relative: 8 %
Neutro Abs: 6.6 10*3/uL (ref 1.4–7.7)
Neutro Abs: 6.9 10*3/uL (ref 1.7–7.7)
Neutrophils Relative %: 75 % (ref 43.0–77.0)
Neutrophils Relative %: 78 %
Platelets: 303 10*3/uL (ref 150.0–400.0)
Platelets: 266 10*3/uL (ref 150–400)
RBC: 3.96 Mil/uL (ref 3.87–5.11)
RBC: 3.68 MIL/uL — ABNORMAL LOW (ref 3.87–5.11)
RDW: 14.4 % (ref 11.5–15.5)
RDW: 13.3 % (ref 11.5–15.5)
WBC: 8.9 10*3/uL (ref 4.0–10.5)
WBC: 8.8 10*3/uL (ref 4.0–10.5)
nRBC: 0 % (ref 0.0–0.2)

## 2022-05-11 LAB — COMPREHENSIVE METABOLIC PANEL
ALT: 16 U/L (ref 0–35)
ALT: 14 U/L (ref 0–44)
AST: 23 U/L (ref 0–37)
AST: 21 U/L (ref 15–41)
Albumin: 4.3 g/dL (ref 3.5–5.2)
Albumin: 4.2 g/dL (ref 3.5–5.0)
Alkaline Phosphatase: 66 U/L (ref 39–117)
Alkaline Phosphatase: 60 U/L (ref 38–126)
Anion gap: 10 (ref 5–15)
BUN: 17 mg/dL (ref 6–23)
BUN: 24 mg/dL — ABNORMAL HIGH (ref 8–23)
CO2: 32 mEq/L (ref 19–32)
CO2: 31 mmol/L (ref 22–32)
Calcium: 10.2 mg/dL (ref 8.4–10.5)
Calcium: 10.1 mg/dL (ref 8.9–10.3)
Chloride: 94 mEq/L — ABNORMAL LOW (ref 96–112)
Chloride: 92 mmol/L — ABNORMAL LOW (ref 98–111)
Creatinine, Ser: 0.78 mg/dL (ref 0.40–1.20)
Creatinine, Ser: 1.31 mg/dL — ABNORMAL HIGH (ref 0.44–1.00)
GFR, Estimated: 39 mL/min — ABNORMAL LOW (ref >60)
GFR: 67.64 mL/min (ref >60.00)
Glucose, Bld: 115 mg/dL — ABNORMAL HIGH (ref 70–99)
Glucose, Bld: 120 mg/dL — ABNORMAL HIGH (ref 70–99)
Potassium: 4.2 mEq/L (ref 3.5–5.1)
Potassium: 4.2 mmol/L (ref 3.5–5.1)
Sodium: 134 mEq/L — ABNORMAL LOW (ref 135–145)
Sodium: 133 mmol/L — ABNORMAL LOW (ref 135–145)
Total Bilirubin: 1.4 mg/dL — ABNORMAL HIGH (ref 0.2–1.2)
Total Bilirubin: 1.1 mg/dL (ref 0.3–1.2)
Total Protein: 8.2 g/dL (ref 6.0–8.3)
Total Protein: 7.8 g/dL (ref 6.5–8.1)

## 2022-05-11 LAB — TSH: TSH: 1.63 u[IU]/mL (ref 0.35–5.50)

## 2022-05-11 LAB — D-DIMER, QUANTITATIVE: D-Dimer, Quant: 3.51 mcg/mL FEU — ABNORMAL HIGH (ref <0.50)

## 2022-05-11 MED ORDER — IOHEXOL 350 MG/ML SOLN
100.0000 mL | Freq: Once | INTRAVENOUS | Status: AC | PRN
  Administered 2022-05-11: 50 mL via INTRAVENOUS

## 2022-05-11 MED ORDER — HEPARIN (PORCINE) 25000 UT/250ML-% IV SOLN
700.0000 [IU]/h | INTRAVENOUS | Status: DC
  Administered 2022-05-12 – 2022-05-14 (×3): 650 [IU]/h via INTRAVENOUS
  Filled 2022-05-11 (×3): qty 250

## 2022-05-11 MED ORDER — HEPARIN BOLUS VIA INFUSION
2800.0000 [IU] | Freq: Once | INTRAVENOUS | Status: AC
  Administered 2022-05-12: 2800 [IU] via INTRAVENOUS

## 2022-05-11 NOTE — Progress Notes (Signed)
Received call report from radiology about CTA today showing right PE with RV strain, pneumothorax is larger on left. I called daughter [name] at [phone] to discuss findings and advised to go to ED for evaluation and admission  Marshell Garfinkel MD [facility] See Amion for pager  If no response to pager , please call [phone] until 7pm After 7:00 pm call Elink  [phone] 05/11/2022, 7:12 PM

## 2022-05-11 NOTE — Patient Instructions (Addendum)
Will check some labs today including TSH, CBC, CMP and D-dimer Check chest x-ray today Order nocturnal oximetry to evaluate need for supplemental oxygen Return to clinic in 3 to 4 months with video visit or in person visit.

## 2022-05-11 NOTE — ED Triage Notes (Signed)
Pt from home with verified pulmonary embolism from CT today. Pt was called by her provider and told to come in to ED for probable admission to hospital. Pt c/o sob r/t pulmonary fibrosis. Pt O2 sat 99% at triage. Pt is a bit pale and is using some accessory muscles to breathe. Pt alert & oriented, nad noted.

## 2022-05-11 NOTE — Progress Notes (Signed)
ANTICOAGULATION CONSULT NOTE - Initial Consult  Pharmacy Consult for heparin Indication: pulmonary embolus  No Known Allergies  Patient Measurements: Height: '5\' 7"'$  (170.2 cm) Weight: 38.1 kg (84 lb) IBW/kg (Calculated) : 61.6 Heparin Dosing Weight: TBW  Vital Signs: Temp: 97.2 F (36.2 C) (12/05 2059) Temp Source: Temporal (12/05 2059) BP: 119/69 (12/05 2300) Pulse Rate: 81 (12/05 2300)  Labs: Recent Labs    05/11/22 2158  HGB 11.9*  HCT 35.1*  PLT 266  CREATININE 1.31*    Estimated Creatinine Clearance: 17.9 mL/min (A) (by C-G formula based on SCr of 1.31 mg/dL (H)).   Medical History: Past Medical History:  Diagnosis Date   Arthritis    High cholesterol    Hypothyroidism    IBS (irritable bowel syndrome)    Menopause    Pulmonary fibrosis (HCC)    Thyroid disease    Tubular adenoma 01/13/2007    Assessment: 80 YOF presenting with SOB, outpt CT with PE with RHS.  She is not on anticoagulation PTA, chronic anemia table  Goal of Therapy:  Heparin level 0.3-0.7 units/ml Monitor platelets by anticoagulation protocol: Yes   Plan:  Heparin 2800 units IV x 1, and gtt at 650 units/hr F/u 8 hour heparin level  Bertis Ruddy, PharmD, [place] Pharmacist ED Pharmacist Phone # [phone] 05/11/2022 11:36 PM

## 2022-05-11 NOTE — Progress Notes (Addendum)
Tara Harris    [id number]    [birth year]  Primary Care Physician:Duncan, Elveria Rising, MD  Referring Physician: Tonia Ghent, MD [address] [place],  [place] [address]  Chief complaint: Follow-up for pulmonary fibrosis  HPI: 88 y.o.  with history of allergies, chronic cough for the past 2 to 3 years Referred for abnormal chest imaging showing pulmonary fibrosis Denies any dyspnea  Patient states that she does not have any dyspnea and feels she does not need any evaluation Case discussed at multidisciplinary conference on 01/15/2020 and presentation was felt to be consistent with either IPAF or IPF  Pets: Cats.  She had a dog in the past.  No birds Occupation: Retired Surveyor, [employer] Exposures: No known exposures.  No mold, hot tub, Jacuzzi.  No down pillows or comforter Smoking history: Never smoker Travel history: No significant travel history Relevant family history: No significant family history of lung disease  Interim history: Complains of worsening dyspnea, hyperventilation. As per the daughter she had not been taking her synthyroid medication for some time and then resumed it at double the dose a month ago  She had an emergency room visit on 03/23/2022 with a fall.  CT chest at that time showed small apical left pneumothorax with underlying pulmonary fibrosis.  She did not require hospitalization.  Outpatient Encounter Medications as of 05/11/2022  Medication Sig   atorvastatin (LIPITOR) 10 MG tablet TAKE 1 TABLET EACH MORNING   diclofenac Sodium (VOLTAREN) 1 % GEL Apply 4 g topically 4 (four) times daily.   levothyroxine (SYNTHROID) 50 MCG tablet TAKE 50 MCG BY MOUTH DAILY ON EMPTY STOMACH.   sodium chloride 1 g tablet Take 1 tablet (1 g total) by mouth daily.   Cholecalciferol (VITAMIN D) 50 MCG (2000 UT) CAPS Take 1 capsule (2,000 Units total) by mouth daily. (Patient not taking: Reported on 05/11/2022)   mirtazapine  (REMERON) 7.5 MG tablet TAKE 1 TABLET BY MOUTH AT BEDTIME. (Patient not taking: Reported on 05/11/2022)   Multiple Vitamins-Minerals (MULTIVITAMIN WITH MINERALS) tablet Take 1 tablet by mouth daily after breakfast.  (Patient not taking: Reported on 05/11/2022)   polyethylene glycol (MIRALAX / GLYCOLAX) packet Take 17 g by mouth daily as needed for mild constipation or moderate constipation.  (Patient not taking: Reported on 05/11/2022)   No facility-administered encounter medications on file as of 05/11/2022.   Physical Exam: Blood pressure 108/62, pulse 72, temperature 97.8 F (36.6 C), temperature source Oral, height '5\' 7"'$  (1.702 m), weight 84 lb (38.1 kg), SpO2 94 %. Gen:      No acute distress HEENT:  EOMI, sclera anicteric Neck:     No masses; no thyromegaly Lungs:    Tachypnea, bilateral basal crackles CV:         Regular rate and rhythm; no murmurs Abd:      + bowel sounds; soft, non-tender; no palpable masses, no distension Ext:    No edema; adequate peripheral perfusion Skin:      Warm and dry; no rash Neuro: alert and oriented x 3 Psych: normal mood and affect   Data Reviewed: Imaging: CT chest 12/14/2019-pulmonary fibrosis, no significant gradient with honeycombing.  Clearly progressed since 2017. Probable UIP pattern.    Mammogram 12/26/2019-  Suspicious changes to the left nipple including small flesh-colored mass and retraction. Possibility of Paget's disease should be considered.   Density within the retroareolar left breast likely represents dense fibroglandular tissue.  Chest x-ray 10/09/21 - significant interval increase in coarse interstitial opacities bilaterally  High-res CT 11/20/2021-UIP fibrosis with progression compared to 2021  CT chest 03/23/2022- Small apical left pneumothorax, underlying pulmonary fibrosis in UIP pattern I had reviewed the images personally.   PFTs: 01/01/2022 FVC 0.99 [39%], FEV1 0.88 [47%], F/F 89, TLC 3.64 [67%], DLCO 7.50 [38%] Severe  restriction, diffusion defect  Labs:  Assessment:  Idiopathic pulmonary fibrosis CT shows progressive fibrosis in UIP pattern She does not have any exposures or connective tissue disease symptoms Findings are likely to be due to idiopathic pulmonary fibrosis  We had multiple discussions and she is not interested in treatment with antifibrotics or any other therapies.  Continue conservative management.  With increasing dyspnea I suspect progression of pulmonary fibrosis. Will also check a TSH level as she is taking double her dose of Synthroid and D-dimer to rule out pulmonary embolism She did not qualify for supplemental oxygen on walk today.  Check overnight oximetry  Goals of care discussion We discussed trajectory of disease and outcomes She has elected to be DNR  Plan/Recommendations: Check baseline labs, TSH, D-dimer Overnight oximetry  Marshell Garfinkel MD [facility] 05/11/2022, 9:24 AM  CC: Tonia Ghent, MD

## 2022-05-11 NOTE — ED Provider Notes (Signed)
[facility] DEPT Provider Note   CSN: [id number] Arrival date & time: 05/11/22  1959     History  Chief Complaint  Patient presents with   pulmonary embolism    Tara Harris is a 88 y.o. female.  HPI     This is an 88 year old female with a history of pulmonary fibrosis and hypothyroidism who presents with CT evidence of PE.  She was seen and evaluated by her pulmonologist earlier today with concern for worsening shortness of breath and dyspnea symptoms.  CT scan was obtained.  CT showed a submassive PE in addition to a residual 10% pneumothorax.  This is slightly increased from prior.  She had a notable pneumothorax onset chest CT on 10/17 following a fall.  She denies any leg swelling or history of blood clots.  Denies any recent hospitalizations, known history of cancer or any hypercoagulable risk factors.  Denies fevers or recent infectious symptoms.  Home Medications Prior to Admission medications   Medication Sig Start Date End Date Taking? Authorizing Provider  atorvastatin (LIPITOR) 10 MG tablet TAKE 1 TABLET EACH MORNING 02/02/22   Tonia Ghent, MD  Cholecalciferol (VITAMIN D) 50 MCG (2000 UT) CAPS Take 1 capsule (2,000 Units total) by mouth daily. Patient not taking: Reported on 05/11/2022 02/02/22   Tonia Ghent, MD  diclofenac Sodium (VOLTAREN) 1 % GEL Apply 4 g topically 4 (four) times daily. 03/23/22   Deno Etienne, DO  levothyroxine (SYNTHROID) 50 MCG tablet TAKE 50 MCG BY MOUTH DAILY ON EMPTY STOMACH. 04/28/22   Tonia Ghent, MD  mirtazapine (REMERON) 7.5 MG tablet TAKE 1 TABLET BY MOUTH AT BEDTIME. Patient not taking: Reported on 05/11/2022 04/28/22   Tonia Ghent, MD  Multiple Vitamins-Minerals (MULTIVITAMIN WITH MINERALS) tablet Take 1 tablet by mouth daily after breakfast.  Patient not taking: Reported on 05/11/2022    [provider]  polyethylene glycol (MIRALAX / GLYCOLAX) packet Take 17 g by mouth daily as  needed for mild constipation or moderate constipation.  Patient not taking: Reported on 05/11/2022    [provider]  sodium chloride 1 g tablet Take 1 tablet (1 g total) by mouth daily. 02/02/22   Tonia Ghent, MD      Allergies    Patient has no known allergies.    Review of Systems   Review of Systems  Constitutional:  Negative for fever.  Respiratory:  Positive for shortness of breath.   Cardiovascular:  Negative for chest pain and leg swelling.  All other systems reviewed and are negative.   Physical Exam Updated Vital Signs BP 119/69   Pulse 81   Temp (!) 97.2 F (36.2 C) (Temporal)   Resp (!) 33   Ht 1.702 m ('5\' 7"'$ )   Wt 38.1 kg   SpO2 96%   BMI 13.16 kg/m  Physical Exam Vitals and nursing note reviewed.  Constitutional:      Appearance: She is well-developed.     Comments: Elderly, chronically ill-appearing, no respiratory distress  HENT:     Head: Normocephalic and atraumatic.     Mouth/Throat:     Mouth: Mucous membranes are moist.  Eyes:     Pupils: Pupils are equal, round, and reactive to light.  Cardiovascular:     Rate and Rhythm: Normal rate and regular rhythm.     Heart sounds: Normal heart sounds.  Pulmonary:     Effort: Pulmonary effort is normal. No respiratory distress.     Breath  sounds: No wheezing.  Abdominal:     Palpations: Abdomen is soft.     Tenderness: There is abdominal tenderness. There is no guarding or rebound.  Musculoskeletal:     Cervical back: Neck supple.     Right lower leg: No edema.     Left lower leg: No edema.  Skin:    General: Skin is warm and dry.  Neurological:     Mental Status: She is alert and oriented to person, place, and time.  Psychiatric:        Mood and Affect: Mood normal.     ED Results / Procedures / Treatments   Labs (all labs ordered are listed, but only abnormal results are displayed) Labs Reviewed  CBC WITH DIFFERENTIAL/PLATELET - Abnormal; Notable for the following components:       Result Value   RBC 3.68 (*)    Hemoglobin 11.9 (*)    HCT 35.1 (*)    All other components within normal limits  COMPREHENSIVE METABOLIC PANEL - Abnormal; Notable for the following components:   Sodium 133 (*)    Chloride 92 (*)    Glucose, Bld 120 (*)    BUN 24 (*)    Creatinine, Ser 1.31 (*)    GFR, Estimated 39 (*)    All other components within normal limits    EKG None  Radiology CT Angio Chest W/Cm &/Or Wo Cm  Result Date: 05/11/2022 CLINICAL DATA:  Elevated D dimer and chest pain. EXAM: CT ANGIOGRAPHY CHEST WITH CONTRAST TECHNIQUE: Multidetector CT imaging of the chest was performed using the standard protocol during bolus administration of intravenous contrast. Multiplanar CT image reconstructions and MIPs were obtained to evaluate the vascular anatomy. RADIATION DOSE REDUCTION: This exam was performed according to the departmental dose-optimization program which includes automated exposure control, adjustment of the mA and/or kV according to patient size and/or use of iterative reconstruction technique. CONTRAST:  43m OMNIPAQUE IOHEXOL 350 MG/ML SOLN COMPARISON:  CT chest abdomen and pelvis 03/23/2022 FINDINGS: Cardiovascular: There is adequate opacification of pulmonary arteries. There are segmental and subsegmental pulmonary emboli within the right lower lobe. Aorta is normal in size. There is a no pericardial effusion. Heart is normal in size. There are atherosclerotic calcifications of the aorta. Mediastinum/Nodes: No enlarged mediastinal, hilar, or axillary lymph nodes. Thyroid gland, trachea, and esophagus demonstrate no significant findings. Lungs/Pleura: There is a small left pneumothorax (10%) which has mildly increased from prior. Fibrotic changes are seen peripherally throughout both lungs similar to prior examination. No focal pulmonary infarct or lung consolidation. No pleural effusion or pneumothorax. Upper Abdomen: No acute abnormality. 15 mm left renal cysts  present. Musculoskeletal: There is new mild compression fracture of T9 which appears acute or subacute. No retropulsion of fracture fragments. There are healed right-sided rib fractures. Review of the MIP images confirms the above findings. IMPRESSION: 1. Acute segmental and subsegmental pulmonary emboli in the right lower lobe. No evidence for right heart strain. Positive for acute PE with CT evidence of right heart strain (RV/LV Ratio = 1.2) consistent with at least submassive (intermediate risk) PE. The presence of right heart strain has been associated with an increased risk of morbidity and mortality. 2. Small left pneumothorax (10%), mildly increased from prior. 3. New acute or subacute T9 compression fracture. Aortic Atherosclerosis (ICD10-I70.0). These results were called by telephone at the time of interpretation on 05/11/2022 at 7:00 pm to [facility], who verbally acknowledged these results. Electronically Signed   By: AWarren Lacy  Dagoberto Reef M.D.   On: 05/11/2022 19:01    Procedures Procedures    Medications Ordered in ED Medications - No data to display  ED Course/ Medical Decision Making/ A&P                           Medical Decision Making Amount and/or Complexity of Data Reviewed Labs: ordered.  Risk Decision regarding hospitalization.   This patient presents to the ED for concern of shortness of breath, known PE, this involves an extensive number of treatment options, and is a complaint that carries with it a high risk of complications and morbidity.  I considered the following differential and admission for this acute, potentially life threatening condition.  The differential diagnosis includes known PE, multifactorial shortness of breath  MDM:    This is an 88 year old female who presents with known PE.  She had a CT scan earlier today.  She is nontoxic-appearing.  She is mildly tachypneic O2 sats are in the mid 90s at rest.  I reviewed her CT imaging which showed a  submassive PE.  She also has a residual 10% pneumothorax which was seen on prior CT scan.  I placed her on supplemental oxygen.  We will place her on heparin IV.  Plan for admission to the hospital.  Labs notable for AKI with a creatinine of 1.31.  (Labs, imaging, consults)  Labs: I Ordered, and personally interpreted labs.  The pertinent results include: CBC, BMP  Imaging Studies ordered: I ordered imaging studies including CT scan from earlier today I independently visualized and interpreted imaging. I agree with the radiologist interpretation  Additional history obtained from family at bedside.  External records from outside source obtained and reviewed including prior evaluations  Cardiac Monitoring: The patient was maintained on a cardiac monitor.  I personally viewed and interpreted the cardiac monitored which showed an underlying rhythm of: Sinus rhythm  Reevaluation: After the interventions noted above, I reevaluated the patient and found that they have :stayed the same  Social Determinants of Health:  lives with family, elderly  Disposition: Admit  Co morbidities that complicate the patient evaluation  Past Medical History:  Diagnosis Date   Arthritis    High cholesterol    Hypothyroidism    IBS (irritable bowel syndrome)    Menopause    Pulmonary fibrosis ([place])    Thyroid disease    Tubular adenoma 01/13/2007     Medicines No orders of the defined types were placed in this encounter.   I have reviewed the patients home medicines and have made adjustments as needed  Problem List / ED Course: Problem List Items Addressed This Visit   None Visit Diagnoses     Acute pulmonary embolism without acute cor pulmonale, unspecified pulmonary embolism type ([place])    -  Primary   AKI (acute kidney injury) ([place])                       Final Clinical Impression(s) / ED Diagnoses Final diagnoses:  Acute pulmonary embolism without acute cor pulmonale,  unspecified pulmonary embolism type ([place])  AKI (acute kidney injury) ([place])    Rx / DC Orders ED Discharge Orders     None         Merryl Hacker, MD 05/11/22 2337

## 2022-05-12 ENCOUNTER — Telehealth: Payer: Self-pay | Admitting: Pulmonary Disease

## 2022-05-12 ENCOUNTER — Encounter (HOSPITAL_COMMUNITY): Payer: Self-pay

## 2022-05-12 ENCOUNTER — Encounter (HOSPITAL_COMMUNITY): Payer: Self-pay | Admitting: Internal Medicine

## 2022-05-12 DIAGNOSIS — Z79890 Hormone replacement therapy (postmenopausal): Secondary | ICD-10-CM | POA: Diagnosis not present

## 2022-05-12 DIAGNOSIS — W19XXXA Unspecified fall, initial encounter: Secondary | ICD-10-CM | POA: Diagnosis present

## 2022-05-12 DIAGNOSIS — E7800 Pure hypercholesterolemia, unspecified: Secondary | ICD-10-CM | POA: Diagnosis present

## 2022-05-12 DIAGNOSIS — S22070A Wedge compression fracture of T9-T10 vertebra, initial encounter for closed fracture: Secondary | ICD-10-CM | POA: Diagnosis not present

## 2022-05-12 DIAGNOSIS — I5032 Chronic diastolic (congestive) heart failure: Secondary | ICD-10-CM | POA: Diagnosis present

## 2022-05-12 DIAGNOSIS — E785 Hyperlipidemia, unspecified: Secondary | ICD-10-CM

## 2022-05-12 DIAGNOSIS — I2699 Other pulmonary embolism without acute cor pulmonale: Secondary | ICD-10-CM | POA: Diagnosis present

## 2022-05-12 DIAGNOSIS — I2609 Other pulmonary embolism with acute cor pulmonale: Secondary | ICD-10-CM | POA: Diagnosis not present

## 2022-05-12 DIAGNOSIS — E43 Unspecified severe protein-calorie malnutrition: Secondary | ICD-10-CM | POA: Diagnosis present

## 2022-05-12 DIAGNOSIS — K589 Irritable bowel syndrome without diarrhea: Secondary | ICD-10-CM | POA: Diagnosis present

## 2022-05-12 DIAGNOSIS — J84112 Idiopathic pulmonary fibrosis: Secondary | ICD-10-CM

## 2022-05-12 DIAGNOSIS — J939 Pneumothorax, unspecified: Secondary | ICD-10-CM | POA: Diagnosis not present

## 2022-05-12 DIAGNOSIS — Z681 Body mass index (BMI) 19 or less, adult: Secondary | ICD-10-CM | POA: Diagnosis not present

## 2022-05-12 DIAGNOSIS — S270XXA Traumatic pneumothorax, initial encounter: Secondary | ICD-10-CM | POA: Diagnosis present

## 2022-05-12 DIAGNOSIS — Z8249 Family history of ischemic heart disease and other diseases of the circulatory system: Secondary | ICD-10-CM | POA: Diagnosis not present

## 2022-05-12 DIAGNOSIS — N179 Acute kidney failure, unspecified: Secondary | ICD-10-CM | POA: Diagnosis present

## 2022-05-12 DIAGNOSIS — I82413 Acute embolism and thrombosis of femoral vein, bilateral: Secondary | ICD-10-CM | POA: Diagnosis present

## 2022-05-12 DIAGNOSIS — E871 Hypo-osmolality and hyponatremia: Secondary | ICD-10-CM | POA: Diagnosis present

## 2022-05-12 DIAGNOSIS — E038 Other specified hypothyroidism: Secondary | ICD-10-CM

## 2022-05-12 DIAGNOSIS — E039 Hypothyroidism, unspecified: Secondary | ICD-10-CM | POA: Diagnosis present

## 2022-05-12 DIAGNOSIS — Z66 Do not resuscitate: Secondary | ICD-10-CM | POA: Diagnosis present

## 2022-05-12 DIAGNOSIS — Z79899 Other long term (current) drug therapy: Secondary | ICD-10-CM | POA: Diagnosis not present

## 2022-05-12 DIAGNOSIS — Z86711 Personal history of pulmonary embolism: Secondary | ICD-10-CM | POA: Diagnosis not present

## 2022-05-12 DIAGNOSIS — R634 Abnormal weight loss: Secondary | ICD-10-CM

## 2022-05-12 DIAGNOSIS — G8929 Other chronic pain: Secondary | ICD-10-CM | POA: Diagnosis present

## 2022-05-12 DIAGNOSIS — M4854XA Collapsed vertebra, not elsewhere classified, thoracic region, initial encounter for fracture: Secondary | ICD-10-CM | POA: Diagnosis present

## 2022-05-12 LAB — CBC WITH DIFFERENTIAL/PLATELET
Abs Immature Granulocytes: 0.03 10*3/uL (ref 0.00–0.07)
Basophils Absolute: 0.1 10*3/uL (ref 0.0–0.1)
Basophils Relative: 1 %
Eosinophils Absolute: 0.2 10*3/uL (ref 0.0–0.5)
Eosinophils Relative: 2 %
HCT: 31 % — ABNORMAL LOW (ref 36.0–46.0)
Hemoglobin: 10.9 g/dL — ABNORMAL LOW (ref 12.0–15.0)
Immature Granulocytes: 0 %
Lymphocytes Relative: 12 %
Lymphs Abs: 1 10*3/uL (ref 0.7–4.0)
MCH: 33.3 pg (ref 26.0–34.0)
MCHC: 35.2 g/dL (ref 30.0–36.0)
MCV: 94.8 fL (ref 80.0–100.0)
Monocytes Absolute: 1 10*3/uL (ref 0.1–1.0)
Monocytes Relative: 12 %
Neutro Abs: 5.7 10*3/uL (ref 1.7–7.7)
Neutrophils Relative %: 73 %
Platelets: 197 10*3/uL (ref 150–400)
RBC: 3.27 MIL/uL — ABNORMAL LOW (ref 3.87–5.11)
RDW: 13.4 % (ref 11.5–15.5)
WBC: 7.9 10*3/uL (ref 4.0–10.5)
nRBC: 0 % (ref 0.0–0.2)

## 2022-05-12 LAB — IRON AND TIBC
Iron: 35 ug/dL (ref 28–170)
Saturation Ratios: 17 % (ref 10.4–31.8)
TIBC: 211 ug/dL — ABNORMAL LOW (ref 250–450)
UIBC: 176 ug/dL

## 2022-05-12 LAB — HEPARIN LEVEL (UNFRACTIONATED)
Heparin Unfractionated: 0.51 IU/mL (ref 0.30–0.70)
Heparin Unfractionated: 0.58 IU/mL (ref 0.30–0.70)

## 2022-05-12 LAB — TROPONIN I (HIGH SENSITIVITY): Troponin I (High Sensitivity): 7 ng/L (ref <18)

## 2022-05-12 LAB — VITAMIN B12: Vitamin B-12: 774 pg/mL (ref 180–914)

## 2022-05-12 LAB — FERRITIN: Ferritin: 778 ng/mL — ABNORMAL HIGH (ref 11–307)

## 2022-05-12 LAB — PHOSPHORUS: Phosphorus: 3.1 mg/dL (ref 2.5–4.6)

## 2022-05-12 LAB — FOLATE: Folate: 18.7 ng/mL (ref >5.9)

## 2022-05-12 LAB — CK: Total CK: 87 U/L (ref 38–234)

## 2022-05-12 LAB — RETICULOCYTES
Immature Retic Fract: 3.2 % (ref 2.3–15.9)
RBC.: 3.3 MIL/uL — ABNORMAL LOW (ref 3.87–5.11)
Retic Count, Absolute: 34 10*3/uL (ref 19.0–186.0)
Retic Ct Pct: 1 % (ref 0.4–3.1)

## 2022-05-12 LAB — MAGNESIUM: Magnesium: 1.5 mg/dL — ABNORMAL LOW (ref 1.7–2.4)

## 2022-05-12 MED ORDER — LEVOTHYROXINE SODIUM 50 MCG PO TABS
50.0000 ug | ORAL_TABLET | Freq: Every day | ORAL | Status: DC
  Administered 2022-05-13 – 2022-05-20 (×8): 50 ug via ORAL
  Filled 2022-05-12 (×8): qty 1

## 2022-05-12 MED ORDER — ACETAMINOPHEN 650 MG RE SUPP: 650.0000 mg | Freq: Four times a day (QID) | RECTAL | Status: DC | PRN

## 2022-05-12 MED ORDER — ATORVASTATIN CALCIUM 10 MG PO TABS
10.0000 mg | ORAL_TABLET | Freq: Every day | ORAL | Status: DC
  Administered 2022-05-12 – 2022-05-19 (×8): 10 mg via ORAL
  Filled 2022-05-12 (×8): qty 1

## 2022-05-12 MED ORDER — ALPRAZOLAM 0.25 MG PO TABS
0.2500 mg | ORAL_TABLET | Freq: Two times a day (BID) | ORAL | Status: DC | PRN
  Administered 2022-05-13 – 2022-05-20 (×6): 0.25 mg via ORAL
  Filled 2022-05-12 (×6): qty 1

## 2022-05-12 MED ORDER — ACETAMINOPHEN 325 MG PO TABS
650.0000 mg | ORAL_TABLET | Freq: Four times a day (QID) | ORAL | Status: DC | PRN
  Administered 2022-05-16 – 2022-05-18 (×5): 650 mg via ORAL
  Filled 2022-05-12 (×5): qty 2

## 2022-05-12 MED ORDER — HYDROCODONE-ACETAMINOPHEN 5-325 MG PO TABS
1.0000 | ORAL_TABLET | ORAL | Status: DC | PRN
  Administered 2022-05-13: 1 via ORAL
  Administered 2022-05-13: 2 via ORAL
  Administered 2022-05-20 (×2): 1 via ORAL
  Filled 2022-05-12: qty 1
  Filled 2022-05-12: qty 2
  Filled 2022-05-12 (×2): qty 1

## 2022-05-12 MED ORDER — SODIUM CHLORIDE 0.9 % IV SOLN: INTRAVENOUS | Status: AC

## 2022-05-12 NOTE — ED Notes (Signed)
Report given to [facility]. 

## 2022-05-12 NOTE — Subjective & Objective (Signed)
Pt presented with known acute submassive PE found on out pt CTA with some right heart strain Her pulmonologist have ordered a CTA because she was having shortness of breath for the past 3 days she has history of past left apical pneumothorax in October 2023 trauma surgery has been consulted regarding that in the past and as she was treated with conservative medical management the recent CTA did show a residual left apical pneumothorax about 10% In emergency department normotensive no tachycardia vital stable not requiring any oxygen she was started on 2 L for comfort and heparin drip was started

## 2022-05-12 NOTE — Telephone Encounter (Signed)
Spoke with [facility]- call report on cxr dated 05/11/22  IMPRESSION: Small lateral left pneumothorax probably unchanged compared to prior exam.   These results will be called to the ordering clinician or representative by the Radiologist Assistant, and communication documented in the PACS or [employer].     Electronically Signed   By: Abelardo Diesel M.D.   On: 05/12/2022 13:14

## 2022-05-12 NOTE — Progress Notes (Signed)
ANTICOAGULATION CONSULT NOTE  Pharmacy Consult for heparin Indication: pulmonary embolus  No Known Allergies  Patient Measurements: Height: '5\' 7"'$  (170.2 cm) Weight: 38.1 kg (84 lb) IBW/kg (Calculated) : 61.6 Heparin Dosing Weight: TBW  Vital Signs: Temp: 97.6 F (36.4 C) (12/06 1855) Temp Source: Oral (12/06 1855) BP: 125/80 (12/06 1900) Pulse Rate: 88 (12/06 1900)  Labs: Recent Labs    05/11/22 1005 05/11/22 2158 05/12/22 0824 05/12/22 1820  HGB 12.8 11.9*  --   --   HCT 37.9 35.1*  --   --   PLT 303.0 266  --   --   HEPARINUNFRC  --   --  0.51 0.58  CREATININE 0.78 1.31*  --   --      Estimated Creatinine Clearance: 17.9 mL/min (A) (by C-G formula based on SCr of 1.31 mg/dL (H)).  Assessment: 43 YOF presenting with SOB, outpt CT with PE with RHS.  She is not on anticoagulation PTA. Follow up heparin level is still therapeutic at 0.58. No bleeding noted.   Goal of Therapy:  Heparin level 0.3-0.7 units/ml Monitor platelets by anticoagulation protocol: Yes   Plan:  Continue heparin gtt 650 units/hr Daily heparin level and CBC F/u long-term anticoagulation plan  Erin Hearing PharmD., BCPS Clinical Pharmacist 05/12/2022 7:50 PM

## 2022-05-12 NOTE — Assessment & Plan Note (Signed)
May need further work up for occult malignancy but will need to discuss with with PCP and family, assess nutritional status  Order prealbumin, nutritional consult

## 2022-05-12 NOTE — Progress Notes (Signed)
Hospitalist Transfer Note:  Transferring facility: [facility] Requesting provider: Dr. Dina Rich (EDP at [facility]) Reason for transfer: admission for further evaluation and management of acute pulmonary embolism.    88 year old female with pulmonary fibrosis, simple left apical pneumothorax, who presented to [facility] emergency department for further evaluation and management of outpatient CTA chest that found acute submassive pulmonary embolism with some evidence of right heart strain.  CTA chest was ordered by her outpatient pulmonologist, Dr. Vaughan Browner, as a consequence of to 3 days of progressive shortness of breath.  In the context of previously known left apical pneumothorax, originally diagnosed in October 2023, with trauma surgery consulted at that time recommending conservative medical management in the absence of chest tube.  In this context, the CTA chest also showed residual left apical pneumothorax, similar appearance to prior, approximately 10%.case has not been re-dicussed with trauma surgery at this time.  Vital signs stable in the emergency department, including normotensive blood pressure, no tachycardia, afebrile.  Additionally, the patient is needing oxygen saturations in the mid to high 90s on room air.  She was started on 2 L nasal cannula for patient comfort as opposed to any acute hypoxia.  Heparin drip initiated.   Subsequently, I accepted this patient for transfer for inpatient admission to a med-tele bed at [facility] for further work-up and management of the above .       Check [email or web address] for on-call coverage.   Nursing staff, Please call [place] number on Amion as soon as patient's arrival, so appropriate admitting provider can evaluate the pt.     Babs Bertin, DO Hospitalist

## 2022-05-12 NOTE — Assessment & Plan Note (Signed)
Patient will need to continue follow-up as outpatient with pulmonology

## 2022-05-12 NOTE — Telephone Encounter (Signed)
Closing encounter- Dr Vaughan Browner has already reviewed this and ordered further testing on pt. Nothing further needed.

## 2022-05-12 NOTE — Assessment & Plan Note (Signed)
Neck stable continue Lipitor 10 mg p.o. daily

## 2022-05-12 NOTE — ED Notes (Signed)
17:42 [name] at [facility] will send transport.- ABB (NS)

## 2022-05-12 NOTE — Assessment & Plan Note (Signed)
Order urine electrolytes gently rehydrate and follow renal status

## 2022-05-12 NOTE — H&P (Signed)
Tara Harris IRC:[id number] DOB: [birth year] DOA: 05/11/2022     PCP: Tonia Ghent, MD   Outpatient Specialists:     Pulmonary  Dr. Vaughan Browner    Patient arrived to ER on 05/11/22 at 1959 Referred by Attending Howerter, Ethelda Chick, DO   Patient coming from:    home Lives alone,   Chief Complaint:   Chief Complaint  Patient presents with   pulmonary embolism    HPI: Tara Harris is a 88 y.o. female with medical history significant of pulmonary fibrosis and hypothyroidism, pneumothorax, hyperlipidemia  Presented with   known PE based on outpatient CT scan Pt presented with known acute submassive PE found on out pt CTA with some right heart strain Her pulmonologist have ordered a CTA because she was having shortness of breath for the past 3 days she has history of past left apical pneumothorax in October 2023 trauma surgery has been consulted regarding that in the past and as she was treated with conservative medical management the recent CTA did show a residual left apical pneumothorax about 10% In emergency department normotensive no tachycardia vital stable not requiring any oxygen she was started on 2 L for comfort and heparin drip was started Never smoked She has chronic cough for past 2 to 3 years She has been is slightly erratic taking her thyroid medications resumed it about double the dose about a month ago She states she sometimes sees blood In her stool but that is very rare Family states this was due to hemorrhoids  No CP Had recent pneumothorax after fall conservatively managed  Pt has been losing a lot of weight but PCP felt it could have been due to  synthroid  She has been more sedentary recently Have had a  fall a few months ago    She had a breast biopsy done recently and was negative Regarding pertinent Chronic problems:     Hyperlipidemia -   on statins Lipitor (atorvastatin)  Lipid Panel     Component Value Date/Time   CHOL 144 12/01/2020  1219   CHOL 126 03/27/2015 1015   TRIG 127.0 12/01/2020 1219   HDL 46.90 12/01/2020 1219   HDL 50 03/27/2015 1015   CHOLHDL 3 12/01/2020 1219   VLDL 25.4 12/01/2020 1219   [place] 72 12/01/2020 1219   [place] 62 11/20/2018 0813   LDLDIRECT 186.6 05/21/2013 1151   LABVLDL 20 03/27/2015 1015      chronic CHF diastolic/ - last echo 5102 showing grade 1 diastolic dysfunction EF 58-52% mild mitral valve regurgitation mild aortic valve regurgitation and sclerosis     Hypothyroidism:  Lab Results  Component Value Date   TSH 1.63 05/11/2022   on synthroid     While in ER:   Started on heparin for provide known subsegmental PE   CXR - Small lateral left pneumothorax probably unchanged compared to prior exam.     CTA chest - 1. Acute segmental and subsegmental pulmonary emboli in the right lower lobe. No evidence for right heart strain. Positive for acute PE with CT evidence of right heart strain (RV/LV Ratio = 1.2) consistent with at least submassive (intermediate risk) PE. The presence of right heart strain has been associated with an increased risk of morbidity and mortality. 2. Small left pneumothorax (10%), mildly increased from prior. 3. New acute or subacute T9 compression fracture.  Following Medications were ordered in ER: Medications  heparin ADULT infusion 100 units/mL (25000 units/262m) (650 Units/hr Intravenous  New Bag/Given 05/12/22 0012)  heparin bolus via infusion 2,800 Units (2,800 Units Intravenous Bolus from Bag 05/12/22 0013)     ED Triage Vitals  Enc Vitals Group     BP 05/11/22 2059 113/64     Pulse Rate 05/11/22 2059 95     Resp 05/11/22 2059 18     Temp 05/11/22 2059 (!) 97.2 F (36.2 C)     Temp Source 05/11/22 2059 Temporal     SpO2 05/11/22 2059 99 %     Weight 05/11/22 2101 84 lb (38.1 kg)     Height 05/11/22 2101 '5\' 7"'$  (1.702 m)     Head Circumference --      Peak Flow --      Pain Score 05/11/22 2100 9     Pain Loc --      Pain Edu? --       Excl. in [place]? --   TMAX(24)@     _________________________________________ Significant initial  Findings: Abnormal Labs Reviewed  CBC WITH DIFFERENTIAL/PLATELET - Abnormal; Notable for the following components:      Result Value   RBC 3.68 (*)    Hemoglobin 11.9 (*)    HCT 35.1 (*)    All other components within normal limits  COMPREHENSIVE METABOLIC PANEL - Abnormal; Notable for the following components:   Sodium 133 (*)    Chloride 92 (*)    Glucose, Bld 120 (*)    BUN 24 (*)    Creatinine, Ser 1.31 (*)    GFR, Estimated 39 (*)    All other components within normal limits    _________________________ Troponin  ordered ECG: Ordered Personally reviewed and interpreted by me showing: HR : 85 Rhythm: Sinus rhythm Abnormal R-wave progression, late transition Left ventricular hypertrophy QTC 411   The recent clinical data is shown below. Vitals:   05/12/22 1845 05/12/22 1855 05/12/22 1900 05/12/22 1955  BP:   125/80 108/73  Pulse:   88 84  Resp:   20 18  Temp: 98.2 F (36.8 C) 97.6 F (36.4 C)  98.5 F (36.9 C)  TempSrc: Oral Oral  Oral  SpO2:   99% 98%  Weight:      Height:        WBC     Component Value Date/Time   WBC 8.8 05/11/2022 2158   LYMPHSABS 1.0 05/11/2022 2158   LYMPHSABS 1.6 03/27/2015 1015   MONOABS 0.7 05/11/2022 2158   EOSABS 0.1 05/11/2022 2158   EOSABS 0.2 03/27/2015 1015   BASOSABS 0.1 05/11/2022 2158   BASOSABS 0.0 03/27/2015 1015      UA ordered   _____________________________________ Hospitalist was called for admission for   Acute pulmonary embolism without acute cor pulmonale,  AKI     The following Work up has been ordered so far:  Orders Placed This Encounter  Procedures   CBC with Differential   Comprehensive metabolic panel   Heparin level (unfractionated)   Heparin level (unfractionated)   Cardiac monitoring   heparin per pharmacy consult   Consult to hospitalist   Oxygen therapy Liters Per Minute: 2   Admit to  Inpatient (patient's expected length of stay will be greater than 2 midnights or inpatient only procedure)     OTHER Significant initial  Findings:  labs showing:   Recent Labs  Lab 05/11/22 1005 05/11/22 2158  NA 134* 133*  K 4.2 4.2  CO2 32 31  GLUCOSE 115* 120*  BUN 17 24*  CREATININE 0.78 1.31*  CALCIUM 10.2 10.1    Cr   Up from baseline see below Lab Results  Component Value Date   CREATININE 1.31 (H) 05/11/2022   CREATININE 0.78 05/11/2022   CREATININE 0.56 03/23/2022    Recent Labs  Lab 05/11/22 1005 05/11/22 2158  AST 23 21  ALT 16 14  ALKPHOS 66 60  BILITOT 1.4* 1.1  PROT 8.2 7.8  ALBUMIN 4.3 4.2   Lab Results  Component Value Date   CALCIUM 10.1 05/11/2022   PHOS 3.6 02/17/2020    Plt: Lab Results  Component Value Date   PLT 266 05/11/2022      COVID-19 Labs  Recent Labs    05/11/22 1005  DDIMER 3.51*    Lab Results  Component Value Date   SARSCOV2NAA NEGATIVE 06/13/2020   [place] NEGATIVE 02/17/2020     Recent Labs  Lab 05/11/22 1005 05/11/22 2158  WBC 8.9 8.8  NEUTROABS 6.6 6.9  HGB 12.8 11.9*  HCT 37.9 35.1*  MCV 95.7 95.4  PLT 303.0 266    HG/HCT stable     Component Value Date/Time   HGB 11.9 (L) 05/11/2022 2158   HGB 12.4 03/27/2015 1015   HCT 35.1 (L) 05/11/2022 2158   HCT 36.6 03/27/2015 1015   MCV 95.4 05/11/2022 2158   MCV 93 03/27/2015 1015        Cultures: No results found for: "SDES", "SPECREQUEST", "CULT", "REPTSTATUS"   Radiological Exams on Admission: DG Chest 2 View  Result Date: 05/12/2022 CLINICAL DATA:  Shortness of breath and cough. EXAM: CHEST - 2 VIEW COMPARISON:  March 23, 2022 FINDINGS: The heart size and mediastinal contours are stable. Small lateral left pneumothorax probably unchanged compared to prior exam. Chronic increased interstitial markings/fibrosis identified throughout bilateral lungs. The visualized skeletal structures are stable. Scoliosis of spine. IMPRESSION: Small  lateral left pneumothorax probably unchanged compared to prior exam. These results will be called to the ordering clinician or representative by the Radiologist Assistant, and communication documented in the PACS or [employer]. Electronically Signed   By: Abelardo Diesel M.D.   On: 05/12/2022 13:14   CT Angio Chest W/Cm &/Or Wo Cm  Result Date: 05/11/2022 CLINICAL DATA:  Elevated D dimer and chest pain. EXAM: CT ANGIOGRAPHY CHEST WITH CONTRAST TECHNIQUE: Multidetector CT imaging of the chest was performed using the standard protocol during bolus administration of intravenous contrast. Multiplanar CT image reconstructions and MIPs were obtained to evaluate the vascular anatomy. RADIATION DOSE REDUCTION: This exam was performed according to the departmental dose-optimization program which includes automated exposure control, adjustment of the mA and/or kV according to patient size and/or use of iterative reconstruction technique. CONTRAST:  15m OMNIPAQUE IOHEXOL 350 MG/ML SOLN COMPARISON:  CT chest abdomen and pelvis 03/23/2022 FINDINGS: Cardiovascular: There is adequate opacification of pulmonary arteries. There are segmental and subsegmental pulmonary emboli within the right lower lobe. Aorta is normal in size. There is a no pericardial effusion. Heart is normal in size. There are atherosclerotic calcifications of the aorta. Mediastinum/Nodes: No enlarged mediastinal, hilar, or axillary lymph nodes. Thyroid gland, trachea, and esophagus demonstrate no significant findings. Lungs/Pleura: There is a small left pneumothorax (10%) which has mildly increased from prior. Fibrotic changes are seen peripherally throughout both lungs similar to prior examination. No focal pulmonary infarct or lung consolidation. No pleural effusion or pneumothorax. Upper Abdomen: No acute abnormality. 15 mm left renal cysts present. Musculoskeletal: There is new mild compression fracture of T9 which appears acute or subacute. No  retropulsion of  fracture fragments. There are healed right-sided rib fractures. Review of the MIP images confirms the above findings. IMPRESSION: 1. Acute segmental and subsegmental pulmonary emboli in the right lower lobe. No evidence for right heart strain. Positive for acute PE with CT evidence of right heart strain (RV/LV Ratio = 1.2) consistent with at least submassive (intermediate risk) PE. The presence of right heart strain has been associated with an increased risk of morbidity and mortality. 2. Small left pneumothorax (10%), mildly increased from prior. 3. New acute or subacute T9 compression fracture. Aortic Atherosclerosis (ICD10-I70.0). These results were called by telephone at the time of interpretation on 05/11/2022 at 7:00 pm to provider [facility] , who verbally acknowledged these results. Electronically Signed   By: Ronney Asters M.D.   On: 05/11/2022 19:01   _______________________________________________________________________________________________________ Latest  Blood pressure 108/73, pulse 84, temperature 98.5 F (36.9 C), temperature source Oral, resp. rate 18, height '5\' 7"'$  (1.702 m), weight 38.1 kg, SpO2 98 %.   Vitals  labs and radiology finding personally reviewed  Review of Systems:    Pertinent positives include:  fatigue, shortness of breath at rest.  dyspnea on exertion,  Constitutional:  No weight loss, night sweats, Fevers, chills,  weight loss  HEENT:  No headaches, Difficulty swallowing,Tooth/dental problems,Sore throat,  No sneezing, itching, ear ache, nasal congestion, post nasal drip,  Cardio-vascular:  No chest pain, Orthopnea, PND, anasarca, dizziness, palpitations.no Bilateral lower extremity swelling  GI:  No heartburn, indigestion, abdominal pain, nausea, vomiting, diarrhea, change in bowel habits, loss of appetite, melena, blood in stool, hematemesis Resp:  no  No excess mucus, no productive cough, No non-productive cough, No coughing up of  blood.No change in color of mucus.No wheezing. Skin:  no rash or lesions. No jaundice GU:  no dysuria, change in color of urine, no urgency or frequency. No straining to urinate.  No flank pain.  Musculoskeletal:  No joint pain or no joint swelling. No decreased range of motion. No back pain.  Psych:  No change in mood or affect. No depression or anxiety. No memory loss.  Neuro: no localizing neurological complaints, no tingling, no weakness, no double vision, no gait abnormality, no slurred speech, no confusion  All systems reviewed and apart from August all are negative _______________________________________________________________________________________________ Past Medical History:   Past Medical History:  Diagnosis Date   Arthritis    High cholesterol    Hypothyroidism    IBS (irritable bowel syndrome)    Menopause    Pulmonary fibrosis (HCC)    Thyroid disease    Tubular adenoma 01/13/2007     Past Surgical History:  Procedure Laterality Date   biopsy left leg     BREAST CYST EXCISION Left 06/17/2020   Procedure: LEFT CENTRAL NIPPLE EXCISION;  Surgeon: Donnie Mesa, MD;  Location: [place];  Service: General;  Laterality: Left;   CATARACT EXTRACTION, BILATERAL  2012   COLONOSCOPY     TONSILLECTOMY      Social History:  Ambulatory  walker         reports that she has never smoked. She has never used smokeless tobacco. She reports that she does not drink alcohol and does not use drugs.     Family History:  Family History  Problem Relation Age of Onset   Alzheimer's disease Mother    Heart attack Father    Alzheimer's disease Father    Heart attack Sister    Kidney disease Brother    Colon cancer Neg  Hx    Stomach cancer Neg Hx    Breast cancer Neg Hx    ______________________________________________________________________________________________ Allergies: No Known Allergies   Prior to Admission medications   Medication Sig Start Date  End Date Taking? Authorizing Provider  atorvastatin (LIPITOR) 10 MG tablet TAKE 1 TABLET EACH MORNING 02/02/22  Yes Tonia Ghent, MD  Cholecalciferol (VITAMIN D) 50 MCG (2000 UT) CAPS Take 1 capsule (2,000 Units total) by mouth daily. 02/02/22  Yes Tonia Ghent, MD  levothyroxine (SYNTHROID) 50 MCG tablet TAKE 50 MCG BY MOUTH DAILY ON EMPTY STOMACH. 04/28/22  Yes Tonia Ghent, MD  diclofenac Sodium (VOLTAREN) 1 % GEL Apply 4 g topically 4 (four) times daily. Patient not taking: Reported on 05/12/2022 03/23/22   Deno Etienne, DO  mirtazapine (REMERON) 7.5 MG tablet TAKE 1 TABLET BY MOUTH AT BEDTIME. Patient not taking: Reported on 05/11/2022 04/28/22   Tonia Ghent, MD  sodium chloride 1 g tablet Take 1 tablet (1 g total) by mouth daily. Patient not taking: Reported on 05/12/2022 02/02/22   Tonia Ghent, MD    ___________________________________________________________________________________________________ Physical Exam:    05/12/2022    7:55 PM 05/12/2022    7:00 PM 05/12/2022    6:31 PM  Vitals with BMI  Systolic 081 448   Diastolic 73 80   Pulse 84 88 87    1. General:  in No  Acute distress    Chronically ill cachectic -appearing 2. Psychological: Alert and   Oriented 3. Head/ENT:   Dry Mucous Membranes                          Head Non traumatic, neck supple                          Poor Dentition 4. SKIN:  decreased Skin turgor,  Skin clean Dry and intact no rash 5. Heart: Regular rate and rhythm no  Murmur, no Rub or gallop 6. Lungs:  no wheezes mild crackles   7. Abdomen: Soft,  non-tender, Non distended bowel sounds present 8. Lower extremities: no clubbing, cyanosis, no  edema 9. Neurologically Grossly intact, moving all 4 extremities equally   10. MSK: Normal range of motion    Chart has been reviewed  ______________________________________________________________________________________________  Assessment/Plan  88 y.o. female with medical history  significant of pulmonary fibrosis and hypothyroidism, pneumothorax, hyperlipidemia   Admitted for   Acute pulmonary embolism without acute cor pulmonale, unspecified pulmonary embolism type  AKI    Present on Admission:  Acute pulmonary embolism (HCC)  Hyperlipidemia  Hypothyroidism  IPF (idiopathic pulmonary fibrosis) (HCC)  AKI (acute kidney injury) (HCC)  Pneumothorax  Chronic diastolic CHF (congestive heart failure) (HCC)  Compression fracture of T9 vertebra (HCC)     Acute pulmonary embolism (HCC)  Admit to   telemetry   stepdown Initiate heparin drip  Would likely benefit from case manager consult for long term anticoagulation Hold home blood pressure medications avoid hypotension Cycle cardiac enzymes Order echogram and lower extremity Dopplers     Hyperlipidemia Neck stable continue Lipitor 10 mg p.o. daily  Hypothyroidism TSH within normal limits continue Synthroid at 50  micrograms p.o. daily  IPF (idiopathic pulmonary fibrosis) ([place]) Patient will need to continue follow-up as outpatient with pulmonology  AKI (acute kidney injury) ([place]) Order urine electrolytes gently rehydrate and follow renal status  Pneumothorax Stable continue supportive management  Chronic diastolic CHF (  congestive heart failure) (HCC) Chronic stable, monitor fluid status  Now dry  Compression fracture of T9 vertebra (HCC) Has chronic pain, supportive managment   Other plan as per orders.  DVT prophylaxis:  heparin     Code Status:  DNR/DNI   as per patient  I had personally discussed CODE STATUS with patient    Family Communication:   Family not at  Bedside    Disposition Plan: To home once workup is complete and patient is stable   Following barriers for discharge:                                                        Will need consultants to evaluate patient prior to discharge                     Transition of care consulted                   Nutrition    consulted              Consults called: pulmonology is aware pt is being admitted    Admission status:  ED Disposition     ED Disposition  [place]: [place] [[id number]]  Level of Care: Telemetry Medical [[id number]]  May admit patient to [facility] or [facility] if equivalent level of care is available:: No  Interfacility transfer: Yes  Covid Evaluation: Asymptomatic - no recent exposure (last 10 days) testing not required  Diagnosis: Acute pulmonary embolism [facility]) [[id number]]  Admitting Physician: Rhetta Mura [[id number]]  Attending Physician: Rhetta Mura [[id number]]  Certification:: I certify this patient will need inpatient services for at least 2 midnights  Estimated Length of Stay: 2           inpatient     I Expect 2 midnight stay secondary to severity of patient's current illness need for inpatient interventions justified by the following:   Severe lab/radiological/exam abnormalities including:    AKI and PE and extensive comorbidities including:  CHF   That are currently affecting medical management.   I expect  patient to be hospitalized for 2 midnights requiring inpatient medical care.  Patient is at high risk for adverse outcome (such as loss of life or disability) if not treated.  Indication for inpatient stay as follows:    Need for   IV fluids, IV heparin    Level of care     tele  For  24H      Lab Results  Component Value Date   [place] NEGATIVE 06/13/2020     Tara Harris 05/12/2022, 9:08 PM    [facility]     after 2 AM please page floor coverage PA If 7AM-7PM, please contact the day team taking care of the patient using [email or web address]   Patient was evaluated in the context of the global COVID-19 pandemic, which necessitated consideration that the patient might be at risk for infection with the SARS-CoV-2 virus that causes COVID-19. Institutional protocols and algorithms that pertain to  the evaluation of patients at risk for COVID-19 are in a state of rapid change based on information released by regulatory bodies including the CDC and federal and state organizations. These policies and algorithms  were followed during the patient's care.

## 2022-05-12 NOTE — ED Notes (Signed)
Report attempted to the floor RN but RN was unable to take report.

## 2022-05-12 NOTE — Assessment & Plan Note (Signed)
Stable continue supportive management

## 2022-05-12 NOTE — Assessment & Plan Note (Signed)
Chronic stable, monitor fluid status  Now dry

## 2022-05-12 NOTE — Assessment & Plan Note (Signed)
Admit to   telemetry   stepdown Initiate heparin drip  Would likely benefit from case manager consult for long term anticoagulation Hold home blood pressure medications avoid hypotension Cycle cardiac enzymes Order echogram and lower extremity Dopplers

## 2022-05-12 NOTE — Progress Notes (Signed)
ANTICOAGULATION CONSULT NOTE  Pharmacy Consult for heparin Indication: pulmonary embolus  No Known Allergies  Patient Measurements: Height: '5\' 7"'$  (170.2 cm) Weight: 38.1 kg (84 lb) IBW/kg (Calculated) : 61.6 Heparin Dosing Weight: TBW  Vital Signs: Temp: 98.5 F (36.9 C) (12/06 0643) Temp Source: Oral (12/06 0643) BP: 136/74 (12/06 1000) Pulse Rate: 74 (12/06 1000)  Labs: Recent Labs    05/11/22 1005 05/11/22 2158 05/12/22 0824  HGB 12.8 11.9*  --   HCT 37.9 35.1*  --   PLT 303.0 266  --   HEPARINUNFRC  --   --  0.51  CREATININE 0.78 1.31*  --      Estimated Creatinine Clearance: 17.9 mL/min (A) (by C-G formula based on SCr of 1.31 mg/dL (H)).  Assessment: 83 YOF presenting with SOB, outpt CT with PE with RHS.  She is not on anticoagulation PTA. Initial heparin level is therapeutic at 0.51. No bleeding noted.   Goal of Therapy:  Heparin level 0.3-0.7 units/ml Monitor platelets by anticoagulation protocol: Yes   Plan:  Continue heparin gtt 650 units/hr Repeat heparin level tonight Daily heparin level and CBC F/u long-term anticoagulation plan  Salome Arnt, PharmD, BCPS, BCEMP Clinical Pharmacist Please see AMION for all pharmacy numbers 05/12/2022 10:29 AM

## 2022-05-12 NOTE — Assessment & Plan Note (Signed)
Has chronic pain, supportive managment

## 2022-05-12 NOTE — ED Notes (Signed)
17:53 [name] @ [facility] will send transport.-ABB (NS)

## 2022-05-12 NOTE — Telephone Encounter (Signed)
Tara Harris has call report.  Transferred to Tara Harris.

## 2022-05-12 NOTE — ED Notes (Signed)
Lab contacted and they are contacting the courier for the lab draw of heparin level.

## 2022-05-12 NOTE — Assessment & Plan Note (Addendum)
TSH within normal limits continue Synthroid at 50  micrograms p.o. daily

## 2022-05-13 ENCOUNTER — Inpatient Hospital Stay (HOSPITAL_COMMUNITY): Payer: Medicare Other

## 2022-05-13 ENCOUNTER — Other Ambulatory Visit (HOSPITAL_COMMUNITY): Payer: Self-pay

## 2022-05-13 ENCOUNTER — Other Ambulatory Visit (HOSPITAL_COMMUNITY): Payer: Medicare Other

## 2022-05-13 DIAGNOSIS — J84112 Idiopathic pulmonary fibrosis: Secondary | ICD-10-CM | POA: Diagnosis not present

## 2022-05-13 DIAGNOSIS — I5032 Chronic diastolic (congestive) heart failure: Secondary | ICD-10-CM | POA: Diagnosis not present

## 2022-05-13 DIAGNOSIS — J939 Pneumothorax, unspecified: Secondary | ICD-10-CM | POA: Diagnosis not present

## 2022-05-13 DIAGNOSIS — E43 Unspecified severe protein-calorie malnutrition: Secondary | ICD-10-CM | POA: Insufficient documentation

## 2022-05-13 DIAGNOSIS — I2609 Other pulmonary embolism with acute cor pulmonale: Secondary | ICD-10-CM | POA: Diagnosis not present

## 2022-05-13 DIAGNOSIS — I2699 Other pulmonary embolism without acute cor pulmonale: Secondary | ICD-10-CM

## 2022-05-13 DIAGNOSIS — S22070A Wedge compression fracture of T9-T10 vertebra, initial encounter for closed fracture: Secondary | ICD-10-CM | POA: Diagnosis not present

## 2022-05-13 LAB — COMPREHENSIVE METABOLIC PANEL
ALT: 16 U/L (ref 0–44)
AST: 23 U/L (ref 15–41)
Albumin: 2.8 g/dL — ABNORMAL LOW (ref 3.5–5.0)
Alkaline Phosphatase: 42 U/L (ref 38–126)
Anion gap: 7 (ref 5–15)
BUN: 14 mg/dL (ref 8–23)
CO2: 27 mmol/L (ref 22–32)
Calcium: 8.2 mg/dL — ABNORMAL LOW (ref 8.9–10.3)
Chloride: 98 mmol/L (ref 98–111)
Creatinine, Ser: 0.61 mg/dL (ref 0.44–1.00)
GFR, Estimated: >60 mL/min (ref >60)
Glucose, Bld: 127 mg/dL — ABNORMAL HIGH (ref 70–99)
Potassium: 3.5 mmol/L (ref 3.5–5.1)
Sodium: 132 mmol/L — ABNORMAL LOW (ref 135–145)
Total Bilirubin: 1.1 mg/dL (ref 0.3–1.2)
Total Protein: 6 g/dL — ABNORMAL LOW (ref 6.5–8.1)

## 2022-05-13 LAB — CBC
HCT: 28.6 % — ABNORMAL LOW (ref 36.0–46.0)
Hemoglobin: 10.1 g/dL — ABNORMAL LOW (ref 12.0–15.0)
MCH: 33.3 pg (ref 26.0–34.0)
MCHC: 35.3 g/dL (ref 30.0–36.0)
MCV: 94.4 fL (ref 80.0–100.0)
Platelets: 161 10*3/uL (ref 150–400)
RBC: 3.03 MIL/uL — ABNORMAL LOW (ref 3.87–5.11)
RDW: 13.5 % (ref 11.5–15.5)
WBC: 7.1 10*3/uL (ref 4.0–10.5)
nRBC: 0 % (ref 0.0–0.2)

## 2022-05-13 LAB — PREALBUMIN: Prealbumin: 12 mg/dL — ABNORMAL LOW (ref 18–38)

## 2022-05-13 LAB — ECHOCARDIOGRAM COMPLETE
Area-P 1/2: 3.6 cm2
Height: 67 in
S' Lateral: 2.5 cm
Single Plane A4C EF: 49 %
Weight: 1344 oz

## 2022-05-13 LAB — TROPONIN I (HIGH SENSITIVITY): Troponin I (High Sensitivity): 6 ng/L (ref <18)

## 2022-05-13 LAB — MAGNESIUM: Magnesium: 2 mg/dL (ref 1.7–2.4)

## 2022-05-13 LAB — HEPARIN LEVEL (UNFRACTIONATED): Heparin Unfractionated: 0.62 IU/mL (ref 0.30–0.70)

## 2022-05-13 LAB — PHOSPHORUS: Phosphorus: 3.2 mg/dL (ref 2.5–4.6)

## 2022-05-13 MED ORDER — ADULT MULTIVITAMIN W/MINERALS CH
1.0000 | ORAL_TABLET | Freq: Every day | ORAL | Status: DC
  Administered 2022-05-13 – 2022-05-20 (×8): 1 via ORAL
  Filled 2022-05-13 (×9): qty 1

## 2022-05-13 MED ORDER — MAGNESIUM SULFATE IN D5W 1-5 GM/100ML-% IV SOLN
1.0000 g | Freq: Once | INTRAVENOUS | Status: AC
  Administered 2022-05-13: 1 g via INTRAVENOUS
  Filled 2022-05-13: qty 100

## 2022-05-13 MED ORDER — ENSURE ENLIVE PO LIQD
237.0000 mL | Freq: Two times a day (BID) | ORAL | Status: DC
  Administered 2022-05-14 – 2022-05-20 (×12): 237 mL via ORAL

## 2022-05-13 NOTE — Consult Note (Signed)
NAME:  Tara Harris, MRN:  [id number], DOB:  07/12/1931, LOS: 1 ADMISSION DATE:  05/11/2022, CONSULTATION DATE: 05/13/2022 REFERRING MD: [facility], CHIEF COMPLAINT: Right lower lobe pulmonary embolism  History of Present Illness:  88 year old female with a history of IPF who has declined treatment per Dr. Kimber Relic.  She also has a known left 10% pneumothorax from fall is being monitored and treated conservatively.  She was seen by Dr. Vaughan Browner for IPF CTA was ordered which demonstrated a new right lower lobe pulmonary embolisms along with continued pneumothorax on the left.  She is admitted to [facility] by the hospitalist service started on heparin drip CT scan did show some heart strain.  Pulmonary critical care asked to evaluate.  Of note she was given Norco x 2 and Xanax during the night and during examination was quite somnolent.  Pertinent  Medical History   Past Medical History:  Diagnosis Date   Arthritis    High cholesterol    Hypothyroidism    IBS (irritable bowel syndrome)    Menopause    Pulmonary fibrosis (HCC)    Thyroid disease    Tubular adenoma 01/13/2007     Significant Hospital Events: Including procedures, antibiotic start and stop dates in addition to other pertinent events   Continues to have a left pneumothorax is being monitored  Interim History / Subjective:  88 year old female with new diagnosis of right lower lobe pulmonary embolism he is currently on heparin drip and in no acute distress hemodynamically stable.  Objective   Blood pressure (!) 148/93, pulse 78, temperature 98.5 F (36.9 C), temperature source Oral, resp. rate 17, height '5\' 7"'$  (1.702 m), weight 38.1 kg, SpO2 100 %.        Intake/Output Summary (Last 24 hours) at 05/13/2022 0900 Last data filed at 05/13/2022 0256 Gross per 24 hour  Intake 510.49 ml  Output --  Net 510.49 ml   Filed Weights   05/11/22 2101  Weight: 38.1 kg    Examination: General: Elderly female no acute  distress at rest HENT: No JVD or lymphadenopathy appreciated Lungs: Diminished breath sounds throughout Cardiovascular: Heart sounds are distant Abdomen: Abdomen is soft nontender Extremities: Warm dry without edema Neuro: Arouses to strong verbal stimuli but is lethargic   Resolved Hospital Problem list     Assessment & Plan:  New right lower lobe pulmonary embolism with evidence of heart strain. Agree with heparin drip Evaluate need for lytics Consider lower extremity Doppler study Hold narcotics and benzodiazepines as currently somnolent.  IPF with restrictive disease and poor diffusion followed by Dr. Vaughan Browner. Has declined treatment for IPF Currently a DNR  History of 10 to 15% left pneumothorax still present on CT scan Continue to monitor and treat conservatively  Hypothyroidism Per primary which she is receiving.  She Norco and Xanax 0 She was she was getting blood drawn while talking to her but unit   Best Practice (right click and "Reselect all SmartList Selections" daily)   Diet/type: Regular consistency (see orders) DVT prophylaxis: systemic heparin GI prophylaxis: PPI Lines: N/A Foley:  N/A Code Status:  DNR Last date of multidisciplinary goals of care discussion [tbd]  Labs   CBC: Recent Labs  Lab 05/11/22 1005 05/11/22 2158 05/12/22 2050 05/13/22 0336  WBC 8.9 8.8 7.9 7.1  NEUTROABS 6.6 6.9 5.7  --   HGB 12.8 11.9* 10.9* 10.1*  HCT 37.9 35.1* 31.0* 28.6*  MCV 95.7 95.4 94.8 94.4  PLT 303.0 266 197 161  Basic Metabolic Panel: Recent Labs  Lab 05/11/22 1005 05/11/22 2158 05/12/22 2050 05/13/22 0336  NA 134* 133*  --  132*  K 4.2 4.2  --  3.5  CL 94* 92*  --  98  CO2 32 31  --  27  GLUCOSE 115* 120*  --  127*  BUN 17 24*  --  14  CREATININE 0.78 1.31*  --  0.61  CALCIUM 10.2 10.1  --  8.2*  MG  --   --  1.5* 2.0  PHOS  --   --  3.1 3.2   GFR: Estimated Creatinine Clearance: 29.2 mL/min (by C-G formula based on SCr of 0.61  mg/dL). Recent Labs  Lab 05/11/22 1005 05/11/22 2158 05/12/22 2050 05/13/22 0336  WBC 8.9 8.8 7.9 7.1    Liver Function Tests: Recent Labs  Lab 05/11/22 1005 05/11/22 2158 05/13/22 0336  AST '23 21 23  '$ ALT '16 14 16  '$ ALKPHOS 66 60 42  BILITOT 1.4* 1.1 1.1  PROT 8.2 7.8 6.0*  ALBUMIN 4.3 4.2 2.8*   No results for input(s): "LIPASE", "AMYLASE" in the last 168 hours. No results for input(s): "AMMONIA" in the last 168 hours.  ABG No results found for: "PHART", "PCO2ART", "PO2ART", "HCO3", "TCO2", "ACIDBASEDEF", "O2SAT"   Coagulation Profile: No results for input(s): "INR", "PROTIME" in the last 168 hours.  Cardiac Enzymes: Recent Labs  Lab 05/12/22 2050  CKTOTAL 87    HbA1C: Hgb A1c MFr Bld  Date/Time Value Ref Range Status  11/20/2018 08:13 AM 5.9 (H) <5.7 % of total Hgb Final    Comment:    For someone without known diabetes, a hemoglobin  A1c value between 5.7% and 6.4% is consistent with prediabetes and should be confirmed with a  follow-up test. . For someone with known diabetes, a value <7% indicates that their diabetes is well controlled. A1c targets should be individualized based on duration of diabetes, age, comorbid conditions, and other considerations. . This assay result is consistent with an increased risk of diabetes. . Currently, no consensus exists regarding use of hemoglobin A1c for diagnosis of diabetes for children. Marland Kitchen   11/22/2017 08:11 AM 5.6 <5.7 % of total Hgb Final    Comment:    For the purpose of screening for the presence of diabetes: . <5.7%       Consistent with the absence of diabetes 5.7-6.4%    Consistent with increased risk for diabetes             (prediabetes) > or =6.5%  Consistent with diabetes . This assay result is consistent with a decreased risk of diabetes. . Currently, no consensus exists regarding use of hemoglobin A1c for diagnosis of diabetes in children. . According to American Diabetes Association  (ADA) guidelines, hemoglobin A1c <7.0% represents optimal control in non-pregnant diabetic patients. Different metrics may apply to specific patient populations.  Standards of Medical Care in Diabetes(ADA). .     CBG: No results for input(s): "GLUCAP" in the last 168 hours.  Review of Systems:   Na due somulence  Past Medical History:  She,  has a past medical history of Arthritis, High cholesterol, Hypothyroidism, IBS (irritable bowel syndrome), Menopause, Pulmonary fibrosis ([place]), Thyroid disease, and Tubular adenoma (01/13/2007).   Surgical History:   Past Surgical History:  Procedure Laterality Date   biopsy left leg     BREAST CYST EXCISION Left 06/17/2020   Procedure: LEFT CENTRAL NIPPLE EXCISION;  Surgeon: Donnie Mesa, MD;  Location: [facility];  Service: General;  Laterality: Left;   CATARACT EXTRACTION, BILATERAL  2012   COLONOSCOPY     TONSILLECTOMY       Social History:   reports that she has never smoked. She has never used smokeless tobacco. She reports that she does not drink alcohol and does not use drugs.   Family History:  Her family history includes Alzheimer's disease in her father and mother; Heart attack in her father and sister; Kidney disease in her brother. There is no history of Colon cancer, Stomach cancer, or Breast cancer.   Allergies No Known Allergies   Home Medications  Prior to Admission medications   Medication Sig Start Date End Date Taking? Authorizing Provider  atorvastatin (LIPITOR) 10 MG tablet TAKE 1 TABLET EACH MORNING Patient taking differently: Take 10 mg by mouth in the morning. 02/02/22  Yes Tonia Ghent, MD  Cholecalciferol (VITAMIN D) 50 MCG (2000 UT) CAPS Take 1 capsule (2,000 Units total) by mouth daily. 02/02/22  Yes Tonia Ghent, MD  levothyroxine (SYNTHROID) 50 MCG tablet TAKE 50 MCG BY MOUTH DAILY ON EMPTY STOMACH. Patient taking differently: Take 50 mcg by mouth daily before breakfast. TAKE 50 MCG  BY MOUTH DAILY ON EMPTY STOMACH. 04/28/22  Yes Tonia Ghent, MD  diclofenac Sodium (VOLTAREN) 1 % GEL Apply 4 g topically 4 (four) times daily. Patient not taking: Reported on 05/12/2022 03/23/22   Deno Etienne, DO  mirtazapine (REMERON) 7.5 MG tablet TAKE 1 TABLET BY MOUTH AT BEDTIME. Patient not taking: Reported on 05/11/2022 04/28/22   Tonia Ghent, MD  sodium chloride 1 g tablet Take 1 tablet (1 g total) by mouth daily. Patient not taking: Reported on 05/12/2022 02/02/22   Tonia Ghent, MD     Critical care time: Ferol Luz Joeangel Jeanpaul ACNP Acute Care Nurse Practitioner [place] Please consult Amion 05/13/2022, 9:00 AM

## 2022-05-13 NOTE — Progress Notes (Addendum)
Initial Nutrition Assessment  DOCUMENTATION CODES:   Severe malnutrition in context of chronic illness  INTERVENTION:  - Add Ensure Enlive po BID, each supplement provides 350 kcal and 20 grams of protein.   - Add MVI q day.   NUTRITION DIAGNOSIS:   Severe Malnutrition related to chronic illness as evidenced by percent weight loss, severe fat depletion, severe muscle depletion.  GOAL:   Patient will meet greater than or equal to 90% of their needs  MONITOR:   PO intake, Supplement acceptance  REASON FOR ASSESSMENT:   Consult Assessment of nutrition requirement/status  ASSESSMENT:   88 y.o. female admits related to pulmonary embolism. PMH includes: pulmonary fibrosis, hypothyroidism, pneumothorax, HLD.  Meds reviewed. Labs reviewed: Na low.   Pt was sleeping at time of assessment. Lunch tray was on counter, untouched. Pt is currently oriented x2. No intakes documented in record. RD will add Ensure BID for now and continue to monitor PO intakes. Per record, pt has experienced an 8.6% wt loss in less than 2 months which is significant.   NUTRITION - FOCUSED PHYSICAL EXAM:  Flowsheet Row Most Recent Value  Orbital Region Severe depletion  Upper Arm Region Unable to assess  Thoracic and Lumbar Region Unable to assess  Buccal Region Severe depletion  Temple Region Severe depletion  Clavicle Bone Region Severe depletion  Clavicle and Acromion Bone Region Unable to assess  Scapular Bone Region Unable to assess  Dorsal Hand Unable to assess  Patellar Region Unable to assess  Anterior Thigh Region Unable to assess  Posterior Calf Region Unable to assess  Edema (RD Assessment) None  Hair Unable to assess  Eyes Unable to assess  Mouth Unable to assess  Skin Reviewed  Nails Unable to assess       Diet Order:   Diet Order             Diet Heart Room service appropriate? Yes; Fluid consistency: Thin  Diet effective now                   EDUCATION NEEDS:    Not appropriate for education at this time  Skin:  Skin Assessment: Reviewed RN Assessment  Last BM:  unknown  Height:   Ht Readings from Last 1 Encounters:  05/11/22 '5\' 7"'$  (1.702 m)    Weight:   Wt Readings from Last 1 Encounters:  05/11/22 38.1 kg    Ideal Body Weight:     BMI:  Body mass index is 13.16 kg/m.  Estimated Nutritional Needs:   Kcal:  1200-1350 kcals  Protein:  60-70 gm  Fluid:  >/= 1.2 L  Thalia Bloodgood, RD, LDN, CNSC.

## 2022-05-13 NOTE — Progress Notes (Signed)
BLE venous duplex has been completed. Preliminary results messaged to Dr. Darrick Meigs.   Results can be found under chart review under CV PROC. 05/13/2022 4:04 PM Seann Genther RVT, RDMS

## 2022-05-13 NOTE — Plan of Care (Signed)
  Problem: Education: Goal: Knowledge of General Education information will improve Description: Including pain rating scale, medication(s)/side effects and non-pharmacologic comfort measures Outcome: Progressing   Problem: Activity: Goal: Risk for activity intolerance will decrease Outcome: Progressing   Problem: Elimination: Goal: Will not experience complications related to urinary retention Outcome: Progressing   Problem: Pain Managment: Goal: General experience of comfort will improve Outcome: Progressing   

## 2022-05-13 NOTE — Progress Notes (Signed)
ANTICOAGULATION CONSULT NOTE  Pharmacy Consult for heparin Indication: pulmonary embolus  No Known Allergies  Patient Measurements: Height: '5\' 7"'$  (170.2 cm) Weight: 38.1 kg (84 lb) IBW/kg (Calculated) : 61.6 Heparin Dosing Weight: TBW  Vital Signs: Temp: 97.4 F (36.3 C) (12/07 0929) Temp Source: Oral (12/07 0929) BP: 115/63 (12/07 0929) Pulse Rate: 60 (12/07 0929)  Labs: Recent Labs    05/11/22 1005 05/11/22 2158 05/12/22 0824 05/12/22 1820 05/12/22 2050 05/13/22 0336 05/13/22 0826  HGB 12.8 11.9*  --   --  10.9* 10.1*  --   HCT 37.9 35.1*  --   --  31.0* 28.6*  --   PLT 303.0 266  --   --  197 161  --   HEPARINUNFRC  --   --  0.51 0.58  --   --  0.62  CREATININE 0.78 1.31*  --   --   --  0.61  --   CKTOTAL  --   --   --   --  87  --   --   TROPONINIHS  --   --   --   --  7 6  --      Estimated Creatinine Clearance: 29.2 mL/min (by C-G formula based on SCr of 0.61 mg/dL).  Assessment: 36 YOF presenting with SOB, PE with RHS and pneumothorax.  She is not on anticoagulation PTA. Heparin level of 0.62 is therapeutic on heparin 650 units/hr. Hgb 10.1. Plts wnl.   Goal of Therapy:  Heparin level 0.3-0.7 units/ml Monitor platelets by anticoagulation protocol: Yes   Plan:  Continue heparin gtt 650 units/hr Monitor daily heparin level and CBC Follow up CCM recommendations for lytics and transition to oral anticoagulation  Cristela Felt, PharmD, BCPS Clinical Pharmacist 05/13/2022 9:34 AM

## 2022-05-13 NOTE — Progress Notes (Signed)
  Echocardiogram 2D Echocardiogram has been performed.  Tara Harris 05/13/2022, 5:51 PM

## 2022-05-13 NOTE — Progress Notes (Signed)
[facility]  PROGRESS NOTE  Tara Harris XIP:[id number] DOB: [birth year] DOA: 05/11/2022 PCP: Tonia Ghent, MD   Brief HPI:   88 year old female with a history of interstitial lung disease, hyperlipidemia, hypothyroidism, recent apical pneumothorax in October 6734, chronic diastolic heart failure was admitted for newly diagnosed right lower lobe pulmonary embolism.  She was seen at pulmonary clinic with worsening dyspnea, found to have D-dimer elevation, outpatient CTA chest ordered which was positive for PE. Patient started on IV heparin.    Subjective   This morning patient has been somnolent, after she received Vicodin for pain.   Assessment/Plan:   Acute pulmonary embolism -Started on heparin gtt -Pulmonology consulted, no role for thrombolytics or advanced intervention based on location of clot -Bilateral lower extremity Dopplers obtained today shows DVT bilaterally involving distal femoral veins in both lower extremities -Will switch to DOAC before discharge -Currently not requiring oxygen -Follow echocardiogram results  Hypothyroidism -Continue Synthroid  Hyperlipidemia -Continue Lipitor  Idiopathic pulmonary fibrosis -Followed by pulmonology as outpatient  Pneumothorax -Conservative management  Chronic diastolic heart failure -Euvolemic  Compression fracture of T9 vertebra -Continue pain management    Medications     atorvastatin  10 mg Oral Daily   [START ON 05/14/2022] feeding supplement  237 mL Oral BID BM   levothyroxine  50 mcg Oral Q0600   multivitamin with minerals  1 tablet Oral Daily     Data Reviewed:   CBG:  No results for input(s): "GLUCAP" in the last 168 hours.  SpO2: 100 %    Vitals:   05/13/22 0014 05/13/22 0513 05/13/22 0929 05/13/22 1647  BP: 128/67 (!) 148/93 115/63 138/74  Pulse: 83 78 60 69  Resp: '18 17 16 16  '$ Temp: 98.3 F (36.8 C) 98.5 F (36.9 C) (!) 97.4 F (36.3 C) 97.7 F (36.5 C)  TempSrc: Oral  Oral Oral Oral  SpO2: 98% 100% 100% 100%  Weight:      Height:          Data Reviewed:  Basic Metabolic Panel: Recent Labs  Lab 05/11/22 1005 05/11/22 2158 05/12/22 2050 05/13/22 0336  NA 134* 133*  --  132*  K 4.2 4.2  --  3.5  CL 94* 92*  --  98  CO2 32 31  --  27  GLUCOSE 115* 120*  --  127*  BUN 17 24*  --  14  CREATININE 0.78 1.31*  --  0.61  CALCIUM 10.2 10.1  --  8.2*  MG  --   --  1.5* 2.0  PHOS  --   --  3.1 3.2    CBC: Recent Labs  Lab 05/11/22 1005 05/11/22 2158 05/12/22 2050 05/13/22 0336  WBC 8.9 8.8 7.9 7.1  NEUTROABS 6.6 6.9 5.7  --   HGB 12.8 11.9* 10.9* 10.1*  HCT 37.9 35.1* 31.0* 28.6*  MCV 95.7 95.4 94.8 94.4  PLT 303.0 266 197 161    LFT Recent Labs  Lab 05/11/22 1005 05/11/22 2158 05/13/22 0336  AST '23 21 23  '$ ALT '16 14 16  '$ ALKPHOS 66 60 42  BILITOT 1.4* 1.1 1.1  PROT 8.2 7.8 6.0*  ALBUMIN 4.3 4.2 2.8*     Antibiotics: Anti-infectives (From admission, onward)    None        DVT prophylaxis: Heparin  Code Status: DNR  Family Communication: No family at bedside   CONSULTS    Objective    Physical Examination:   General: Appears somnolent Cardiovascular: S1-S2, regular  Respiratory: Clear to auscultation bilaterally Abdomen: Soft, nontender, no organomegaly Extremities: No edema in the lower extremities Neurologic: Somnolent, opens eyes to painful stimuli  Status is: Inpatient:             [name]   [facility] If 7PM-7AM, please contact night-coverage at [email or web address], Office  [phone]   05/13/2022, 5:06 PM  LOS: 1 day

## 2022-05-13 NOTE — TOC CM/SW Note (Addendum)
Benefit check for Eliquis and Xarelto   Can provide 30 day free card.   If [facility] fills discharge prescription they have 30 day free card.   Pharmacy did benefit check. When he ran patient's [employer] it said to use primary. Pharmacy found [facility] but it has been terminated. Pharmacist called patient's [facility] and was told patient has been paying cash for her medications.   Patient or family will need to call [employer] and let them know she no longer has Medicare part D or [place].   NCM called patient's daughter Abel Presto [phone] and explained above. She will call [employer].    If [facility] fills discharge prescription they have 30 day free card.   NCM will leave 30 day free cards for Eliquis and Xarelto at bedside for daughter incase discharge over weekend. Daughter voiced understanding

## 2022-05-14 ENCOUNTER — Other Ambulatory Visit (HOSPITAL_COMMUNITY): Payer: Self-pay

## 2022-05-14 DIAGNOSIS — I2699 Other pulmonary embolism without acute cor pulmonale: Secondary | ICD-10-CM | POA: Diagnosis not present

## 2022-05-14 DIAGNOSIS — J939 Pneumothorax, unspecified: Secondary | ICD-10-CM | POA: Diagnosis not present

## 2022-05-14 DIAGNOSIS — S22070A Wedge compression fracture of T9-T10 vertebra, initial encounter for closed fracture: Secondary | ICD-10-CM | POA: Diagnosis not present

## 2022-05-14 DIAGNOSIS — I5032 Chronic diastolic (congestive) heart failure: Secondary | ICD-10-CM | POA: Diagnosis not present

## 2022-05-14 LAB — CBC
HCT: 34.2 % — ABNORMAL LOW (ref 36.0–46.0)
Hemoglobin: 11.3 g/dL — ABNORMAL LOW (ref 12.0–15.0)
MCH: 31.8 pg (ref 26.0–34.0)
MCHC: 33 g/dL (ref 30.0–36.0)
MCV: 96.3 fL (ref 80.0–100.0)
Platelets: 154 10*3/uL (ref 150–400)
RBC: 3.55 MIL/uL — ABNORMAL LOW (ref 3.87–5.11)
RDW: 13.1 % (ref 11.5–15.5)
WBC: 6.4 10*3/uL (ref 4.0–10.5)
nRBC: 0 % (ref 0.0–0.2)

## 2022-05-14 LAB — BASIC METABOLIC PANEL
Anion gap: 10 (ref 5–15)
BUN: 7 mg/dL — ABNORMAL LOW (ref 8–23)
CO2: 25 mmol/L (ref 22–32)
Calcium: 8.9 mg/dL (ref 8.9–10.3)
Chloride: 96 mmol/L — ABNORMAL LOW (ref 98–111)
Creatinine, Ser: 0.54 mg/dL (ref 0.44–1.00)
GFR, Estimated: >60 mL/min (ref >60)
Glucose, Bld: 95 mg/dL (ref 70–99)
Potassium: 3.8 mmol/L (ref 3.5–5.1)
Sodium: 131 mmol/L — ABNORMAL LOW (ref 135–145)

## 2022-05-14 LAB — HEPARIN LEVEL (UNFRACTIONATED): Heparin Unfractionated: 0.48 IU/mL (ref 0.30–0.70)

## 2022-05-14 NOTE — Progress Notes (Signed)
[facility]  PROGRESS NOTE  Shaden Higley PZW:[id number] DOB: Oct 08, 1931 DOA: 05/11/2022 PCP: Tonia Ghent, MD   Brief HPI:   88 year old female with a history of interstitial lung disease, hyperlipidemia, hypothyroidism, recent apical pneumothorax in October 4235, chronic diastolic heart failure was admitted for newly diagnosed right lower lobe pulmonary embolism.  She was seen at pulmonary clinic with worsening dyspnea, found to have D-dimer elevation, outpatient CTA chest ordered which was positive for PE. Patient started on IV heparin.    Subjective   Patient seen and examined, denies shortness of breath.   Assessment/Plan:   Acute pulmonary embolism -Started on heparin gtt -Pulmonology consulted, no role for thrombolytics or advanced intervention based on location of clot -Bilateral lower extremity Dopplers obtained today shows DVT bilaterally involving distal femoral veins in both lower extremities -Will switch to DOAC before discharge -Currently not requiring oxygen -Follow echocardiogram shows mildly elevated pulmonary artery systolic pressure; right ventricular function is normal  Hypothyroidism -Continue Synthroid  Hyperlipidemia -Continue Lipitor  Idiopathic pulmonary fibrosis -Followed by pulmonology as outpatient  Pneumothorax -Conservative management  Chronic diastolic heart failure -Euvolemic  Compression fracture of T9 vertebra -Continue pain management  Will get PT evaluation to see if patient can go home versus skilled nursing facility for rehab  Medications     atorvastatin  10 mg Oral Daily   feeding supplement  237 mL Oral BID BM   levothyroxine  50 mcg Oral Q0600   multivitamin with minerals  1 tablet Oral Daily     Data Reviewed:   CBG:  No results for input(s): "GLUCAP" in the last 168 hours.  SpO2: 100 % O2 Flow Rate (L/min): 2 L/min    Vitals:   05/13/22 2056 05/14/22 0455 05/14/22 0806 05/14/22 1602  BP:  103/67 116/88 120/68 (!) 106/59  Pulse: (!) 104 89 72 86  Resp:  '20 16 16  '$ Temp: 98 F (36.7 C) 97.8 F (36.6 C) (!) 97.5 F (36.4 C) 97.7 F (36.5 C)  TempSrc: Oral Oral Oral Oral  SpO2: 92% 100% 100% 100%  Weight:      Height:          Data Reviewed:  Basic Metabolic Panel: Recent Labs  Lab 05/11/22 1005 05/11/22 2158 05/12/22 2050 05/13/22 0336 05/14/22 0352  NA 134* 133*  --  132* 131*  K 4.2 4.2  --  3.5 3.8  CL 94* 92*  --  98 96*  CO2 32 31  --  27 25  GLUCOSE 115* 120*  --  127* 95  BUN 17 24*  --  14 7*  CREATININE 0.78 1.31*  --  0.61 0.54  CALCIUM 10.2 10.1  --  8.2* 8.9  MG  --   --  1.5* 2.0  --   PHOS  --   --  3.1 3.2  --     CBC: Recent Labs  Lab 05/11/22 1005 05/11/22 2158 05/12/22 2050 05/13/22 0336 05/14/22 0352  WBC 8.9 8.8 7.9 7.1 6.4  NEUTROABS 6.6 6.9 5.7  --   --   HGB 12.8 11.9* 10.9* 10.1* 11.3*  HCT 37.9 35.1* 31.0* 28.6* 34.2*  MCV 95.7 95.4 94.8 94.4 96.3  PLT 303.0 266 197 161 154    LFT Recent Labs  Lab 05/11/22 1005 05/11/22 2158 05/13/22 0336  AST '23 21 23  '$ ALT '16 14 16  '$ ALKPHOS 66 60 42  BILITOT 1.4* 1.1 1.1  PROT 8.2 7.8 6.0*  ALBUMIN 4.3 4.2 2.8*  Antibiotics: Anti-infectives (From admission, onward)    None        DVT prophylaxis: Heparin  Code Status: DNR  Family Communication: No family at bedside   CONSULTS    Objective    Physical Examination:  General-appears in no acute distress Heart-S1-S2, regular, no murmur auscultated Lungs-clear to auscultation bilaterally, no wheezing or crackles auscultated Abdomen-soft, nontender, no organomegaly Extremities-no edema in the lower extremities Neuro-alert, oriented x3, no focal deficit noted   Status is: Inpatient:             [name]   [facility] If 7PM-7AM, please contact night-coverage at [email or web address], Office  [phone]   05/14/2022, 5:21 PM  LOS: 2 days

## 2022-05-14 NOTE — Evaluation (Signed)
NAME:  Tara Harris, MRN:  [id number], DOB:  [birth year], LOS: 2 ADMISSION DATE:  05/11/2022, CONSULTATION DATE: 05/13/2022 REFERRING MD: [facility], CHIEF COMPLAINT: Right lower lobe pulmonary embolism  History of Present Illness:  88 year old female with a history of IPF who has declined treatment per Dr. Kimber Relic.  She also has a known left 10% pneumothorax from fall is being monitored and treated conservatively.  She was seen by Dr. Vaughan Browner for IPF CTA was ordered which demonstrated a new right lower lobe pulmonary embolisms along with continued pneumothorax on the left.  She is admitted to [facility] by the hospitalist service started on heparin drip CT scan did show some heart strain.  Pulmonary critical care asked to evaluate.  Of note she was given Norco x 2 and Xanax during the night and during examination was quite somnolent.  Pertinent  Medical History   Past Medical History:  Diagnosis Date   Arthritis    High cholesterol    Hypothyroidism    IBS (irritable bowel syndrome)    Menopause    Pulmonary fibrosis (HCC)    Thyroid disease    Tubular adenoma 01/13/2007     Significant Hospital Events: Including procedures, antibiotic start and stop dates in addition to other pertinent events   Continues to have a left pneumothorax is being monitored  Interim History / Subjective:  Much more awake now.  Given her Norco and benzodiazepines no acute distress  Pulmonary critical care will sign off  Objective   Blood pressure 120/68, pulse 72, temperature (!) 97.5 F (36.4 C), temperature source Oral, resp. rate 16, height '5\' 7"'$  (1.702 m), weight 38.1 kg, SpO2 100 %.        Intake/Output Summary (Last 24 hours) at 05/14/2022 0930 Last data filed at 05/13/2022 1836 Gross per 24 hour  Intake 360 ml  Output 100 ml  Net 260 ml   Filed Weights   05/11/22 2101  Weight: 38.1 kg    Examination: General: Left lower leg HEENT: MM pink/moist no JVD Neuro: Grossly intact  sitting up eating CV: Heart sounds are regular PULM: Diminished in the base  GI: soft, bsx4 active  GU: Extremities: warm/dry, voids 1+ edema  Skin: no rashes or lesions    Resolved Hospital Problem list     Assessment & Plan:  New right lower lobe pulmonary embolism with evidence of heart strain. Agree with heparin drip Positive for DVT left femoral vein Continue to hold Norco benzodiazepine    IPF with restrictive disease and poor diffusion followed by Dr. Vaughan Browner. Has declined treatment for IPF Currently DNR  History of 10 to 15% left pneumothorax still present on CT scan Continue to monitor  Hypothyroidism Per primary  Pulmonary critical care will sign off at this time. Best Practice (right click and "Reselect all SmartList Selections" daily)   Diet/type: Regular consistency (see orders) DVT prophylaxis: systemic heparin GI prophylaxis: PPI Lines: N/A Foley:  N/A Code Status:  DNR Last date of multidisciplinary goals of care discussion [tbd]  Labs   CBC: Recent Labs  Lab 05/11/22 1005 05/11/22 2158 05/12/22 2050 05/13/22 0336 05/14/22 0352  WBC 8.9 8.8 7.9 7.1 6.4  NEUTROABS 6.6 6.9 5.7  --   --   HGB 12.8 11.9* 10.9* 10.1* 11.3*  HCT 37.9 35.1* 31.0* 28.6* 34.2*  MCV 95.7 95.4 94.8 94.4 96.3  PLT 303.0 266 197 161 093    Basic Metabolic Panel: Recent Labs  Lab 05/11/22 1005 05/11/22 2158 05/12/22 2050  05/13/22 0336 05/14/22 0352  NA 134* 133*  --  132* 131*  K 4.2 4.2  --  3.5 3.8  CL 94* 92*  --  98 96*  CO2 32 31  --  27 25  GLUCOSE 115* 120*  --  127* 95  BUN 17 24*  --  14 7*  CREATININE 0.78 1.31*  --  0.61 0.54  CALCIUM 10.2 10.1  --  8.2* 8.9  MG  --   --  1.5* 2.0  --   PHOS  --   --  3.1 3.2  --    GFR: Estimated Creatinine Clearance: 29.2 mL/min (by C-G formula based on SCr of 0.54 mg/dL). Recent Labs  Lab 05/11/22 2158 05/12/22 2050 05/13/22 0336 05/14/22 0352  WBC 8.8 7.9 7.1 6.4    Liver Function Tests: Recent  Labs  Lab 05/11/22 1005 05/11/22 2158 05/13/22 0336  AST '23 21 23  '$ ALT '16 14 16  '$ ALKPHOS 66 60 42  BILITOT 1.4* 1.1 1.1  PROT 8.2 7.8 6.0*  ALBUMIN 4.3 4.2 2.8*   No results for input(s): "LIPASE", "AMYLASE" in the last 168 hours. No results for input(s): "AMMONIA" in the last 168 hours.  ABG No results found for: "PHART", "PCO2ART", "PO2ART", "HCO3", "TCO2", "ACIDBASEDEF", "O2SAT"   Coagulation Profile: No results for input(s): "INR", "PROTIME" in the last 168 hours.  Cardiac Enzymes: Recent Labs  Lab 05/12/22 2050  CKTOTAL 87    HbA1C: Hgb A1c MFr Bld  Date/Time Value Ref Range Status  11/20/2018 08:13 AM 5.9 (H) <5.7 % of total Hgb Final    Comment:    For someone without known diabetes, a hemoglobin  A1c value between 5.7% and 6.4% is consistent with prediabetes and should be confirmed with a  follow-up test. . For someone with known diabetes, a value <7% indicates that their diabetes is well controlled. A1c targets should be individualized based on duration of diabetes, age, comorbid conditions, and other considerations. . This assay result is consistent with an increased risk of diabetes. . Currently, no consensus exists regarding use of hemoglobin A1c for diagnosis of diabetes for children. Marland Kitchen   11/22/2017 08:11 AM 5.6 <5.7 % of total Hgb Final    Comment:    For the purpose of screening for the presence of diabetes: . <5.7%       Consistent with the absence of diabetes 5.7-6.4%    Consistent with increased risk for diabetes             (prediabetes) > or =6.5%  Consistent with diabetes . This assay result is consistent with a decreased risk of diabetes. . Currently, no consensus exists regarding use of hemoglobin A1c for diagnosis of diabetes in children. . According to American Diabetes Association (ADA) guidelines, hemoglobin A1c <7.0% represents optimal control in non-pregnant diabetic patients. Different metrics may apply to specific  patient populations.  Standards of Medical Care in Diabetes(ADA). .     CBG: No results for input(s): "GLUCAP" in the last 168 hours.   Critical care time: Ferol Luz Molly Savarino ACNP Acute Care Nurse Practitioner [place] Please consult Amion 05/14/2022, 9:30 AM

## 2022-05-14 NOTE — Progress Notes (Signed)
ANTICOAGULATION CONSULT NOTE  Pharmacy Consult for heparin Indication: pulmonary embolus  No Known Allergies  Patient Measurements: Height: '5\' 7"'$  (170.2 cm) Weight: 38.1 kg (84 lb) IBW/kg (Calculated) : 61.6 Heparin Dosing Weight: TBW  Vital Signs: Temp: 97.5 F (36.4 C) (12/08 0806) Temp Source: Oral (12/08 0806) BP: 120/68 (12/08 0806) Pulse Rate: 72 (12/08 0806)  Labs: Recent Labs    05/11/22 2158 05/12/22 0824 05/12/22 1820 05/12/22 2050 05/13/22 0336 05/13/22 0826 05/14/22 0352  HGB 11.9*  --   --  10.9* 10.1*  --  11.3*  HCT 35.1*  --   --  31.0* 28.6*  --  34.2*  PLT 266  --   --  197 161  --  154  HEPARINUNFRC  --    < > 0.58  --   --  0.62 0.48  CREATININE 1.31*  --   --   --  0.61  --  0.54  CKTOTAL  --   --   --  87  --   --   --   TROPONINIHS  --   --   --  7 6  --   --    < > = values in this interval not displayed.     Estimated Creatinine Clearance: 29.2 mL/min (by C-G formula based on SCr of 0.54 mg/dL).  Assessment: 54 YOF presenting with SOB, PE with RHS and pneumothorax.  She is not on anticoagulation PTA. Heparin level of 0.48 is therapeutic on heparin 650 units/hr. Hgb 11.3. Plts wnl.   Goal of Therapy:  Heparin level 0.3-0.7 units/ml Monitor platelets by anticoagulation protocol: Yes   Plan:  Continue heparin gtt 650 units/hr Monitor daily heparin level and CBC Follow up transition to oral anticoagulation  Alanda Slim, PharmD, [facility] Clinical Pharmacist Please see AMION for all Pharmacists' Contact Phone Numbers 05/14/2022, 8:59 AM

## 2022-05-14 NOTE — Evaluation (Signed)
Physical Therapy Evaluation Patient Details Name: Tara Harris MRN: [id number] DOB: 1932-04-21 Today's Date: 05/14/2022  History of Present Illness  88 yo female with onset of SOB and elevated d-dimer was admitted on 12/5 with new dx of PE, with 10-15% pneumothorax on L side and L femoral DVT dx yesterday.  **clearance from MD for mobility was given with DVT's and PE**.  PMHx:  ILD, HLD, hypothyroidism, apical pneumothorax, CHF,  Clinical Impression  Pt was seen today after clearance for DVT's from Yesterday's report and PE from 12/5.  Pt is desaturating on standing to 88% on 3L O2 but is recovering upon sitting.  Transition to [facility] with no drops, pulses are [facility] and pt is not symptomatic.  Follow up with her to progress to gait as tolerated, and may have to titrate O2 for this.  SNF requested due to her 12 hours caregiver help a week without family to intervene.  Pt has limited ability to give out a lot of history, and unclear if this is her baseline.  Follow acute PT goals as are outlined below.      Recommendations for follow up therapy are one component of a multi-disciplinary discharge planning process, led by the attending physician.  Recommendations may be updated based on patient status, additional functional criteria and insurance authorization.  Follow Up Recommendations Skilled nursing-short term rehab (<3 hours/day) Can patient physically be transported by private vehicle: No    Assistance Recommended at Discharge Frequent or constant Supervision/Assistance  Patient can return home with the following  A lot of help with walking and/or transfers;A lot of help with bathing/dressing/bathroom;Assistance with cooking/housework;Direct supervision/assist for medications management;Direct supervision/assist for financial management;Assist for transportation;Help with stairs or ramp for entrance    Equipment Recommendations None recommended by PT  Recommendations for Other Services        Functional Status Assessment Patient has had a recent decline in their functional status and demonstrates the ability to make significant improvements in function in a reasonable and predictable amount of time.     Precautions / Restrictions Precautions Precautions: Fall Precaution Comments: recent DVT and PE history Restrictions Weight Bearing Restrictions: No      Mobility  Bed Mobility Overal bed mobility: Needs Assistance Bed Mobility: Supine to Sit, Sit to Supine     Supine to sit: Mod assist Sit to supine: Mod assist   General bed mobility comments: help to support trunk out and legs back    Transfers Overall transfer level: Needs assistance Equipment used: Rolling walker (2 wheels) Transfers: Sit to/from Stand Sit to Stand: Min assist           General transfer comment: min assist to stand and to do HHA to [facility] as well    Ambulation/Gait               General Gait Details: delayed due to desaturation with standing  Stairs            Wheelchair Mobility    Modified Rankin (Stroke Patients Only)       Balance Overall balance assessment: Needs assistance Sitting-balance support: Feet supported Sitting balance-Leahy Scale: Fair     Standing balance support: Bilateral upper extremity supported, During functional activity Standing balance-Leahy Scale: Poor                               Pertinent Vitals/Pain Pain Assessment Pain Assessment: Faces Faces Pain Scale: No hurt  Home Living Family/patient expects to be discharged to:: Private residence Living Arrangements: Alone Available Help at Discharge: Family;Available PRN/intermittently Type of Home: House Home Access: Level entry       Home Layout: One level Home Equipment: Air cabin crew (4 wheels);Cane - single point      Prior Function Prior Level of Function : Needs assist       Physical Assist : Mobility (physical) Mobility (physical): Gait    Mobility Comments: using SPC or rollator       Hand Dominance   Dominant Hand: Right    Extremity/Trunk Assessment   Upper Extremity Assessment Upper Extremity Assessment: Generalized weakness    Lower Extremity Assessment Lower Extremity Assessment: Generalized weakness    Cervical / Trunk Assessment Cervical / Trunk Assessment: Kyphotic  Communication   Communication: No difficulties  Cognition Arousal/Alertness: Awake/alert Behavior During Therapy: Flat affect Overall Cognitive Status: No family/caregiver present to determine baseline cognitive functioning                                 General Comments: unclear if pt is baseline or not        General Comments General comments (skin integrity, edema, etc.): pt is getting up to side of bed with help, requires monitoring of sats and HR but upon standing wa s88% sat    Exercises     Assessment/Plan    PT Assessment Patient needs continued PT services  PT Problem List Decreased strength;Decreased activity tolerance;Decreased balance;Decreased mobility;Decreased coordination;Decreased cognition;Decreased knowledge of use of DME;Decreased safety awareness;Cardiopulmonary status limiting activity       PT Treatment Interventions DME instruction;Gait training;Functional mobility training;Therapeutic activities;Therapeutic exercise;Balance training;Neuromuscular re-education;Patient/family education    PT Goals (Current goals can be found in the Care Plan section)  Acute Rehab PT Goals Patient Stated Goal: to return home with her caregiver three days a week PT Goal Formulation: With patient Time For Goal Achievement: 05/28/22 Potential to Achieve Goals: Good    Frequency Min 3X/week     Co-evaluation               AM-PAC PT "6 Clicks" Mobility  Outcome Measure Help needed turning from your back to your side while in a flat bed without using bedrails?: A Lot Help needed moving from lying  on your back to sitting on the side of a flat bed without using bedrails?: A Lot Help needed moving to and from a bed to a chair (including a wheelchair)?: A Lot Help needed standing up from a chair using your arms (e.g., wheelchair or bedside chair)?: A Little Help needed to walk in hospital room?: Total Help needed climbing 3-5 steps with a railing? : Total 6 Click Score: 11    End of Session Equipment Utilized During Treatment: Gait belt;Oxygen Activity Tolerance: Patient limited by fatigue;Treatment limited secondary to medical complications (Comment) Patient left: in bed;with call bell/phone within reach;with bed alarm set Nurse Communication: Mobility status PT Visit Diagnosis: Unsteadiness on feet (R26.81);Muscle weakness (generalized) (M62.81);Difficulty in walking, not elsewhere classified (R26.2)    Time: 5681-2751 PT Time Calculation (min) (ACUTE ONLY): 41 min   Charges:   PT Evaluation $PT Eval Moderate Complexity: 1 Mod PT Treatments $Therapeutic Activity: 23-37 mins       Ramond Dial 05/14/2022, 7:56 PM  Mee Hives, PT PhD Acute Rehab Dept. Number: [place] [facility] [place]

## 2022-05-14 NOTE — Care Management Important Message (Signed)
Important Message  Patient Details  Name: Tara Harris MRN: [id number] Date of Birth: 03/09/1932   Medicare Important Message Given:  Yes     Hannah Beat 05/14/2022, 2:52 PM

## 2022-05-14 NOTE — Progress Notes (Signed)
PT Cancellation Note  Patient Details Name: Tara Harris MRN: [id number] DOB: 1932/02/26   Cancelled Treatment:    Reason Eval/Treat Not Completed: Other (comment).  Eating a late lunch and will reattempt at another time.   Ramond Dial 05/14/2022, 2:44 PM  Mee Hives, PT PhD Acute Rehab Dept. Number: [place] [facility] [place]

## 2022-05-15 DIAGNOSIS — J939 Pneumothorax, unspecified: Secondary | ICD-10-CM | POA: Diagnosis not present

## 2022-05-15 DIAGNOSIS — S22070A Wedge compression fracture of T9-T10 vertebra, initial encounter for closed fracture: Secondary | ICD-10-CM | POA: Diagnosis not present

## 2022-05-15 DIAGNOSIS — I2699 Other pulmonary embolism without acute cor pulmonale: Secondary | ICD-10-CM | POA: Diagnosis not present

## 2022-05-15 DIAGNOSIS — I5032 Chronic diastolic (congestive) heart failure: Secondary | ICD-10-CM | POA: Diagnosis not present

## 2022-05-15 LAB — CBC
HCT: 32.2 % — ABNORMAL LOW (ref 36.0–46.0)
Hemoglobin: 10.8 g/dL — ABNORMAL LOW (ref 12.0–15.0)
MCH: 32.4 pg (ref 26.0–34.0)
MCHC: 33.5 g/dL (ref 30.0–36.0)
MCV: 96.7 fL (ref 80.0–100.0)
Platelets: 207 10*3/uL (ref 150–400)
RBC: 3.33 MIL/uL — ABNORMAL LOW (ref 3.87–5.11)
RDW: 13.2 % (ref 11.5–15.5)
WBC: 8.4 10*3/uL (ref 4.0–10.5)
nRBC: 0 % (ref 0.0–0.2)

## 2022-05-15 LAB — HEPARIN LEVEL (UNFRACTIONATED): Heparin Unfractionated: 0.26 IU/mL — ABNORMAL LOW (ref 0.30–0.70)

## 2022-05-15 MED ORDER — APIXABAN 5 MG PO TABS
10.0000 mg | ORAL_TABLET | Freq: Two times a day (BID) | ORAL | Status: DC
  Administered 2022-05-15 – 2022-05-20 (×11): 10 mg via ORAL
  Filled 2022-05-15 (×11): qty 2

## 2022-05-15 MED ORDER — APIXABAN 5 MG PO TABS: 5.0000 mg | ORAL_TABLET | Freq: Two times a day (BID) | ORAL | Status: DC

## 2022-05-15 NOTE — Progress Notes (Signed)
ANTICOAGULATION CONSULT NOTE - Initial Consult  Pharmacy Consult for Apixaban Indication: pulmonary embolus  No Known Allergies  Patient Measurements: Height: '5\' 7"'$  (170.2 cm) Weight: 38.1 kg (84 lb) IBW/kg (Calculated) : 61.6  Vital Signs: Temp: 97.9 F (36.6 C) (12/09 0834) Temp Source: Oral (12/09 0336) BP: 99/62 (12/09 0834) Pulse Rate: 87 (12/09 0834)  Labs: Recent Labs    05/12/22 2050 05/13/22 0336 05/13/22 0826 05/14/22 0352 05/15/22 0209  HGB 10.9* 10.1*  --  11.3*  --   HCT 31.0* 28.6*  --  34.2*  --   PLT 197 161  --  154  --   HEPARINUNFRC  --   --  0.62 0.48 0.26*  CREATININE  --  0.61  --  0.54  --   CKTOTAL 87  --   --   --   --   TROPONINIHS 7 6  --   --   --     Estimated Creatinine Clearance: 29.2 mL/min (by C-G formula based on SCr of 0.54 mg/dL).   Medical History: Past Medical History:  Diagnosis Date   Arthritis    High cholesterol    Hypothyroidism    IBS (irritable bowel syndrome)    Menopause    Pulmonary fibrosis (HCC)    Thyroid disease    Tubular adenoma 01/13/2007    Medications:  Scheduled:   apixaban  10 mg Oral BID   Followed by   Derrill Memo ON 05/22/2022] apixaban  5 mg Oral BID   atorvastatin  10 mg Oral Daily   feeding supplement  237 mL Oral BID BM   levothyroxine  50 mcg Oral Q0600   multivitamin with minerals  1 tablet Oral Daily    Assessment: 88 YO F presenting with SOB, PE with right heart strain, and pneumothorax. She is not on anticoagulation PTA. Patient was initially started on heparin gtt 12/5 - 12/9. Due to patient discharging, will switch to an oral anticoagulant. Will start apixaban today (12/9) and dose according to VTE treatment. No dose adjustment required. CBC ordered for 12/9 but no results back.  Goal of Therapy:  Monitor platelets by anticoagulation protocol: Yes   Plan:  Apixaban 10 mg PO BID x 7 days (12/9-12/15) followed by 5 mg PO BID Monitor daily CBC, s/sx of bleeding  [place] Intern

## 2022-05-15 NOTE — Progress Notes (Signed)
[facility]  PROGRESS NOTE  Matsue Strom NWG:[id number] DOB: Oct 06, 1931 DOA: 05/11/2022 PCP: Tonia Ghent, MD   Brief HPI:   88 year old female with a history of interstitial lung disease, hyperlipidemia, hypothyroidism, recent apical pneumothorax in October 5784, chronic diastolic heart failure was admitted for newly diagnosed right lower lobe pulmonary embolism.  She was seen at pulmonary clinic with worsening dyspnea, found to have D-dimer elevation, outpatient CTA chest ordered which was positive for PE. Patient started on IV heparin.    Subjective   Patient seen and examined, denies any complaints.  No chest pain or shortness of breath.   Assessment/Plan:   Acute pulmonary embolism -Started on heparin gtt -Pulmonology consulted, no role for thrombolytics or advanced intervention based on location of clot -Bilateral lower extremity Dopplers obtained today shows DVT bilaterally involving distal femoral veins in both lower extremities -Will switch to DOAC today -Currently not requiring oxygen - echocardiogram shows mildly elevated pulmonary artery systolic pressure; right ventricular function is normal  Hypothyroidism -Continue Synthroid  Hyperlipidemia -Continue Lipitor  Idiopathic pulmonary fibrosis -Followed by pulmonology as outpatient  Pneumothorax -Conservative management  Chronic diastolic heart failure -Euvolemic  Compression fracture of T9 vertebra -Continue pain management  Patient to go to skilled nursing facility for rehab  Medications     atorvastatin  10 mg Oral Daily   feeding supplement  237 mL Oral BID BM   levothyroxine  50 mcg Oral Q0600   multivitamin with minerals  1 tablet Oral Daily     Data Reviewed:   CBG:  No results for input(s): "GLUCAP" in the last 168 hours.  SpO2: 100 % O2 Flow Rate (L/min): 2 L/min    Vitals:   05/14/22 1602 05/14/22 2013 05/15/22 0336 05/15/22 0834  BP: (!) 106/59 94/62 135/76 99/62   Pulse: 86 100 89 87  Resp: '16 17  18  '$ Temp: 97.7 F (36.5 C) 97.8 F (36.6 C) 98.2 F (36.8 C) 97.9 F (36.6 C)  TempSrc: Oral Oral Oral   SpO2: 100% 99% 100% 100%  Weight:      Height:          Data Reviewed:  Basic Metabolic Panel: Recent Labs  Lab 05/11/22 1005 05/11/22 2158 05/12/22 2050 05/13/22 0336 05/14/22 0352  NA 134* 133*  --  132* 131*  K 4.2 4.2  --  3.5 3.8  CL 94* 92*  --  98 96*  CO2 32 31  --  27 25  GLUCOSE 115* 120*  --  127* 95  BUN 17 24*  --  14 7*  CREATININE 0.78 1.31*  --  0.61 0.54  CALCIUM 10.2 10.1  --  8.2* 8.9  MG  --   --  1.5* 2.0  --   PHOS  --   --  3.1 3.2  --     CBC: Recent Labs  Lab 05/11/22 1005 05/11/22 2158 05/12/22 2050 05/13/22 0336 05/14/22 0352  WBC 8.9 8.8 7.9 7.1 6.4  NEUTROABS 6.6 6.9 5.7  --   --   HGB 12.8 11.9* 10.9* 10.1* 11.3*  HCT 37.9 35.1* 31.0* 28.6* 34.2*  MCV 95.7 95.4 94.8 94.4 96.3  PLT 303.0 266 197 161 154    LFT Recent Labs  Lab 05/11/22 1005 05/11/22 2158 05/13/22 0336  AST '23 21 23  '$ ALT '16 14 16  '$ ALKPHOS 66 60 42  BILITOT 1.4* 1.1 1.1  PROT 8.2 7.8 6.0*  ALBUMIN 4.3 4.2 2.8*     Antibiotics:  Anti-infectives (From admission, onward)    None        DVT prophylaxis: Heparin  Code Status: DNR  Family Communication: No family at bedside   CONSULTS    Objective    Physical Examination:  General-appears in no acute distress Heart-S1-S2, regular, no murmur auscultated Lungs-clear to auscultation bilaterally, no wheezing or crackles auscultated Abdomen-soft, nontender, no organomegaly Extremities-no edema in the lower extremities Neuro-alert, oriented x3, no focal deficit noted   Status is: Inpatient:             [name]   [facility] If 7PM-7AM, please contact night-coverage at [email or web address], Office  [phone]   05/15/2022, 10:05 AM  LOS: 3 days

## 2022-05-15 NOTE — Progress Notes (Signed)
ANTICOAGULATION CONSULT NOTE  Pharmacy Consult for heparin Indication: pulmonary embolus  No Known Allergies  Patient Measurements: Height: '5\' 7"'$  (170.2 cm) Weight: 38.1 kg (84 lb) IBW/kg (Calculated) : 61.6 Heparin Dosing Weight: TBW  Vital Signs: Temp: 98.2 F (36.8 C) (12/09 0336) Temp Source: Oral (12/09 0336) BP: 135/76 (12/09 0336) Pulse Rate: 89 (12/09 0336)  Labs: Recent Labs    05/12/22 2050 05/13/22 0336 05/13/22 0826 05/14/22 0352 05/15/22 0209  HGB 10.9* 10.1*  --  11.3*  --   HCT 31.0* 28.6*  --  34.2*  --   PLT 197 161  --  154  --   HEPARINUNFRC  --   --  0.62 0.48 0.26*  CREATININE  --  0.61  --  0.54  --   CKTOTAL 87  --   --   --   --   TROPONINIHS 7 6  --   --   --      Estimated Creatinine Clearance: 29.2 mL/min (by C-G formula based on SCr of 0.54 mg/dL).  Assessment: 45 YOF presenting with SOB, PE with RHS and pneumothorax.  She is not on anticoagulation PTA.   Heparin level below goal this am at 0.26 on 650 units/hr. No cbc done, will order. No bleeding or IV issues noted.   Goal of Therapy:  Heparin level 0.3-0.7 units/ml Monitor platelets by anticoagulation protocol: Yes   Plan:  Increase heparin gtt to 700 units/hr Recheck level in 8 hours Monitor daily heparin level and CBC Follow up transition to oral anticoagulation  Erin Hearing PharmD., BCPS Clinical Pharmacist 05/15/2022 4:27 AM

## 2022-05-15 NOTE — Discharge Instructions (Signed)
Information on my medicine - ELIQUIS (apixaban)  Why was Eliquis prescribed for you? Eliquis was prescribed to treat blood clots that may have been found in the veins of your legs (deep vein thrombosis) or in your lungs (pulmonary embolism) and to reduce the risk of them occurring again.  What do You need to know about Eliquis ? The starting dose is 10 mg (two 5 mg tablets) taken TWICE daily for the FIRST SEVEN (7) DAYS, then on (enter date)  05/22/22  the dose is reduced to ONE 5 mg tablet taken TWICE daily.  Eliquis may be taken with or without food.   Try to take the dose about the same time in the morning and in the evening. If you have difficulty swallowing the tablet whole please discuss with your pharmacist how to take the medication safely.  Take Eliquis exactly as prescribed and DO NOT stop taking Eliquis without talking to the doctor who prescribed the medication.  Stopping may increase your risk of developing a new blood clot.  Refill your prescription before you run out.  After discharge, you should have regular check-up appointments with your healthcare provider that is prescribing your Eliquis.    What do you do if you miss a dose? If a dose of ELIQUIS is not taken at the scheduled time, take it as soon as possible on the same day and twice-daily administration should be resumed. The dose should not be doubled to make up for a missed dose.  Important Safety Information A possible side effect of Eliquis is bleeding. You should call your healthcare provider right away if you experience any of the following: Bleeding from an injury or your nose that does not stop. Unusual colored urine (red or dark brown) or unusual colored stools (red or black). Unusual bruising for unknown reasons. A serious fall or if you hit your head (even if there is no bleeding).  Some medicines may interact with Eliquis and might increase your risk of bleeding or clotting while on Eliquis. To  help avoid this, consult your healthcare provider or pharmacist prior to using any new prescription or non-prescription medications, including herbals, vitamins, non-steroidal anti-inflammatory drugs (NSAIDs) and supplements.  This website has more information on Eliquis (apixaban): http://www.eliquis.com/eliquis/home

## 2022-05-15 NOTE — Progress Notes (Signed)
Spoke to patient's daughter at bedside. States that patient is on the list at an assisted living facility in [place] and there is a bed available. They would be interested in talking to social work about getting patient to that facility upon discharge.

## 2022-05-16 DIAGNOSIS — I5032 Chronic diastolic (congestive) heart failure: Secondary | ICD-10-CM | POA: Diagnosis not present

## 2022-05-16 DIAGNOSIS — J939 Pneumothorax, unspecified: Secondary | ICD-10-CM | POA: Diagnosis not present

## 2022-05-16 DIAGNOSIS — S22070A Wedge compression fracture of T9-T10 vertebra, initial encounter for closed fracture: Secondary | ICD-10-CM | POA: Diagnosis not present

## 2022-05-16 DIAGNOSIS — I2699 Other pulmonary embolism without acute cor pulmonale: Secondary | ICD-10-CM | POA: Diagnosis not present

## 2022-05-16 LAB — CBC
HCT: 32.6 % — ABNORMAL LOW (ref 36.0–46.0)
Hemoglobin: 11 g/dL — ABNORMAL LOW (ref 12.0–15.0)
MCH: 32.4 pg (ref 26.0–34.0)
MCHC: 33.7 g/dL (ref 30.0–36.0)
MCV: 96.2 fL (ref 80.0–100.0)
Platelets: 216 10*3/uL (ref 150–400)
RBC: 3.39 MIL/uL — ABNORMAL LOW (ref 3.87–5.11)
RDW: 13.2 % (ref 11.5–15.5)
WBC: 7.9 10*3/uL (ref 4.0–10.5)
nRBC: 0 % (ref 0.0–0.2)

## 2022-05-16 MED ORDER — GERHARDT'S BUTT CREAM
TOPICAL_CREAM | Freq: Two times a day (BID) | CUTANEOUS | Status: DC
  Filled 2022-05-16: qty 1

## 2022-05-16 NOTE — Progress Notes (Signed)
[facility]  PROGRESS NOTE  Tara Harris LKG:[id number] DOB: 06/13/1931 DOA: 05/11/2022 PCP: Tara Ghent, MD   Brief HPI:   88 year old female with a history of interstitial lung disease, hyperlipidemia, hypothyroidism, recent apical pneumothorax in October 6644, chronic diastolic heart failure was admitted for newly diagnosed right lower lobe pulmonary embolism.  She was seen at pulmonary clinic with worsening dyspnea, found to have D-dimer elevation, outpatient CTA chest ordered which was positive for PE. Patient started on IV heparin.    Subjective   Patient seen and examined, denies chest pain or shortness of breath.   Assessment/Plan:   Acute pulmonary embolism -Started on heparin gtt -Pulmonology consulted, no role for thrombolytics or advanced intervention based on location of clot -Bilateral lower extremity Dopplers obtained today shows DVT bilaterally involving distal femoral veins in both lower extremities -IV heparin was switched to Eliquis -Currently not requiring oxygen - echocardiogram shows mildly elevated pulmonary artery systolic pressure; right ventricular function is normal  Hypothyroidism -Continue Synthroid  Hyperlipidemia -Continue Lipitor  Idiopathic pulmonary fibrosis -Followed by pulmonology as outpatient  Pneumothorax -Conservative management  Chronic diastolic heart failure -Euvolemic  Compression fracture of T9 vertebra -Continue pain management  Patient to go to skilled nursing facility for rehab  Medications     apixaban  10 mg Oral BID   Followed by   Derrill Memo ON 05/22/2022] apixaban  5 mg Oral BID   atorvastatin  10 mg Oral Daily   feeding supplement  237 mL Oral BID BM   levothyroxine  50 mcg Oral Q0600   multivitamin with minerals  1 tablet Oral Daily     Data Reviewed:   CBG:  No results for input(s): "GLUCAP" in the last 168 hours.  SpO2: 100 % O2 Flow Rate (L/min): 2 L/min    Vitals:   05/15/22  1655 05/15/22 2102 05/15/22 2206 05/16/22 0354  BP: 103/60 124/73  101/63  Pulse: 100 96  85  Resp: 18     Temp: 98.3 F (36.8 C) 98.4 F (36.9 C)  97.7 F (36.5 C)  TempSrc: Oral Oral  Oral  SpO2: 100% 100% 98% 100%  Weight:      Height:          Data Reviewed:  Basic Metabolic Panel: Recent Labs  Lab 05/11/22 1005 05/11/22 2158 05/12/22 2050 05/13/22 0336 05/14/22 0352  NA 134* 133*  --  132* 131*  K 4.2 4.2  --  3.5 3.8  CL 94* 92*  --  98 96*  CO2 32 31  --  27 25  GLUCOSE 115* 120*  --  127* 95  BUN 17 24*  --  14 7*  CREATININE 0.78 1.31*  --  0.61 0.54  CALCIUM 10.2 10.1  --  8.2* 8.9  MG  --   --  1.5* 2.0  --   PHOS  --   --  3.1 3.2  --     CBC: Recent Labs  Lab 05/11/22 1005 05/11/22 2158 05/12/22 2050 05/13/22 0336 05/14/22 0352 05/15/22 1335 05/16/22 0441  WBC 8.9 8.8 7.9 7.1 6.4 8.4 7.9  NEUTROABS 6.6 6.9 5.7  --   --   --   --   HGB 12.8 11.9* 10.9* 10.1* 11.3* 10.8* 11.0*  HCT 37.9 35.1* 31.0* 28.6* 34.2* 32.2* 32.6*  MCV 95.7 95.4 94.8 94.4 96.3 96.7 96.2  PLT 303.0 266 197 161 154 207 216    LFT Recent Labs  Lab 05/11/22 1005 05/11/22 2158 05/13/22  0336  AST '23 21 23  '$ ALT '16 14 16  '$ ALKPHOS 66 60 42  BILITOT 1.4* 1.1 1.1  PROT 8.2 7.8 6.0*  ALBUMIN 4.3 4.2 2.8*     Antibiotics: Anti-infectives (From admission, onward)    None        DVT prophylaxis: Eliquis  Code Status: DNR  Family Communication: No family at bedside   CONSULTS    Objective    Physical Examination:  General-appears in no acute distress Heart-S1-S2, regular, no murmur auscultated Lungs-clear to auscultation bilaterally, no wheezing auscultated Abdomen-soft, nontender, no organomegaly Extremities-no edema in the lower extremities Neuro-alert, oriented x3, no focal deficit noted   Status is: Inpatient:             Tara Harris   [facility] If 7PM-7AM, please contact night-coverage at [email or web address], Office   [phone]   05/16/2022, 11:09 AM  LOS: 4 days

## 2022-05-16 NOTE — TOC Initial Note (Signed)
Transition of Care [facility]) - Initial/Assessment Note    Patient Details  Name: Tara Harris MRN: [id number] Date of Birth: 1931-07-07  Transition of Care [facility]) CM/SW Contact:    Milas Gain, [place] Phone Number: 05/16/2022, 12:22 PM  Clinical Narrative:                      CSW received consult for possible SNF placement at time of discharge. Due to patients current orientation CSW called patients daughter Tara Harris and LVM. CSW awaiting callback to discuss PT recommendation of SNF placement at time of discharge. CSW to continue to follow and assist with discharge planning needs.    Patient Goals and CMS Choice        Expected Discharge Plan and Services                                                Prior Living Arrangements/Services                       Activities of Daily Living Home Assistive Devices/Equipment: Environmental consultant (specify type), Eyeglasses ADL Screening (condition at time of admission) Patient's cognitive ability adequate to safely complete daily activities?: Yes Is the patient deaf or have difficulty hearing?: No Does the patient have difficulty seeing, even when wearing glasses/contacts?: No Does the patient have difficulty concentrating, remembering, or making decisions?: Yes Patient able to express need for assistance with ADLs?: Yes Does the patient have difficulty dressing or bathing?: Yes Independently performs ADLs?: No Communication: Independent Dressing (OT): Needs assistance Is this a change from baseline?: Change from baseline, expected to last <3days Grooming: Independent Feeding: Independent Bathing: Needs assistance Is this a change from baseline?: Change from baseline, expected to last <3 days Toileting: Needs assistance Is this a change from baseline?: Change from baseline, expected to last <3 days In/Out Bed: Needs assistance (with walker) Is this a change from baseline?: Change from baseline, expected to last >3  days Walks in Home: Needs assistance (with walker) Is this a change from baseline?: Pre-admission baseline Does the patient have difficulty walking or climbing stairs?: Yes Weakness of Legs: Both Weakness of Arms/Hands: Both  Permission Sought/Granted                  Emotional Assessment              Admission diagnosis:  Acute pulmonary embolism (HCC) [I26.99] AKI (acute kidney injury) ([place]) [N17.9] Acute pulmonary embolism without acute cor pulmonale, unspecified pulmonary embolism type ([place]) [I26.99] Patient Active Problem List   Diagnosis Date Noted   Protein-calorie malnutrition, severe 05/13/2022   Acute pulmonary embolism ([place]) 05/12/2022   AKI (acute kidney injury) ([place]) 05/12/2022   Pneumothorax 05/12/2022   Chronic diastolic CHF (congestive heart failure) ([place]) 05/12/2022   Compression fracture of T9 vertebra ([place]) 05/12/2022   Constipation 02/03/2022   IPF (idiopathic pulmonary fibrosis) ([place]) 01/01/2022   Light headedness 07/06/2021   Abnormal coordination 07/06/2021   Ataxia 07/06/2021   Fall at home 12/03/2020   Paresthesia 12/03/2020   Memory change 12/03/2020   Weight loss 12/03/2020   Syncope and collapse 02/17/2020   Orthostatic hypotension 02/17/2020   Low back pain 01/08/2020   Abnormal chest x-ray 12/14/2019   Neuropathy 08/03/2019   Caregiver role strain 02/26/2019   Advance care planning 02/26/2019  Abnormal gait 05/22/2018   Age-related osteoporosis without current pathological fracture 05/22/2018   Hyperglycemia 07/03/2015   Nocturnal leg cramps 07/03/2015   Hypothyroidism 02/20/2015   Urinary incontinence 05/31/2011   UNSPECIFIED VITAMIN D DEFICIENCY 01/07/2010   ARTHRITIS, LEFT KNEE 01/07/2010   IRRITABLE BOWEL SYNDROME 08/07/2009   Hyperlipidemia 12/26/2007   Anemia 12/26/2007   ALLERGIC RHINITIS, CHRONIC 12/26/2007   ARTHRITIS 12/26/2007   PCP:  Tonia Ghent, MD Pharmacy:   [id number] [place] [place] [address]Phone: [phone]: [phone] [facility] [address]: [phone]: [phone]    Social Determinants of Health (SDOH) Interventions    Readmission Risk Interventions     No data to display

## 2022-05-16 NOTE — Plan of Care (Signed)
  Problem: Coping: Goal: Level of anxiety will decrease Outcome: Not Progressing   Problem: Nutrition: Goal: Adequate nutrition will be maintained Outcome: Not Progressing   Problem: Elimination: Goal: Will not experience complications related to bowel motility Outcome: Not Progressing

## 2022-05-16 NOTE — Evaluation (Signed)
Occupational Therapy Evaluation Patient Details Name: Tara Harris MRN: [id number] DOB: 10-09-1931 Today's Date: 05/16/2022   History of Present Illness 88 yo female with onset of SOB and elevated d-dimer was admitted on 12/5 with new dx of PE, with 10-15% pneumothorax on L side and L femoral DVT dx yesterday.  **clearance from MD for mobility was given with DVT's and PE**.  PMHx:  ILD, HLD, hypothyroidism, apical pneumothorax, CHF,   Clinical Impression   Pt lived alone with intermittent assistance of her daughters and a caregiver. She reports walking with a rollator or a cane and was not able to offer information about PLOF in ADLs with no family available. Pt presents with impaired cognition, generalized weakness, decreased activity tolerance and impaired standing balance. She needs set up to total assist for ADLs, mod assist for bed mobility and min assist with RW OOB to chair. Pt with Sp02 of 92% on 2L 02 throughout session. Recommending SNF for further rehab. Per chart, family had planned for pt to eventually go to an ALF.      Recommendations for follow up therapy are one component of a multi-disciplinary discharge planning process, led by the attending physician.  Recommendations may be updated based on patient status, additional functional criteria and insurance authorization.   Follow Up Recommendations  Skilled nursing-short term rehab (<3 hours/day)     Assistance Recommended at Discharge Frequent or constant Supervision/Assistance  Patient can return home with the following A little help with walking and/or transfers;A lot of help with bathing/dressing/bathroom;Assistance with cooking/housework;Direct supervision/assist for medications management;Direct supervision/assist for financial management;Assist for transportation;Help with stairs or ramp for entrance    Functional Status Assessment  Patient has had a recent decline in their functional status and/or demonstrates  limited ability to make significant improvements in function in a reasonable and predictable amount of time  Equipment Recommendations  Other (comment) (defer to next venue)    Recommendations for Other Services       Precautions / Restrictions Precautions Precautions: Fall Restrictions Weight Bearing Restrictions: No      Mobility Bed Mobility Overal bed mobility: Needs Assistance Bed Mobility: Rolling, Sidelying to Sit Rolling: Mod assist Sidelying to sit: Mod assist       General bed mobility comments: cues to sequence and self assist, assist for LEs over EOB and to raise trunk    Transfers Overall transfer level: Needs assistance Equipment used: Rolling walker (2 wheels) Transfers: Sit to/from Stand Sit to Stand: Min assist           General transfer comment: posterior bias, multimodal cues for hand placement      Balance Overall balance assessment: Needs assistance   Sitting balance-Leahy Scale: Fair     Standing balance support: Bilateral upper extremity supported, During functional activity Standing balance-Leahy Scale: Poor                             ADL either performed or assessed with clinical judgement   ADL Overall ADL's : Needs assistance/impaired Eating/Feeding: Set up;Sitting   Grooming: Wash/dry hands;Wash/dry face;Brushing hair;Sitting;Set up   Upper Body Bathing: Moderate assistance;Sitting   Lower Body Bathing: Sit to/from stand;Total assistance   Upper Body Dressing : Minimal assistance;Sitting   Lower Body Dressing: Total assistance;Sit to/from stand   Toilet Transfer: Minimal assistance;Stand-pivot;Rolling walker (2 wheels)   Toileting- Clothing Manipulation and Hygiene: Total assistance;Sit to/from stand  Vision Baseline Vision/History: 1 Wears glasses Ability to See in Adequate Light: 0 Adequate Patient Visual Report: No change from baseline       Perception     Praxis       Pertinent Vitals/Pain Pain Assessment Pain Assessment: Faces Faces Pain Scale: No hurt     Hand Dominance Right   Extremity/Trunk Assessment Upper Extremity Assessment Upper Extremity Assessment: Generalized weakness   Lower Extremity Assessment Lower Extremity Assessment: Defer to PT evaluation   Cervical / Trunk Assessment Cervical / Trunk Assessment: Kyphotic   Communication Communication Communication: No difficulties   Cognition Arousal/Alertness: Awake/alert Behavior During Therapy: Flat affect Overall Cognitive Status: No family/caregiver present to determine baseline cognitive functioning                                 General Comments: slow processing, unable to offer PLOF     General Comments       Exercises     Shoulder Instructions      Home Living Family/patient expects to be discharged to:: Private residence Living Arrangements: Alone Available Help at Discharge: Family;Available PRN/intermittently Type of Home: House Home Access: Level entry     Home Layout: One level         Bathroom Toilet: Standard     Home Equipment: Air cabin crew (4 wheels);Cane - single point          Prior Functioning/Environment Prior Level of Function : Needs assist             Mobility Comments: using SPC or rollator          OT Problem List: Decreased strength;Impaired balance (sitting and/or standing);Decreased activity tolerance;Decreased knowledge of use of DME or AE;Decreased cognition      OT Treatment/Interventions: Self-care/ADL training;Patient/family education;Balance training;Therapeutic activities;DME and/or AE instruction    OT Goals(Current goals can be found in the care plan section) Acute Rehab OT Goals OT Goal Formulation: Patient unable to participate in goal setting Time For Goal Achievement: 05/30/22 Potential to Achieve Goals: Good ADL Goals Pt Will Perform Grooming: with min assist;standing Pt Will  Perform Upper Body Bathing: with min assist;sitting Pt Will Perform Upper Body Dressing: with supervision;sitting Pt Will Transfer to Toilet: with min assist;ambulating;bedside commode Additional ADL Goal #1: Pt will perform bed mobility with min assist in preparation for ADLs.  OT Frequency: Min 2X/week    Co-evaluation              AM-PAC OT "6 Clicks" Daily Activity     Outcome Measure Help from another person eating meals?: A Little Help from another person taking care of personal grooming?: A Little Help from another person toileting, which includes using toliet, bedpan, or urinal?: Total Help from another person bathing (including washing, rinsing, drying)?: A Lot Help from another person to put on and taking off regular upper body clothing?: A Little Help from another person to put on and taking off regular lower body clothing?: Total 6 Click Score: 13   End of Session Equipment Utilized During Treatment: Gait belt;Rolling walker (2 wheels)  Activity Tolerance: Patient tolerated treatment well Patient left: in chair;with call bell/phone within reach;with chair alarm set  OT Visit Diagnosis: Unsteadiness on feet (R26.81);Other abnormalities of gait and mobility (R26.89);Muscle weakness (generalized) (M62.81);Other symptoms and signs involving cognitive function                Time: 1478-2956 OT Time Calculation (  min): 30 min Charges:  OT General Charges $OT Visit: 1 Visit OT Evaluation $OT Eval Moderate Complexity: 1 Mod OT Treatments $Self Care/Home Management : 8-22 mins  Cleta Alberts, OTR/L Acute Rehabilitation Services Office: [phone]    Malka So 05/16/2022, 11:26 AM

## 2022-05-17 DIAGNOSIS — J939 Pneumothorax, unspecified: Secondary | ICD-10-CM | POA: Diagnosis not present

## 2022-05-17 DIAGNOSIS — I5032 Chronic diastolic (congestive) heart failure: Secondary | ICD-10-CM | POA: Diagnosis not present

## 2022-05-17 DIAGNOSIS — I2699 Other pulmonary embolism without acute cor pulmonale: Secondary | ICD-10-CM | POA: Diagnosis not present

## 2022-05-17 DIAGNOSIS — S22070A Wedge compression fracture of T9-T10 vertebra, initial encounter for closed fracture: Secondary | ICD-10-CM | POA: Diagnosis not present

## 2022-05-17 LAB — CBC
HCT: 29.8 % — ABNORMAL LOW (ref 36.0–46.0)
Hemoglobin: 10.4 g/dL — ABNORMAL LOW (ref 12.0–15.0)
MCH: 32.8 pg (ref 26.0–34.0)
MCHC: 34.9 g/dL (ref 30.0–36.0)
MCV: 94 fL (ref 80.0–100.0)
Platelets: 214 10*3/uL (ref 150–400)
RBC: 3.17 MIL/uL — ABNORMAL LOW (ref 3.87–5.11)
RDW: 13.4 % (ref 11.5–15.5)
WBC: 7 10*3/uL (ref 4.0–10.5)
nRBC: 0 % (ref 0.0–0.2)

## 2022-05-17 NOTE — Plan of Care (Signed)
  Problem: Education: Goal: Knowledge of General Education information will improve Description: Including pain rating scale, medication(s)/side effects and non-pharmacologic comfort measures Outcome: Progressing   Problem: Activity: Goal: Risk for activity intolerance will decrease Outcome: Progressing   Problem: Coping: Goal: Level of anxiety will decrease Outcome: Progressing   Problem: Elimination: Goal: Will not experience complications related to bowel motility Outcome: Progressing   Problem: Pain Managment: Goal: General experience of comfort will improve Outcome: Progressing   Problem: Safety: Goal: Ability to remain free from injury will improve Outcome: Progressing   

## 2022-05-17 NOTE — TOC Progression Note (Addendum)
Transition of Care [facility]) - Progression Note    Patient Details  Name: Khamari Yousuf MRN: [id number] Date of Birth: 1932/01/12  [facility] CM/SW [place], [place] Phone Number: 05/17/2022, 12:30 PM  Clinical Narrative:    CSW spoke with pt's dtr Debra who confirmed interest in SNF placement. Dtr has no preference, but would like for pt to remain in [place]. Pt usually lives at home alone with intermittent help. Pt's family is currently working on getting pt a placement at an ALF when she is ready to DC from SNF. Pt will need ambulance transport at DC. Daughter confirms pt's Medicare is still active with [employer] as a secondary. CSW to send out referrals, TOC will continue to follow for DC needs.  Bed offers provided.  Expected Discharge Plan: [place] Barriers to Discharge: SNF Pending bed offer, Continued Medical Work up  Expected Discharge Plan and Services Expected Discharge Plan: [place] Choice: [place] arrangements for the past 2 months: Single Family Home                                       Social Determinants of Health (SDOH) Interventions    Readmission Risk Interventions     No data to display

## 2022-05-17 NOTE — Progress Notes (Signed)
Mobility Specialist - Progress Note   05/17/22 1200  Mobility  Activity Ambulated with assistance in hallway  Level of Assistance Contact guard assist, steadying assist  Assistive Device Front wheel walker  Distance Ambulated (ft) 60 ft  Activity Response Tolerated well  Mobility Referral Yes  $Mobility charge 1 Mobility    Pt received in bed agreeable to mobility. Left in recliner w/ all needs met and RN present.  [place] Specialist Please contact via SecureChat or Rehab office at [phone]

## 2022-05-17 NOTE — Progress Notes (Signed)
[facility]  PROGRESS NOTE  Sanna Porcaro VOJ:[id number] DOB: 27-Mar-1932 DOA: 05/11/2022 PCP: Tonia Ghent, MD   Brief HPI:   88 year old female with a history of interstitial lung disease, hyperlipidemia, hypothyroidism, recent apical pneumothorax in October 9937, chronic diastolic heart failure was admitted for newly diagnosed right lower lobe pulmonary embolism.  She was seen at pulmonary clinic with worsening dyspnea, found to have D-dimer elevation, outpatient CTA chest ordered which was positive for PE. Patient started on IV heparin.    Subjective   Patient seen and examined, denies chest pain or shortness of breath.  Awaiting to go to skilled nursing facility   Assessment/Plan:   Acute pulmonary embolism -Started on heparin gtt -Pulmonology consulted, no role for thrombolytics or advanced intervention based on location of clot -Bilateral lower extremity Dopplers obtained today shows DVT bilaterally involving distal femoral veins in both lower extremities -IV heparin was switched to Eliquis -Currently not requiring oxygen - echocardiogram shows mildly elevated pulmonary artery systolic pressure; right ventricular function is normal  Hypothyroidism -Continue Synthroid  Hyperlipidemia -Continue Lipitor  Idiopathic pulmonary fibrosis -Followed by pulmonology as outpatient  Pneumothorax -Conservative management  Chronic diastolic heart failure -Euvolemic  Compression fracture of T9 vertebra -Continue pain management  Patient to go to skilled nursing facility for rehab  Medications     apixaban  10 mg Oral BID   Followed by   Derrill Memo ON 05/22/2022] apixaban  5 mg Oral BID   atorvastatin  10 mg Oral Daily   feeding supplement  237 mL Oral BID BM   Gerhardt's butt cream   Topical BID   levothyroxine  50 mcg Oral Q0600   multivitamin with minerals  1 tablet Oral Daily     Data Reviewed:   CBG:  No results for input(s): "GLUCAP" in the last 168  hours.  SpO2: 99 % O2 Flow Rate (L/min): 2 L/min    Vitals:   05/16/22 1757 05/16/22 1954 05/17/22 0408 05/17/22 0803  BP: 111/64 104/66 (!) 101/47 107/63  Pulse: 91 87 81 89  Resp: 17   16  Temp: 98.1 F (36.7 C) 98 F (36.7 C) (!) 97.5 F (36.4 C) 97.7 F (36.5 C)  TempSrc: Oral Oral Oral Oral  SpO2: 100% 98% 97% 99%  Weight:      Height:          Data Reviewed:  Basic Metabolic Panel: Recent Labs  Lab 05/11/22 1005 05/11/22 2158 05/12/22 2050 05/13/22 0336 05/14/22 0352  NA 134* 133*  --  132* 131*  K 4.2 4.2  --  3.5 3.8  CL 94* 92*  --  98 96*  CO2 32 31  --  27 25  GLUCOSE 115* 120*  --  127* 95  BUN 17 24*  --  14 7*  CREATININE 0.78 1.31*  --  0.61 0.54  CALCIUM 10.2 10.1  --  8.2* 8.9  MG  --   --  1.5* 2.0  --   PHOS  --   --  3.1 3.2  --     CBC: Recent Labs  Lab 05/11/22 1005 05/11/22 2158 05/12/22 2050 05/13/22 0336 05/14/22 0352 05/15/22 1335 05/16/22 0441 05/17/22 0336  WBC 8.9 8.8 7.9 7.1 6.4 8.4 7.9 7.0  NEUTROABS 6.6 6.9 5.7  --   --   --   --   --   HGB 12.8 11.9* 10.9* 10.1* 11.3* 10.8* 11.0* 10.4*  HCT 37.9 35.1* 31.0* 28.6* 34.2* 32.2* 32.6* 29.8*  MCV 95.7 95.4 94.8 94.4 96.3 96.7 96.2 94.0  PLT 303.0 266 197 161 154 207 216 214    LFT Recent Labs  Lab 05/11/22 1005 05/11/22 2158 05/13/22 0336  AST '23 21 23  '$ ALT '16 14 16  '$ ALKPHOS 66 60 42  BILITOT 1.4* 1.1 1.1  PROT 8.2 7.8 6.0*  ALBUMIN 4.3 4.2 2.8*     Antibiotics: Anti-infectives (From admission, onward)    None        DVT prophylaxis: Eliquis  Code Status: DNR  Family Communication: No family at bedside   CONSULTS    Objective    Physical Examination:  General-appears in no acute distress Heart-S1-S2, regular, no murmur auscultated Lungs-clear to auscultation bilaterally Abdomen-soft, nontender, no organomegaly Extremities-no edema in the lower extremities Neuro-alert, oriented x3, no focal deficit noted   Status is: Inpatient:              [name]   [facility] If 7PM-7AM, please contact night-coverage at [email or web address], Office  [phone]   05/17/2022, 10:05 AM  LOS: 5 days

## 2022-05-17 NOTE — NC FL2 (Signed)
[place] LEVEL OF CARE FORM     IDENTIFICATION  Patient Name: Tara Harris Birthdate: 01-03-1932 Sex: female Admission Date (Current Location): 05/11/2022  [facility] [place] Number:  Herbalist and Address:  [facility], [place] [address], [place], [place] [address]      Provider Number: [id number]  Attending Physician Name and Address:  [name], MD  Relative Name and Phone Number:  Abel Presto, [phone]    Current Level of Care: Hospital Recommended Level of Care: [place] Prior Approval Number:    Date Approved/Denied:   PASRR Number: [id number]  Discharge Plan: SNF    Current Diagnoses: Patient Active Problem List   Diagnosis Date Noted   Protein-calorie malnutrition, severe 05/13/2022   Acute pulmonary embolism ([place]) 05/12/2022   AKI (acute kidney injury) ([place]) 05/12/2022   Pneumothorax 05/12/2022   Chronic diastolic CHF (congestive heart failure) ([place]) 05/12/2022   Compression fracture of T9 vertebra ([place]) 05/12/2022   Constipation 02/03/2022   IPF (idiopathic pulmonary fibrosis) ([place]) 01/01/2022   Light headedness 07/06/2021   Abnormal coordination 07/06/2021   Ataxia 07/06/2021   Fall at home 12/03/2020   Paresthesia 12/03/2020   Memory change 12/03/2020   Weight loss 12/03/2020   Syncope and collapse 02/17/2020   Orthostatic hypotension 02/17/2020   Low back pain 01/08/2020   Abnormal chest x-ray 12/14/2019   Neuropathy 08/03/2019   Caregiver role strain 02/26/2019   Advance care planning 02/26/2019   Abnormal gait 05/22/2018   Age-related osteoporosis without current pathological fracture 05/22/2018   Hyperglycemia 07/03/2015   Nocturnal leg cramps 07/03/2015   Hypothyroidism 02/20/2015   Urinary incontinence 05/31/2011   UNSPECIFIED VITAMIN D DEFICIENCY 01/07/2010   ARTHRITIS, LEFT KNEE 01/07/2010   IRRITABLE BOWEL SYNDROME 08/07/2009   Hyperlipidemia 12/26/2007    Anemia 12/26/2007   ALLERGIC RHINITIS, CHRONIC 12/26/2007   ARTHRITIS 12/26/2007    Orientation RESPIRATION BLADDER Height & Weight     Self, Place  Normal Incontinent, External catheter Weight: 84 lb (38.1 kg) Height:  '5\' 7"'$  (170.2 cm)  BEHAVIORAL SYMPTOMS/MOOD NEUROLOGICAL BOWEL NUTRITION STATUS      Continent Diet (See DC summary)  AMBULATORY STATUS COMMUNICATION OF NEEDS Skin   Extensive Assist Verbally Skin abrasions (L upper head abrasion, L leg abrasion)                       Personal Care Assistance Level of Assistance  Bathing, Feeding, Dressing Bathing Assistance: Maximum assistance Feeding assistance: Maximum assistance Dressing Assistance: Maximum assistance     Functional Limitations Info  Sight, Hearing, Speech Sight Info: Adequate Hearing Info: Adequate      SPECIAL CARE FACTORS FREQUENCY  OT (By licensed OT), PT (By licensed PT)     PT Frequency: 5x week OT Frequency: 5x week            Contractures Contractures Info: Not present    Additional Factors Info  Code Status, Allergies Code Status Info: DNR Allergies Info: NKA           Current Medications (05/17/2022):  This is the current hospital active medication list Current Facility-Administered Medications  Medication Dose Route Frequency Provider Last Rate Last Admin   acetaminophen (TYLENOL) tablet 650 mg  650 mg Oral Q6H PRN Toy Baker, MD   650 mg at 05/16/22 1759   Or   acetaminophen (TYLENOL) suppository 650 mg  650 mg Rectal Q6H PRN Doutova, Anastassia,  MD       ALPRAZolam Duanne Moron) tablet 0.25 mg  0.25 mg Oral BID PRN Toy Baker, MD   0.25 mg at 05/15/22 2123   apixaban (ELIQUIS) tablet 10 mg  10 mg Oral BID Paytes, Earl Gala, RPH   10 mg at 05/17/22 5597   Followed by   Derrill Memo ON 05/22/2022] apixaban (ELIQUIS) tablet 5 mg  5 mg Oral BID Paytes, Austin A, RPH       atorvastatin (LIPITOR) tablet 10 mg  10 mg Oral Daily Doutova, Anastassia, MD   10 mg at  05/16/22 2101   feeding supplement (ENSURE ENLIVE / ENSURE PLUS) liquid 237 mL  237 mL Oral BID BM [name], MD   237 mL at 05/17/22 4163   Gerhardt's butt cream   Topical BID [name], MD   Given at 05/17/22 [phone]   HYDROcodone-acetaminophen (NORCO/VICODIN) 5-325 MG per tablet 1-2 tablet  1-2 tablet Oral Q4H PRN Toy Baker, MD   2 tablet at 05/13/22 6468   levothyroxine (SYNTHROID) tablet 50 mcg  50 mcg Oral Q0600 Toy Baker, MD   50 mcg at 05/17/22 0601   multivitamin with minerals tablet 1 tablet  1 tablet Oral Daily [name], MD   1 tablet at 05/17/22 0321     Discharge Medications: Please see discharge summary for a list of discharge medications.  Relevant Imaging Results:  Relevant Lab Results:   Additional Information SS# [employer], [place]

## 2022-05-18 DIAGNOSIS — J939 Pneumothorax, unspecified: Secondary | ICD-10-CM | POA: Diagnosis not present

## 2022-05-18 DIAGNOSIS — I5032 Chronic diastolic (congestive) heart failure: Secondary | ICD-10-CM | POA: Diagnosis not present

## 2022-05-18 DIAGNOSIS — I2699 Other pulmonary embolism without acute cor pulmonale: Secondary | ICD-10-CM | POA: Diagnosis not present

## 2022-05-18 DIAGNOSIS — S22070A Wedge compression fracture of T9-T10 vertebra, initial encounter for closed fracture: Secondary | ICD-10-CM | POA: Diagnosis not present

## 2022-05-18 LAB — CBC
HCT: 31.6 % — ABNORMAL LOW (ref 36.0–46.0)
Hemoglobin: 11 g/dL — ABNORMAL LOW (ref 12.0–15.0)
MCH: 33 pg (ref 26.0–34.0)
MCHC: 34.8 g/dL (ref 30.0–36.0)
MCV: 94.9 fL (ref 80.0–100.0)
Platelets: 205 10*3/uL (ref 150–400)
RBC: 3.33 MIL/uL — ABNORMAL LOW (ref 3.87–5.11)
RDW: 13.2 % (ref 11.5–15.5)
WBC: 6.3 10*3/uL (ref 4.0–10.5)
nRBC: 0 % (ref 0.0–0.2)

## 2022-05-18 NOTE — TOC CM/SW Note (Signed)
Patient discussed in QC meeting today with TOC Director Zack Brooks and leader Jessica Scinto   

## 2022-05-18 NOTE — Care Management Important Message (Signed)
Important Message  Patient Details  Name: Tara Harris MRN: [id number] Date of Birth: August 20, 1931   Medicare Important Message Given:  Yes     Hannah Beat 05/18/2022, 11:57 AM

## 2022-05-18 NOTE — Progress Notes (Signed)
[facility]  PROGRESS NOTE  Tara Harris OJJ:[id number] DOB: 1932/01/31 DOA: 05/11/2022 PCP: Tonia Ghent, MD   Brief HPI:   88 year old female with a history of interstitial lung disease, hyperlipidemia, hypothyroidism, recent apical pneumothorax in October 9371, chronic diastolic heart failure was admitted for newly diagnosed right lower lobe pulmonary embolism.  She was seen at pulmonary clinic with worsening dyspnea, found to have D-dimer elevation, outpatient CTA chest ordered which was positive for PE. Patient started on IV heparin.    Subjective   Patient seen and examined, denies chest or shortness of breath.   Assessment/Plan:   Acute pulmonary embolism -Started on heparin gtt -Pulmonology consulted, no role for thrombolytics or advanced intervention based on location of clot -Bilateral lower extremity Dopplers obtained today shows DVT bilaterally involving distal femoral veins in both lower extremities -IV heparin was switched to Eliquis -Currently not requiring oxygen - echocardiogram shows mildly elevated pulmonary artery systolic pressure; right ventricular function is normal  Hypothyroidism -Continue Synthroid  Hyperlipidemia -Continue Lipitor  Idiopathic pulmonary fibrosis -Followed by pulmonology as outpatient  Pneumothorax -Conservative management  Chronic diastolic heart failure -Euvolemic  Compression fracture of T9 vertebra -Continue pain management  Patient to go to skilled nursing facility for rehab  Medications     apixaban  10 mg Oral BID   Followed by   Derrill Memo ON 05/22/2022] apixaban  5 mg Oral BID   atorvastatin  10 mg Oral Daily   feeding supplement  237 mL Oral BID BM   Gerhardt's butt cream   Topical BID   levothyroxine  50 mcg Oral Q0600   multivitamin with minerals  1 tablet Oral Daily     Data Reviewed:   CBG:  No results for input(s): "GLUCAP" in the last 168 hours.  SpO2: 100 % O2 Flow Rate (L/min): 2  L/min    Vitals:   05/17/22 1431 05/17/22 2108 05/18/22 0653 05/18/22 0947  BP: 117/65 106/68 107/62 121/69  Pulse: 76 78 73 72  Resp: '16 17 18 16  '$ Temp: 97.8 F (36.6 C) 98 F (36.7 C) 98.4 F (36.9 C) (!) 97.5 F (36.4 C)  TempSrc: Oral Oral Oral Axillary  SpO2: 100% 99% 100% 100%  Weight:      Height:          Data Reviewed:  Basic Metabolic Panel: Recent Labs  Lab 05/11/22 2158 05/12/22 2050 05/13/22 0336 05/14/22 0352  NA 133*  --  132* 131*  K 4.2  --  3.5 3.8  CL 92*  --  98 96*  CO2 31  --  27 25  GLUCOSE 120*  --  127* 95  BUN 24*  --  14 7*  CREATININE 1.31*  --  0.61 0.54  CALCIUM 10.1  --  8.2* 8.9  MG  --  1.5* 2.0  --   PHOS  --  3.1 3.2  --     CBC: Recent Labs  Lab 05/11/22 2158 05/12/22 2050 05/13/22 0336 05/14/22 0352 05/15/22 1335 05/16/22 0441 05/17/22 0336 05/18/22 0224  WBC 8.8 7.9   < > 6.4 8.4 7.9 7.0 6.3  NEUTROABS 6.9 5.7  --   --   --   --   --   --   HGB 11.9* 10.9*   < > 11.3* 10.8* 11.0* 10.4* 11.0*  HCT 35.1* 31.0*   < > 34.2* 32.2* 32.6* 29.8* 31.6*  MCV 95.4 94.8   < > 96.3 96.7 96.2 94.0 94.9  PLT 266  197   < > 154 207 216 214 205   < > = values in this interval not displayed.    LFT Recent Labs  Lab 05/11/22 2158 05/13/22 0336  AST 21 23  ALT 14 16  ALKPHOS 60 42  BILITOT 1.1 1.1  PROT 7.8 6.0*  ALBUMIN 4.2 2.8*     Antibiotics: Anti-infectives (From admission, onward)    None        DVT prophylaxis: Eliquis  Code Status: DNR  Family Communication: No family at bedside   CONSULTS    Objective    Physical Examination:  General-appears in no acute distress Heart-S1-S2, regular, no murmur auscultated Lungs-clear to auscultation bilaterally Abdomen-soft, nontender, no organomegaly Extremities-no edema in the lower extremities Neuro-alert, oriented x3, no focal deficit noted  Status is: Inpatient:             [name]   [facility] If 7PM-7AM, please contact  night-coverage at [email or web address], Office  [phone]   05/18/2022, 10:23 AM  LOS: 6 days

## 2022-05-18 NOTE — Progress Notes (Signed)
Physical Therapy Treatment Patient Details Name: Tara Harris MRN: [id number] DOB: 24-Jan-1932 Today's Date: 05/18/2022   History of Present Illness 88 yo female with onset of SOB and elevated d-dimer was admitted on 12/5 with new dx of PE, with 10-15% pneumothorax on L side and L femoral DVT dx yesterday.  **clearance from MD for mobility was given with DVT's and PE**.  PMHx:  ILD, HLD, hypothyroidism, apical pneumothorax, CHF,    PT Comments    Pt was seen for mobility to side of bed and to stand then step to chair.  Pt is reluctant but finally successful with movement and was able to have sats 92% at side of bed.  Does demonstrate fluctuations from moving and will continue to recommend her to have SNF care to avoid overdoing her tolerances without being continually monitored for sats, HR and endurance.  Acute PT is progressing standing and steps toward gait as pt can tolerate.  Follow up for all goals of acute PT.   Recommendations for follow up therapy are one component of a multi-disciplinary discharge planning process, led by the attending physician.  Recommendations may be updated based on patient status, additional functional criteria and insurance authorization.  Follow Up Recommendations  Skilled nursing-short term rehab (<3 hours/day) Can patient physically be transported by private vehicle: No   Assistance Recommended at Discharge Frequent or constant Supervision/Assistance  Patient can return home with the following A lot of help with walking and/or transfers;A lot of help with bathing/dressing/bathroom;Assistance with cooking/housework;Direct supervision/assist for medications management;Direct supervision/assist for financial management;Assist for transportation;Help with stairs or ramp for entrance   Equipment Recommendations  None recommended by PT    Recommendations for Other Services       Precautions / Restrictions Precautions Precautions: Fall Precaution  Comments: recent DVT and PE history Restrictions Weight Bearing Restrictions: No     Mobility  Bed Mobility Overal bed mobility: Needs Assistance Bed Mobility: Rolling, Sidelying to Sit Rolling: Mod assist Sidelying to sit: Mod assist       General bed mobility comments: guardigg movement reluctant and delaying to move    Transfers Overall transfer level: Needs assistance Equipment used: Rolling walker (2 wheels) Transfers: Sit to/from Stand Sit to Stand: Mod assist           General transfer comment: mod to stand and mod to pivot to chair    Ambulation/Gait               General Gait Details: declined   Stairs             Wheelchair Mobility    Modified Rankin (Stroke Patients Only)       Balance Overall balance assessment: Needs assistance Sitting-balance support: Feet supported Sitting balance-Leahy Scale: Fair       Standing balance-Leahy Scale: Poor                              Cognition Arousal/Alertness: Awake/alert Behavior During Therapy: Flat affect Overall Cognitive Status: No family/caregiver present to determine baseline cognitive functioning                                 General Comments: slow to verbally respond        Exercises      General Comments General comments (skin integrity, edema, etc.): pt is fairly aversive to moving and wiht encouragement  was finally able to sidestep to chair and sit with legs supported      Pertinent Vitals/Pain Pain Assessment Pain Assessment: Faces Faces Pain Scale: Hurts a little bit Pain Location: chest Pain Descriptors / Indicators: Guarding Pain Intervention(s): Monitored during session, Repositioned    Home Living                          Prior Function            PT Goals (current goals can now be found in the care plan section) Acute Rehab PT Goals Patient Stated Goal: to return home with her caregiver three days a  week Progress towards PT goals: Progressing toward goals    Frequency    Min 3X/week      PT Plan Current plan remains appropriate    Co-evaluation              AM-PAC PT "6 Clicks" Mobility   Outcome Measure  Help needed turning from your back to your side while in a flat bed without using bedrails?: A Lot Help needed moving from lying on your back to sitting on the side of a flat bed without using bedrails?: A Lot Help needed moving to and from a bed to a chair (including a wheelchair)?: A Lot Help needed standing up from a chair using your arms (e.g., wheelchair or bedside chair)?: A Lot Help needed to walk in hospital room?: Total Help needed climbing 3-5 steps with a railing? : Total 6 Click Score: 10    End of Session Equipment Utilized During Treatment: Gait belt;Oxygen Activity Tolerance: Patient limited by fatigue;Patient limited by pain Patient left: in chair;with call bell/phone within reach;with chair alarm set Nurse Communication: Mobility status PT Visit Diagnosis: Unsteadiness on feet (R26.81);Muscle weakness (generalized) (M62.81);Difficulty in walking, not elsewhere classified (R26.2)     Time: 8177-1165 PT Time Calculation (min) (ACUTE ONLY): 25 min  Charges:  $Therapeutic Activity: 23-37 mins   Ramond Dial 05/18/2022, 6:27 PM  Mee Hives, PT PhD Acute Rehab Dept. Number: [facility]

## 2022-05-18 NOTE — TOC Progression Note (Signed)
Transition of Care [facility]) - Progression Note    Patient Details  Name: Safiya Girdler MRN: [id number] Date of Birth: [birth year]  Transition of [facility] CM/SW [place], [place] Phone Number: 05/18/2022, 12:05 PM  Clinical Narrative:    CSW followed up with pt's dtr [name], to confirm bed choice. Dtr has chosen [facility]. [facility] will have a bed available tomorrow. Medical team and family notified of delay. CSW requested a signed DNR for transport. TOC will continue to follow for DC needs.   Expected Discharge Plan: [place] Barriers to Discharge: SNF Pending bed offer, Continued Medical Work up  Expected Discharge Plan and Services Expected Discharge Plan: [place] Choice: [place] arrangements for the past 2 months: Single Family Home                                       Social Determinants of Health (SDOH) Interventions    Readmission Risk Interventions     No data to display

## 2022-05-18 NOTE — Plan of Care (Signed)

## 2022-05-19 DIAGNOSIS — I2699 Other pulmonary embolism without acute cor pulmonale: Secondary | ICD-10-CM | POA: Diagnosis not present

## 2022-05-19 LAB — CBC
HCT: 28.4 % — ABNORMAL LOW (ref 36.0–46.0)
Hemoglobin: 9.9 g/dL — ABNORMAL LOW (ref 12.0–15.0)
MCH: 32.7 pg (ref 26.0–34.0)
MCHC: 34.9 g/dL (ref 30.0–36.0)
MCV: 93.7 fL (ref 80.0–100.0)
Platelets: 242 10*3/uL (ref 150–400)
RBC: 3.03 MIL/uL — ABNORMAL LOW (ref 3.87–5.11)
RDW: 13.3 % (ref 11.5–15.5)
WBC: 8.6 10*3/uL (ref 4.0–10.5)
nRBC: 0 % (ref 0.0–0.2)

## 2022-05-19 LAB — BASIC METABOLIC PANEL
Anion gap: 8 (ref 5–15)
BUN: 22 mg/dL (ref 8–23)
CO2: 27 mmol/L (ref 22–32)
Calcium: 9.1 mg/dL (ref 8.9–10.3)
Chloride: 92 mmol/L — ABNORMAL LOW (ref 98–111)
Creatinine, Ser: 0.67 mg/dL (ref 0.44–1.00)
GFR, Estimated: >60 mL/min (ref >60)
Glucose, Bld: 115 mg/dL — ABNORMAL HIGH (ref 70–99)
Potassium: 4.7 mmol/L (ref 3.5–5.1)
Sodium: 127 mmol/L — ABNORMAL LOW (ref 135–145)

## 2022-05-19 MED ORDER — SODIUM CHLORIDE 0.9 % IV SOLN: INTRAVENOUS | Status: AC

## 2022-05-19 NOTE — Progress Notes (Signed)
Occupational Therapy Treatment Patient Details Name: Tara Harris MRN: [id number] DOB: [birth year] Today's Date: 05/19/2022   History of present illness 88 yo female with onset of SOB and elevated d-dimer was admitted on 12/5 with new dx of PE, with 10-15% pneumothorax on L side and L femoral DVT dx yesterday.  **clearance from MD for mobility was given with DVT's and PE**.  PMHx:  ILD, HLD, hypothyroidism, apical pneumothorax, CHF,   OT comments  Mod assist for bed mobility and to stand with RW. Transferred to chair with min assist and completed grooming with set up, changed soiled gown with moderate assistance and required total assist for pericare. Left up in chair with needs met, set up for breakfast. Continues to be appropriate for SNF level rehab.    Recommendations for follow up therapy are one component of a multi-disciplinary discharge planning process, led by the attending physician.  Recommendations may be updated based on patient status, additional functional criteria and insurance authorization.    Follow Up Recommendations  Skilled nursing-short term rehab (<3 hours/day)     Assistance Recommended at Discharge Frequent or constant Supervision/Assistance  Patient can return home with the following  A little help with walking and/or transfers;A lot of help with bathing/dressing/bathroom;Assistance with cooking/housework;Direct supervision/assist for medications management;Direct supervision/assist for financial management;Assist for transportation;Help with stairs or ramp for entrance   Equipment Recommendations  Other (comment) (defer to next venue)    Recommendations for Other Services      Precautions / Restrictions Precautions Precautions: Fall Restrictions Weight Bearing Restrictions: No       Mobility Bed Mobility Overal bed mobility: Needs Assistance Bed Mobility: Rolling, Sidelying to Sit Rolling: Mod assist Sidelying to sit: Min guard       General  bed mobility comments: pt able to raise trunk without physical assist    Transfers Overall transfer level: Needs assistance Equipment used: Rolling walker (2 wheels) Transfers: Sit to/from Stand, Bed to chair/wheelchair/BSC Sit to Stand: Mod assist     Step pivot transfers: Min assist     General transfer comment: assist to rise and steady     Balance Overall balance assessment: Needs assistance Sitting-balance support: Feet supported Sitting balance-Leahy Scale: Fair     Standing balance support: Bilateral upper extremity supported, During functional activity Standing balance-Leahy Scale: Poor                             ADL either performed or assessed with clinical judgement   ADL Overall ADL's : Needs assistance/impaired Eating/Feeding: Set up;Sitting   Grooming: Wash/dry hands;Wash/dry face;Sitting;Set up           Upper Body Dressing : Moderate assistance;Sitting           Toileting- Clothing Manipulation and Hygiene: Total assistance;Sit to/from stand              Extremity/Trunk Assessment              Vision       Perception     Praxis      Cognition Arousal/Alertness: Awake/alert Behavior During Therapy: Flat affect Overall Cognitive Status: No family/caregiver present to determine baseline cognitive functioning                                 General Comments: slow processing, able to make needs known  Exercises      Shoulder Instructions       General Comments      Pertinent Vitals/ Pain       Pain Assessment Pain Assessment: Faces Faces Pain Scale: Hurts little more Pain Location: R flank Pain Descriptors / Indicators: Guarding Pain Intervention(s): Monitored during session, Repositioned  Home Living                                          Prior Functioning/Environment              Frequency  Min 2X/week        Progress Toward Goals  OT  Goals(current goals can now be found in the care plan section)  Progress towards OT goals: Progressing toward goals  Acute Rehab OT Goals OT Goal Formulation: Patient unable to participate in goal setting Time For Goal Achievement: 05/30/22 Potential to Achieve Goals: Good  Plan Discharge plan remains appropriate    Co-evaluation                 AM-PAC OT "6 Clicks" Daily Activity     Outcome Measure   Help from another person eating meals?: A Little Help from another person taking care of personal grooming?: A Little Help from another person toileting, which includes using toliet, bedpan, or urinal?: Total Help from another person bathing (including washing, rinsing, drying)?: A Lot Help from another person to put on and taking off regular upper body clothing?: A Little Help from another person to put on and taking off regular lower body clothing?: Total 6 Click Score: 13    End of Session Equipment Utilized During Treatment: Gait belt;Rolling walker (2 wheels);Oxygen (2L)  OT Visit Diagnosis: Unsteadiness on feet (R26.81);Other abnormalities of gait and mobility (R26.89);Muscle weakness (generalized) (M62.81);Other symptoms and signs involving cognitive function   Activity Tolerance Patient tolerated treatment well   Patient Left in chair;with call bell/phone within reach;with chair alarm set   Nurse Communication          Time: [phone] OT Time Calculation (min): 25 min  Charges: OT General Charges $OT Visit: 1 Visit OT Treatments $Self Care/Home Management : 23-37 mins  Cleta Alberts, OTR/L Acute Rehabilitation Services Office: [phone]  Malka So 05/19/2022, 9:17 AM

## 2022-05-19 NOTE — Progress Notes (Signed)
Mobility Specialist - Progress Note   05/19/22 1200  Mobility  Activity Transferred to/from [facility]  Level of Assistance Moderate assist, patient does 50-74%  Assistive Device Front wheel walker  Distance Ambulated (ft) 2 ft  Activity Response Tolerated well  $Mobility charge 1 Mobility    Pt received in recliner requesting to use BSC. ModA to stand and pivot. Left in recliner w/ call bell within reach and all needs met.   [place] Specialist Please contact via SecureChat or Rehab office at [phone]

## 2022-05-19 NOTE — TOC Progression Note (Signed)
Transition of Care [facility]) - Progression Note    Patient Details  Name: Tara Harris MRN: [id number] Date of Birth: [birth year]  [facility]) CM/SW Contact  Coralee P[place], [place] Phone Number: 05/19/2022, 11:59 AM  Clinical Narrative:    CSW notified by MD that pt is NMR to DC today. CSW updated facility and they are agreeable to admitting tomorrow. CSW updated dtr. TOC will continue to follow for DC needs.   Expected Discharge Plan: [place] Barriers to Discharge: SNF Pending bed offer, Continued Medical Work up  Expected Discharge Plan and Services Expected Discharge Plan: [place] Choice: [place] arrangements for the past 2 months: Single Family Home                                       Social Determinants of Health (SDOH) Interventions    Readmission Risk Interventions     No data to display

## 2022-05-19 NOTE — Progress Notes (Signed)
PROGRESS NOTE    Tara Harris  YBO:[id number] DOB: Oct 28, 1931 DOA: 05/11/2022 PCP: Tonia Ghent, MD   Brief Narrative: 88 year old with past medical history significant for interstitial lung disease, hyperlipidemia, hypothyroidism, recent apical pneumothorax October 2778, chronic diastolic heart failure, admitted with new diagnosis of right lower lobe PE.  She was seen at pulmonology clinic with worsening dyspnea, found to have elevated D-dimer.  Outpatient CT chest PE.  She was started on heparin.  Now being transitioned to Eliquis Sodium lower today down to 127, plan to start low rate IV fluids.  Assessment & Plan:   Principal Problem:   Acute pulmonary embolism (HCC) Active Problems:   Hyperlipidemia   Hypothyroidism   Weight loss   IPF (idiopathic pulmonary fibrosis) (HCC)   AKI (acute kidney injury) (HCC)   Pneumothorax   Chronic diastolic CHF (congestive heart failure) (HCC)   Compression fracture of T9 vertebra (HCC)   Protein-calorie malnutrition, severe   1-Acute Pulmonary Embolism: -Treated with heparin drip. -Pulmonology was consulted no role for thrombolytics or advanced intervention. -Doppler lower extremity positive for bilateral DVT involving distal femoral veins -ECHO: shows mildly elevated pulmonary artery systolic pressure; right ventricular function is norma  -She was transition to Eliquis 12/09  Hyponatremia; sodium level down. Poor oral intake. Start IV fluids.   Hypothyroidism: Continue with Synthroid  Hyperlipidemia: Continue with Lipitor  Idiopathic pulmonary fibrosis: Follow-up with pulmonology as an outpatient  Pneumothorax: Continue with conservative management  Chronic diastolic heart failure: Euvolemic Compression fracture of T9: Continue with pain management    Nutrition Problem: Severe Malnutrition Etiology: chronic illness    Signs/Symptoms: percent weight loss, severe fat depletion, severe muscle depletion Percent weight  loss: 8.6 % (8.6% wt loss in < 6month)    Interventions: Ensure Enlive (each supplement provides 350kcal and 20 grams of protein), MVI  Estimated body mass index is 13.16 kg/m as calculated from the following:   Height as of this encounter: '5\' 7"'$  (1.702 m).   Weight as of this encounter: 38.1 kg.   DVT prophylaxis: Eliquis Code Status: DNR Family Communication: Care discussed with patient.  Disposition Plan:  Status is: Inpatient Remains inpatient appropriate because; IV fluids for hyponatremia.     Consultants:  Pulmonologist   Procedures:  None  Antimicrobials:    Subjective: She report poor oral  intake. Denies dyspnea.  Denies diarrhea.   Objective: Vitals:   05/18/22 1701 05/18/22 2230 05/19/22 0502 05/19/22 0809  BP: (!) 95/50 108/61 126/65 130/62  Pulse: 98 78 71 73  Resp: '16 20 20 16  '$ Temp: 97.7 F (36.5 C) 98 F (36.7 C) 97.6 F (36.4 C) (!) 97.4 F (36.3 C)  TempSrc: Oral Oral Oral Oral  SpO2: 100% 100% 100% 100%  Weight:      Height:        Intake/Output Summary (Last 24 hours) at 05/19/2022 1635 Last data filed at 05/19/2022 1516 Gross per 24 hour  Intake 196.5 ml  Output --  Net 196.5 ml   Filed Weights   05/11/22 2101  Weight: 38.1 kg    Examination:  General exam: Appears calm and comfortable  Respiratory system: Clear to auscultation. Respiratory effort normal. Cardiovascular system: S1 & S2 heard, RRR. No JVD, murmurs, rubs, gallops or clicks. No pedal edema. Gastrointestinal system: Abdomen is nondistended, soft and nontender. No organomegaly or masses felt. Normal bowel sounds heard. Central nervous system: Alert and oriented.  Extremities: Symmetric 5 x 5 power.    Data Reviewed: I  have personally reviewed following labs and imaging studies  CBC: Recent Labs  Lab 05/12/22 2050 05/13/22 0336 05/15/22 1335 05/16/22 0441 05/17/22 0336 05/18/22 0224 05/19/22 0302  WBC 7.9   < > 8.4 7.9 7.0 6.3 8.6  NEUTROABS 5.7   --   --   --   --   --   --   HGB 10.9*   < > 10.8* 11.0* 10.4* 11.0* 9.9*  HCT 31.0*   < > 32.2* 32.6* 29.8* 31.6* 28.4*  MCV 94.8   < > 96.7 96.2 94.0 94.9 93.7  PLT 197   < > 207 216 214 205 242   < > = values in this interval not displayed.   Basic Metabolic Panel: Recent Labs  Lab 05/12/22 2050 05/13/22 0336 05/14/22 0352 05/19/22 0302  NA  --  132* 131* 127*  K  --  3.5 3.8 4.7  CL  --  98 96* 92*  CO2  --  '27 25 27  '$ GLUCOSE  --  127* 95 115*  BUN  --  14 7* 22  CREATININE  --  0.61 0.54 0.67  CALCIUM  --  8.2* 8.9 9.1  MG 1.5* 2.0  --   --   PHOS 3.1 3.2  --   --    GFR: Estimated Creatinine Clearance: 29.2 mL/min (by C-G formula based on SCr of 0.67 mg/dL). Liver Function Tests: Recent Labs  Lab 05/13/22 0336  AST 23  ALT 16  ALKPHOS 42  BILITOT 1.1  PROT 6.0*  ALBUMIN 2.8*   No results for input(s): "LIPASE", "AMYLASE" in the last 168 hours. No results for input(s): "AMMONIA" in the last 168 hours. Coagulation Profile: No results for input(s): "INR", "PROTIME" in the last 168 hours. Cardiac Enzymes: Recent Labs  Lab 05/12/22 2050  CKTOTAL 87   BNP (last 3 results) No results for input(s): "PROBNP" in the last 8760 hours. HbA1C: No results for input(s): "HGBA1C" in the last 72 hours. CBG: No results for input(s): "GLUCAP" in the last 168 hours. Lipid Profile: No results for input(s): "CHOL", "HDL", "LDLCALC", "TRIG", "CHOLHDL", "LDLDIRECT" in the last 72 hours. Thyroid Function Tests: No results for input(s): "TSH", "T4TOTAL", "FREET4", "T3FREE", "THYROIDAB" in the last 72 hours. Anemia Panel: No results for input(s): "VITAMINB12", "FOLATE", "FERRITIN", "TIBC", "IRON", "RETICCTPCT" in the last 72 hours. Sepsis Labs: No results for input(s): "PROCALCITON", "LATICACIDVEN" in the last 168 hours.  No results found for this or any previous visit (from the past 240 hour(s)).       Radiology Studies: No results found.      Scheduled Meds:   apixaban  10 mg Oral BID   Followed by   Derrill Memo ON 05/22/2022] apixaban  5 mg Oral BID   atorvastatin  10 mg Oral Daily   feeding supplement  237 mL Oral BID BM   Gerhardt's butt cream   Topical BID   levothyroxine  50 mcg Oral Q0600   multivitamin with minerals  1 tablet Oral Daily   Continuous Infusions:  sodium chloride 75 mL/hr at 05/19/22 1238     LOS: 7 days    Time spent: 35 minutes    Haly Feher A Chasen Mendell, MD [facility]   If 7PM-7AM, please contact night-coverage [email or web address]  05/19/2022, 4:35 PM

## 2022-05-20 LAB — BASIC METABOLIC PANEL
Anion gap: 8 (ref 5–15)
BUN: 17 mg/dL (ref 8–23)
CO2: 24 mmol/L (ref 22–32)
Calcium: 8.8 mg/dL — ABNORMAL LOW (ref 8.9–10.3)
Chloride: 97 mmol/L — ABNORMAL LOW (ref 98–111)
Creatinine, Ser: 0.56 mg/dL (ref 0.44–1.00)
GFR, Estimated: >60 mL/min (ref >60)
Glucose, Bld: 93 mg/dL (ref 70–99)
Potassium: 4.2 mmol/L (ref 3.5–5.1)
Sodium: 129 mmol/L — ABNORMAL LOW (ref 135–145)

## 2022-05-20 MED ORDER — APIXABAN 5 MG PO TABS: 10.0000 mg | ORAL_TABLET | Freq: Two times a day (BID) | ORAL | 0 refills | Status: DC

## 2022-05-20 MED ORDER — ADULT MULTIVITAMIN W/MINERALS CH: 1.0000 | ORAL_TABLET | Freq: Every day | ORAL | 0 refills | Status: AC

## 2022-05-20 MED ORDER — SODIUM CHLORIDE 1 G PO TABS
1.0000 g | ORAL_TABLET | Freq: Every day | ORAL | Status: DC
  Administered 2022-05-20: 1 g via ORAL
  Filled 2022-05-20: qty 1

## 2022-05-20 MED ORDER — APIXABAN 5 MG PO TABS: 5.0000 mg | ORAL_TABLET | Freq: Two times a day (BID) | ORAL | 3 refills | Status: AC

## 2022-05-20 NOTE — Discharge Summary (Signed)
Physician Discharge Summary   Patient: Tara Harris MRN: [id number] DOB: Apr 11, 1932  Admit date:     05/11/2022  Discharge date: 05/20/22  Discharge Physician: Elmarie Shiley   PCP: Tonia Ghent, MD   Recommendations at discharge:    Needs follow up with Pulmonologist in 2-3  weeks.  Need B-met in 2 days.  Need chest x ray in 1-2 weeks.   Discharge Diagnoses: Principal Problem:   Acute pulmonary embolism (HCC) Active Problems:   Hyperlipidemia   Hypothyroidism   Weight loss   IPF (idiopathic pulmonary fibrosis) (HCC)   AKI (acute kidney injury) (HCC)   Pneumothorax   Chronic diastolic CHF (congestive heart failure) (HCC)   Compression fracture of T9 vertebra (HCC)   Protein-calorie malnutrition, severe  Resolved Problems:   * No resolved hospital problems. [facility] Course: 88 year old with past medical history significant for interstitial lung disease, hyperlipidemia, hypothyroidism, recent apical pneumothorax October 9643, chronic diastolic heart failure, admitted with new diagnosis of right lower lobe PE.  She was seen at pulmonology clinic with worsening dyspnea, found to have elevated D-dimer.  Outpatient CT chest PE.  She was started on heparin.  Now being transitioned to Eliquis Sodium lower  down to 127, improved with IV fluids. Resume sodium tablet at discharge.    Assessment and Plan: 1-Acute Pulmonary Embolism: -Treated with heparin drip. -Pulmonology was consulted no role for thrombolytics or advanced intervention. -Doppler lower extremity positive for bilateral DVT involving distal femoral veins -ECHO: shows mildly elevated pulmonary artery systolic pressure; right ventricular function is norma  -She was transition to Eliquis 12/09. Stable.    Hyponatremia; sodium level down. Improved with IV fluids. Resume sodium tablet at discharge.  Needs bmet in 48--72 hours   Hypothyroidism: Continue with Synthroid   Hyperlipidemia: Continue with  Lipitor   Idiopathic pulmonary fibrosis: Follow-up with pulmonology as an outpatient   Pneumothorax: Continue with conservative management.   Chronic diastolic heart failure: Euvolemic Compression fracture of T9: Continue with pain management   Daughter updated.        Consultants: Pulmonologist  Procedures performed: None Disposition: Skilled nursing facility Diet recommendation:  Discharge Diet Orders (From admission, onward)     Start     Ordered   05/20/22 0000  Diet general        05/20/22 0948           Regular diet DISCHARGE MEDICATION: Allergies as of 05/20/2022   No Known Allergies      Medication List     STOP taking these medications    diclofenac Sodium 1 % Gel Commonly known as: VOLTAREN   mirtazapine 7.5 MG tablet Commonly known as: REMERON       TAKE these medications    apixaban 5 MG Tabs tablet Commonly known as: ELIQUIS Take 2 tablets (10 mg total) by mouth 2 (two) times daily for 2 days.   apixaban 5 MG Tabs tablet Commonly known as: ELIQUIS Take 1 tablet (5 mg total) by mouth 2 (two) times daily. Start taking on: May 22, 2022   atorvastatin 10 MG tablet Commonly known as: LIPITOR TAKE 1 TABLET EACH MORNING What changed:  how much to take how to take this when to take this additional instructions   levothyroxine 50 MCG tablet Commonly known as: SYNTHROID TAKE 50 MCG BY MOUTH DAILY ON EMPTY STOMACH. What changed: See the new instructions.   multivitamin with minerals Tabs tablet Take 1 tablet by mouth daily. Start taking on:  May 21, 2022   sodium chloride 1 g tablet Take 1 tablet (1 g total) by mouth daily.   Vitamin D 50 MCG (2000 UT) Caps Take 1 capsule (2,000 Units total) by mouth daily.        Contact information for follow-up providers     Marshell Garfinkel, MD. Call in 1 week(s).   Specialty: Pulmonary Disease Why: Please call office to schedule appointment within one week after you are  discharged from the hospital Contact information: [address] [place] [address] [phone]              Contact information for after-discharge care     Destination     [facility] .   Service: Skilled Nursing Contact information: [address] [place] [phone]                    Discharge Exam: Danley Danker Weights   05/11/22 2101  Weight: 38.1 kg   General; NAD  Condition at discharge: stable  The results of significant diagnostics from this hospitalization (including imaging, microbiology, ancillary and laboratory) are listed below for reference.   Imaging Studies: ECHOCARDIOGRAM COMPLETE  Result Date: 05/13/2022    ECHOCARDIOGRAM REPORT   Patient Name:   Tara Harris Date of Exam: 05/13/2022 Medical Rec #:  [id number]          Height:       67.0 in Accession #:    [id number]         Weight:       84.0 lb Date of Birth:  [birth year]          BSA:          1.399 m Patient Age:    29 years           BP:           138/74 mmHg Patient Gender: F                  HR:           64 bpm. Exam Location:  Inpatient Procedure: 2D Echo Indications:    pulmonary embolus  History:        Patient has prior history of Echocardiogram examinations, most                 recent 02/18/2020. CHF; Risk Factors:Dyslipidemia.  Sonographer:    Johny Chess RDCS Referring Phys: [place]  Sonographer Comments: Suboptimal subcostal window. IMPRESSIONS  1. Left ventricular ejection fraction, by estimation, is 50 to 55%. The left ventricle has low normal function. The left ventricle has no regional wall motion abnormalities. Left ventricular diastolic parameters are indeterminate.  2. Right ventricular systolic function is normal. The right ventricular size is normal. There is mildly elevated pulmonary artery systolic pressure.  3. The mitral valve is normal in structure. Mild to moderate mitral valve  regurgitation. No evidence of mitral stenosis.  4. Tricuspid valve regurgitation is mild to moderate.  5. The aortic valve is normal in structure. Aortic valve regurgitation is mild. No aortic stenosis is present.  6. The inferior vena cava is normal in size with greater than 50% respiratory variability, suggesting right atrial pressure of 3 mmHg. FINDINGS  Left Ventricle: Left ventricular ejection fraction, by estimation, is 50 to 55%. The left ventricle has low normal function. The left ventricle has no regional wall motion abnormalities. The left ventricular  internal cavity size was normal in size. There is no left ventricular hypertrophy. Left ventricular diastolic parameters are indeterminate. Right Ventricle: The right ventricular size is normal. No increase in right ventricular wall thickness. Right ventricular systolic function is normal. There is mildly elevated pulmonary artery systolic pressure. The tricuspid regurgitant velocity is 2.90  m/s, and with an assumed right atrial pressure of 3 mmHg, the estimated right ventricular systolic pressure is 40.1 mmHg. Left Atrium: Left atrial size was normal in size. Right Atrium: Right atrial size was normal in size. Pericardium: There is no evidence of pericardial effusion. Mitral Valve: The mitral valve is normal in structure. Mild to moderate mitral valve regurgitation. No evidence of mitral valve stenosis. Tricuspid Valve: The tricuspid valve is normal in structure. Tricuspid valve regurgitation is mild to moderate. No evidence of tricuspid stenosis. Aortic Valve: The aortic valve is normal in structure. Aortic valve regurgitation is mild. No aortic stenosis is present. Pulmonic Valve: The pulmonic valve was normal in structure. Pulmonic valve regurgitation is mild to moderate. No evidence of pulmonic stenosis. Aorta: The aortic root is normal in size and structure. Venous: The inferior vena cava is normal in size with greater than 50% respiratory variability,  suggesting right atrial pressure of 3 mmHg. IAS/Shunts: No atrial level shunt detected by color flow Doppler.  LEFT VENTRICLE PLAX 2D LVIDd:         3.80 cm     Diastology LVIDs:         2.50 cm     LV e' medial:    6.53 cm/s LV PW:         0.60 cm     LV E/e' medial:  12.6 LV IVS:        0.60 cm     LV e' lateral:   7.51 cm/s LVOT diam:     1.70 cm     LV E/e' lateral: 10.9 LV SV:         39 LV SV Index:   28 LVOT Area:     2.27 cm  LV Volumes (MOD) LV vol d, MOD A4C: 41.4 ml LV vol s, MOD A4C: 21.1 ml LV SV MOD A4C:     41.4 ml RIGHT VENTRICLE RV Basal diam:  2.60 cm RV S prime:     12.60 cm/s TAPSE (M-mode): 2.0 cm LEFT ATRIUM             Index        RIGHT ATRIUM          Index LA diam:        2.80 cm 2.00 cm/m   RA Area:     8.11 cm LA Vol (A2C):   25.1 ml 17.95 ml/m  RA Volume:   16.20 ml 11.58 ml/m LA Vol (A4C):   20.2 ml 14.44 ml/m LA Biplane Vol: 22.7 ml 16.23 ml/m  AORTIC VALVE LVOT Vmax:   82.90 cm/s LVOT Vmean:  51.700 cm/s LVOT VTI:    0.172 m  AORTA Ao Root diam: 2.50 cm Ao Asc diam:  2.60 cm MITRAL VALVE                TRICUSPID VALVE MV Area (PHT): 3.60 cm     TR Peak grad:   33.6 mmHg MV Decel Time: 211 msec     TR Vmax:        290.00 cm/s MV E velocity: 82.10 cm/s MV A velocity: 100.00 cm/s  SHUNTS MV E/A ratio:  0.82  Systemic VTI:  0.17 m                             Systemic Diam: 1.70 cm Kardie Tobb DO Electronically signed by Berniece Salines DO Signature Date/Time: 05/13/2022/10:02:05 PM    Final    VAS [place] LOWER EXTREMITY VENOUS (DVT)  Result Date: 05/13/2022  Lower Venous DVT Study Patient Name:  [facility]  Date of Exam:   05/13/2022 Medical Rec #: [id number]           Accession #:    [id number] Date of Birth: [birth year]           Patient Gender: F Patient Age:   66 years Exam Location:  [facility] Procedure:      VAS [place] LOWER EXTREMITY VENOUS (DVT) Referring Phys: Nyoka Lint DOUTOVA --------------------------------------------------------------------------------   Indications: Pulmonary embolism.  Limitations: Poor patient position & unable to follow commands. Comparison Study: No previous exams Performing Technologist: Jody Hill RVT, RDMS  Examination Guidelines: A complete evaluation includes B-mode imaging, spectral Doppler, color Doppler, and power Doppler as needed of all accessible portions of each vessel. Bilateral testing is considered an integral part of a complete examination. Limited examinations for reoccurring indications may be performed as noted. The reflux portion of the exam is performed with the patient in reverse Trendelenburg.  +---------+---------------+---------+-----------+----------+--------------+ RIGHT    CompressibilityPhasicitySpontaneityPropertiesThrombus Aging +---------+---------------+---------+-----------+----------+--------------+ CFV      Full           Yes      Yes                                 +---------+---------------+---------+-----------+----------+--------------+ SFJ      Full                                                        +---------+---------------+---------+-----------+----------+--------------+ FV Prox  Full           Yes      Yes                                 +---------+---------------+---------+-----------+----------+--------------+ FV Mid   Full           Yes      Yes                                 +---------+---------------+---------+-----------+----------+--------------+ FV DistalPartial        Yes      Yes                  Acute          +---------+---------------+---------+-----------+----------+--------------+ PFV      Full                                                        +---------+---------------+---------+-----------+----------+--------------+ POP      Full           Yes  Yes                                 +---------+---------------+---------+-----------+----------+--------------+ PTV      Full                                                         +---------+---------------+---------+-----------+----------+--------------+ PERO     Full                                                        +---------+---------------+---------+-----------+----------+--------------+   +---------+---------------+---------+-----------+----------+--------------+ LEFT     CompressibilityPhasicitySpontaneityPropertiesThrombus Aging +---------+---------------+---------+-----------+----------+--------------+ CFV      Full           Yes      Yes                                 +---------+---------------+---------+-----------+----------+--------------+ SFJ      Full                                                        +---------+---------------+---------+-----------+----------+--------------+ FV Prox  Full           Yes      Yes                                 +---------+---------------+---------+-----------+----------+--------------+ FV Mid                                                not visualized +---------+---------------+---------+-----------+----------+--------------+ FV DistalPartial        No       No                   Acute          +---------+---------------+---------+-----------+----------+--------------+ PFV      Full                                                        +---------+---------------+---------+-----------+----------+--------------+ POP      Full           Yes      Yes                                 +---------+---------------+---------+-----------+----------+--------------+ PTV      Full                                                        +---------+---------------+---------+-----------+----------+--------------+  PERO     Full                                                        +---------+---------------+---------+-----------+----------+--------------+     Summary: BILATERAL: -No evidence of popliteal cyst, bilaterally. RIGHT: - Findings consistent with acute  deep vein thrombosis involving the right femoral vein (distal).  LEFT: - Findings consistent with acute deep vein thrombosis involving the left femoral vein (distal).  *See table(s) above for measurements and observations. Electronically signed by Jamelle Haring on 05/13/2022 at 5:40:39 PM.    Final    DG Chest 2 View  Result Date: 05/12/2022 CLINICAL DATA:  Shortness of breath and cough. EXAM: CHEST - 2 VIEW COMPARISON:  March 23, 2022 FINDINGS: The heart size and mediastinal contours are stable. Small lateral left pneumothorax probably unchanged compared to prior exam. Chronic increased interstitial markings/fibrosis identified throughout bilateral lungs. The visualized skeletal structures are stable. Scoliosis of spine. IMPRESSION: Small lateral left pneumothorax probably unchanged compared to prior exam. These results will be called to the ordering clinician or representative by the Radiologist Assistant, and communication documented in the PACS or [employer]. Electronically Signed   By: Abelardo Diesel M.D.   On: 05/12/2022 13:14   CT Angio Chest W/Cm &/Or Wo Cm  Result Date: 05/11/2022 CLINICAL DATA:  Elevated D dimer and chest pain. EXAM: CT ANGIOGRAPHY CHEST WITH CONTRAST TECHNIQUE: Multidetector CT imaging of the chest was performed using the standard protocol during bolus administration of intravenous contrast. Multiplanar CT image reconstructions and MIPs were obtained to evaluate the vascular anatomy. RADIATION DOSE REDUCTION: This exam was performed according to the departmental dose-optimization program which includes automated exposure control, adjustment of the mA and/or kV according to patient size and/or use of iterative reconstruction technique. CONTRAST:  68m OMNIPAQUE IOHEXOL 350 MG/ML SOLN COMPARISON:  CT chest abdomen and pelvis 03/23/2022 FINDINGS: Cardiovascular: There is adequate opacification of pulmonary arteries. There are segmental and subsegmental pulmonary emboli within  the right lower lobe. Aorta is normal in size. There is a no pericardial effusion. Heart is normal in size. There are atherosclerotic calcifications of the aorta. Mediastinum/Nodes: No enlarged mediastinal, hilar, or axillary lymph nodes. Thyroid gland, trachea, and esophagus demonstrate no significant findings. Lungs/Pleura: There is a small left pneumothorax (10%) which has mildly increased from prior. Fibrotic changes are seen peripherally throughout both lungs similar to prior examination. No focal pulmonary infarct or lung consolidation. No pleural effusion or pneumothorax. Upper Abdomen: No acute abnormality. 15 mm left renal cysts present. Musculoskeletal: There is new mild compression fracture of T9 which appears acute or subacute. No retropulsion of fracture fragments. There are healed right-sided rib fractures. Review of the MIP images confirms the above findings. IMPRESSION: 1. Acute segmental and subsegmental pulmonary emboli in the right lower lobe. No evidence for right heart strain. Positive for acute PE with CT evidence of right heart strain (RV/LV Ratio = 1.2) consistent with at least submassive (intermediate risk) PE. The presence of right heart strain has been associated with an increased risk of morbidity and mortality. 2. Small left pneumothorax (10%), mildly increased from prior. 3. New acute or subacute T9 compression fracture. Aortic Atherosclerosis (ICD10-I70.0). These results were called by telephone at the time of interpretation on 05/11/2022 at 7:00 pm to provider [facility], who verbally  acknowledged these results. Electronically Signed   By: Ronney Asters M.D.   On: 05/11/2022 19:01    Microbiology: Results for orders placed or performed during the hospital encounter of 06/13/20  SARS CORONAVIRUS 2 (TAT 6-24 HRS) Nasopharyngeal Nasopharyngeal Swab     Status: None   Collection Time: 06/13/20  9:08 AM   Specimen: Nasopharyngeal Swab  Result Value Ref Range Status   SARS  Coronavirus 2 NEGATIVE NEGATIVE Final    Comment: (NOTE) SARS-CoV-2 target nucleic acids are NOT DETECTED.  The SARS-CoV-2 RNA is generally detectable in upper and lower respiratory specimens during the acute phase of infection. Negative results do not preclude SARS-CoV-2 infection, do not rule out co-infections with other pathogens, and should not be used as the sole basis for treatment or other patient management decisions. Negative results must be combined with clinical observations, patient history, and epidemiological information. The expected result is Negative.  Fact Sheet for Patients: SugarRoll.be  Fact Sheet for Healthcare Providers: https://www.woods-mathews.com/  This test is not yet approved or cleared by the [place] FDA and  has been authorized for detection and/or diagnosis of SARS-CoV-2 by FDA under an Emergency Use Authorization (EUA). This EUA will remain  in effect (meaning this test can be used) for the duration of the COVID-19 declaration under Se ction 564(b)(1) of the Act, 21 U.S.C. section 360bbb-3(b)(1), unless the authorization is terminated or revoked sooner.  Performed at [facility], [place] [address] [place],  [address]     Labs: CBC: Recent Labs  Lab 05/15/22 1335 05/16/22 0441 05/17/22 0336 05/18/22 0224 05/19/22 0302  WBC 8.4 7.9 7.0 6.3 8.6  HGB 10.8* 11.0* 10.4* 11.0* 9.9*  HCT 32.2* 32.6* 29.8* 31.6* 28.4*  MCV 96.7 96.2 94.0 94.9 93.7  PLT 207 216 214 205 432   Basic Metabolic Panel: Recent Labs  Lab 05/14/22 0352 05/19/22 0302 05/20/22 0243  NA 131* 127* 129*  K 3.8 4.7 4.2  CL 96* 92* 97*  CO2 _0 GLUCOSE 95 115* 93  BUN 7* 22 17  CREATININE 0.54 0.67 0.56  CALCIUM 8.9 9.1 8.8*   Liver Function Tests: No results for input(s): "AST", "ALT", "ALKPHOS", "BILITOT", "PROT", "ALBUMIN" in the last 168 hours. CBG: No results for input(s): "GLUCAP" in the last  168 hours.  Discharge time spent: greater than 30 minutes.  Signed: Elmarie Shiley, MD [facility] 05/20/2022

## 2022-05-20 NOTE — Progress Notes (Signed)
Removed IV-CDI. Called report to [place] at [facility]. [facility] came to transport patient to facility.

## 2022-05-20 NOTE — Plan of Care (Signed)
  Problem: Education: Goal: Knowledge of General Education information will improve Description: Including pain rating scale, medication(s)/side effects and non-pharmacologic comfort measures Outcome: Progressing   Problem: Nutrition: Goal: Adequate nutrition will be maintained Outcome: Progressing   Problem: Coping: Goal: Level of anxiety will decrease Outcome: Progressing   Problem: Elimination: Goal: Will not experience complications related to bowel motility Outcome: Progressing Goal: Will not experience complications related to urinary retention Outcome: Progressing   Problem: Pain Managment: Goal: General experience of comfort will improve Outcome: Progressing   Problem: Skin Integrity: Goal: Risk for impaired skin integrity will decrease Outcome: Progressing   Problem: Safety: Goal: Ability to remain free from injury will improve Outcome: Progressing

## 2022-05-20 NOTE — TOC Transition Note (Signed)
Transition of Care [facility]) - CM/SW Discharge Note   Patient Details  Name: Tara Harris MRN: [id number] Date of Birth: [birth year]  Transition of [facility]) CM/SW Contact:  Coralee Pesa, [place] Phone Number: 05/20/2022, 11:10 AM   Clinical Narrative:    Pt to be transported to [facility] via [facility]. Nurse to call report to [phone]. Rm #[phone]   Final next level of care: Skilled Nursing Facility Barriers to Discharge: Barriers Resolved   Patient Goals and CMS Choice Patient states their goals for this hospitalization and ongoing recovery are:: Pt disoriented and not appropriate for goal setting. CMS Medicare.gov Compare Post Acute Care list provided to:: Patient Represenative (must comment) (Daughter) Choice offered to / list presented to : Adult Children  Discharge Placement              Patient chooses bed at: [facility] Patient to be transferred to facility by: [facility] Name of family member notified: Abel Presto Patient and family notified of of transfer: 05/20/22  Discharge Plan and Services     Post Acute Care Choice: [facility]                               Social Determinants of Health (SDOH) Interventions     Readmission Risk Interventions     No data to display

## 2022-05-20 NOTE — Progress Notes (Signed)
Called report to [facility].

## 2022-05-20 NOTE — Plan of Care (Signed)
  Problem: Education: Goal: Knowledge of General Education information will improve Description Including pain rating scale, medication(s)/side effects and non-pharmacologic comfort measures Outcome: Progressing   

## 2022-05-20 NOTE — Plan of Care (Signed)
  Problem: Education: Goal: Knowledge of General Education information will improve Description: Including pain rating scale, medication(s)/side effects and non-pharmacologic comfort measures 05/20/2022 1145 by Santa Lighter, RN Outcome: Adequate for Discharge 05/20/2022 1145 by Santa Lighter, RN Outcome: Progressing   Problem: Health Behavior/Discharge Planning: Goal: Ability to manage health-related needs will improve Outcome: Adequate for Discharge   Problem: Clinical Measurements: Goal: Ability to maintain clinical measurements within normal limits will improve Outcome: Adequate for Discharge Goal: Will remain free from infection Outcome: Adequate for Discharge Goal: Diagnostic test results will improve Outcome: Adequate for Discharge Goal: Respiratory complications will improve Outcome: Adequate for Discharge Goal: Cardiovascular complication will be avoided Outcome: Adequate for Discharge   Problem: Activity: Goal: Risk for activity intolerance will decrease Outcome: Adequate for Discharge   Problem: Nutrition: Goal: Adequate nutrition will be maintained Outcome: Adequate for Discharge   Problem: Coping: Goal: Level of anxiety will decrease Outcome: Adequate for Discharge   Problem: Elimination: Goal: Will not experience complications related to bowel motility Outcome: Adequate for Discharge Goal: Will not experience complications related to urinary retention Outcome: Adequate for Discharge   Problem: Pain Managment: Goal: General experience of comfort will improve Outcome: Adequate for Discharge   Problem: Safety: Goal: Ability to remain free from injury will improve Outcome: Adequate for Discharge   Problem: Skin Integrity: Goal: Risk for impaired skin integrity will decrease Outcome: Adequate for Discharge   Problem: Malnutrition  (NI-5.2) Goal: Food and/or nutrient delivery Description: Individualized approach for food/nutrient provision. Outcome:  Adequate for Discharge

## 2022-05-27 ENCOUNTER — Other Ambulatory Visit: Payer: Self-pay | Admitting: *Deleted

## 2022-05-27 NOTE — Patient Outreach (Signed)
Mrs. Potteiger resides in [facility]. Screening for potential [facility] care coordination services as benefit of insurance plan and PCP.   Made SNF social worker aware writer is following for transition plans and [facility] needs.   Will continue to follow.    Marthenia Rolling, MSN, RN,BSN [place] [facility] Coordinator [phone] (Direct dial)

## 2022-06-10 ENCOUNTER — Ambulatory Visit (INDEPENDENT_AMBULATORY_CARE_PROVIDER_SITE_OTHER): Payer: Medicare Other | Admitting: Nurse Practitioner

## 2022-06-10 ENCOUNTER — Encounter: Payer: Self-pay | Admitting: Nurse Practitioner

## 2022-06-10 ENCOUNTER — Ambulatory Visit (INDEPENDENT_AMBULATORY_CARE_PROVIDER_SITE_OTHER): Payer: Medicare Other

## 2022-06-10 VITALS — BP 102/68 | HR 75 | Ht 66.0 in | Wt 84.0 lb

## 2022-06-10 DIAGNOSIS — J939 Pneumothorax, unspecified: Secondary | ICD-10-CM

## 2022-06-10 DIAGNOSIS — I2699 Other pulmonary embolism without acute cor pulmonale: Secondary | ICD-10-CM

## 2022-06-10 DIAGNOSIS — J84112 Idiopathic pulmonary fibrosis: Secondary | ICD-10-CM | POA: Diagnosis not present

## 2022-06-10 DIAGNOSIS — R410 Disorientation, unspecified: Secondary | ICD-10-CM | POA: Diagnosis not present

## 2022-06-10 NOTE — Patient Instructions (Signed)
Continue Eliquis 5 mg Twice daily. Monitor for any bleeding or excessive bruising   Continue working with physical therapy at rehab. Try to stay up during the day and sleep at night. Staying in a well-light, sunny room is also good to do during the day  Plan for repeat echocardiogram in 3 months   Follow up in 3 months after echo with Dr. Vaughan Browner. If symptoms do not improve or worsen, please contact office for sooner follow up or seek emergency care.

## 2022-06-10 NOTE — Assessment & Plan Note (Signed)
IPF. Not interested in antifibrotic therapy. Continued surveillance.

## 2022-06-10 NOTE — Progress Notes (Signed)
$'@Patient'I$  ID: Chanda Busing, female    DOB: May 01, 1932, 89 y.o.   MRN: [id number]  Chief Complaint  Patient presents with   Follow-up    Pt HFU for pulmonary embolism, she reports she isn't feeling well. She has days when her breathing is ok and others where its worse. Report some SOB on exertion. Denies fevers, cough.    Referring provider: Tonia Ghent, MD  HPI: 89 year old female, never smoker followed for IPF, chronic cough and allergies.  She is a patient Dr. Matilde Bash and last seen in office 05/11/2022.  Past medical history significant for CHF, hypothyroidism, HLD.    She had a fall in October 2023 and suffered a left-sided small apical pneumothorax.  She was seen in office in December 2023 with worsening dyspnea.  Her daughter had reported that she had increased her thyroid medication after being off of it for few months.  Workup revealed positive D-dimer.  She underwent stat CTA which revealed acute subsegmental and segmental right lower lobe pulmonary emboli with evidence of right heart strain.  Echocardiogram showed elevated pulmonary artery pressures and normal RV function.  She was also found to have persistent left-sided 10% pneumothorax which was slightly increased, treated conservatively. She was admitted to the hospital from 05/11/2022-05/20/2022. During her stay, she was also found to have bilateral femoral vein DVTs. She was treated with heparin drip and transitioned to Eliquis upon discharge.   Pets: Cats.  She had a dog in the past.  No birds Occupation: Retired Surveyor, minerals Exposures: No known exposures.  No mold, hot tub, Jacuzzi.  No down pillows or comforter. Smoking history: Never smoker Travel history: No significant travel history Relevant family history: No significant family history of lung disease  TEST/EVENTS:  12/24/2021 PFT: FVC 40, FEV1 52, ratio 89, TLC 67, DLCO 38.  No BD 05/11/2022 CTA chest: Segmental and subsegmental  pulmonary emboli within the right lower lobe.  No LAD.  Small left pneumothorax (10%), mildly increased from prior.  Fibrotic changes are seen peripherally throughout both lungs, similar to prior.  Mild compression fracture at T9, appears subacute or acute. 05/11/2022 CXR 2 view: Small lateral left pneumothorax, probably unchanged compared to prior.  Chronic increased interstitial markings/fibrosis.   05/13/2022 echo: EF 50 to 55%.  Indeterminate diastolic parameters.  RV size and function is normal.  There is mildly elevated PASP.  Mild to moderate mitral valve regurgitation.  Mild to moderate TR.  Mild AR.  06/10/2022: Today-follow-up Patient presents today for hospital follow-up.  She was discharged on 12/14 after being admitted for acute pulmonary emboli.  She was transition to Eliquis upon discharge.  She has been compliant with this.  No excessive bruising or issues with bleeding.  Breathing feels relatively similar to when she was discharged.  She still feels little more short winded compared to her baseline.  Feels like her strength has not entirely returned.  She is working with physical therapy.  Her daughter also tells me that she seems to be a little more confused than she normally is.  This has been ongoing since she was admitted.  Tends to get her time of days mixed up.  Easily reoriented.  Denies any increased cough, fevers, chills, hemoptysis, headaches.  No Known Allergies  Immunization History  Administered Date(s) Administered   Fluad Quad(high Dose 65+) 02/22/2019, 07/06/2021   Influenza Split 03/13/2012   Influenza Whole 03/17/2007, 02/26/2013   Influenza, High Dose Seasonal PF 02/05/2014, 03/03/2016, 03/15/2017, 02/14/2018  Influenza,inj,Quad PF,6+ Mos 02/20/2015   Influenza-Unspecified 02/05/2018   Pneumococcal Conjugate-13 05/21/2014   Pneumococcal Polysaccharide-23 02/06/2007   Td 06/07/1998, 12/26/2008, 02/22/2019   Zoster, Live 02/06/2007    Past Medical History:   Diagnosis Date   Arthritis    High cholesterol    Hypothyroidism    IBS (irritable bowel syndrome)    Menopause    Pulmonary fibrosis (HCC)    Thyroid disease    Tubular adenoma 01/13/2007    Tobacco History: Social History   Tobacco Use  Smoking Status Never  Smokeless Tobacco Never   Counseling given: Not Answered   Outpatient Medications Prior to Visit  Medication Sig Dispense Refill   apixaban (ELIQUIS) 5 MG TABS tablet Take 1 tablet (5 mg total) by mouth 2 (two) times daily. 60 tablet 3   atorvastatin (LIPITOR) 10 MG tablet TAKE 1 TABLET EACH MORNING (Patient taking differently: Take 10 mg by mouth in the morning.) 90 tablet 1   Cholecalciferol (VITAMIN D) 50 MCG (2000 UT) CAPS Take 1 capsule (2,000 Units total) by mouth daily. 90 capsule 1   levothyroxine (SYNTHROID) 50 MCG tablet TAKE 50 MCG BY MOUTH DAILY ON EMPTY STOMACH. (Patient taking differently: Take 50 mcg by mouth daily before breakfast. TAKE 50 MCG BY MOUTH DAILY ON EMPTY STOMACH.) 90 tablet 1   Multiple Vitamin (MULTIVITAMIN WITH MINERALS) TABS tablet Take 1 tablet by mouth daily. 30 tablet 0   sodium chloride 1 g tablet Take 1 tablet (1 g total) by mouth daily. 90 tablet 1   apixaban (ELIQUIS) 5 MG TABS tablet Take 2 tablets (10 mg total) by mouth 2 (two) times daily for 2 days. 8 tablet 0   No facility-administered medications prior to visit.     Review of Systems:   Constitutional: No weight loss or gain, night sweats, fevers, chills. +fatigue, lassitude HEENT: No headaches, difficulty swallowing, tooth/dental problems, or sore throat. No sneezing, itching, ear ache, nasal congestion, or post nasal drip CV:  No chest pain, orthopnea, PND, swelling in lower extremities, anasarca, dizziness, palpitations, syncope Resp: +shortness of breath with exertion. No excess mucus or change in color of mucus. No productive or non-productive. No hemoptysis. No wheezing.  No chest wall deformity GI:  No heartburn,  indigestion, abdominal pain, nausea, vomiting, loss of appetite GU: No dysuria, change in color of urine, urgency or frequency.   Skin: No rash, lesions, ulcerations MSK:  No joint pain or swelling.   Neuro: No dizziness or lightheadedness. +confusion Psych: No depression or anxiety. Mood stable.     Physical Exam:  BP 102/68   Pulse 75   Ht '5\' 6"'$  (1.676 m)   Wt 84 lb (38.1 kg)   SpO2 93%   BMI 13.56 kg/m   GEN: Pleasant, interactive, chronically-ill appearing; in no acute distress HEENT:  Normocephalic and atraumatic. PERRLA. Sclera white. Nasal turbinates pink, moist and patent bilaterally. No rhinorrhea present. Oropharynx pink and moist, without exudate or edema. No lesions, ulcerations, or postnasal drip.  NECK:  Supple w/ fair ROM. No JVD present. Normal carotid impulses w/o bruits. Thyroid symmetrical with no goiter or nodules palpated. No lymphadenopathy.   CV: RRR, no m/r/g, no peripheral edema. Pulses intact, +2 bilaterally. No cyanosis, pallor or clubbing. PULMONARY:  Unlabored, regular breathing. Clear bilaterally A&P w/o wheezes/rales/rhonchi. No accessory muscle use.  GI: BS present and normoactive. Soft, non-tender to palpation. No organomegaly or masses detected.  MSK: No erythema, warmth or tenderness. Cap refil <2 sec all extrem. No  deformities or joint swelling noted.  Neuro: A/Ox3. No focal deficits noted.   Skin: Warm, no lesions or rashe Psych: Normal affect and behavior. Judgement and thought content appropriate.     Lab Results:  CBC    Component Value Date/Time   WBC 8.6 05/19/2022 0302   RBC 3.03 (L) 05/19/2022 0302   HGB 9.9 (L) 05/19/2022 0302   HGB 12.4 03/27/2015 1015   HCT 28.4 (L) 05/19/2022 0302   HCT 36.6 03/27/2015 1015   PLT 242 05/19/2022 0302   PLT 234 03/27/2015 1015   MCV 93.7 05/19/2022 0302   MCV 93 03/27/2015 1015   MCH 32.7 05/19/2022 0302   MCHC 34.9 05/19/2022 0302   RDW 13.3 05/19/2022 0302   RDW 13.3 03/27/2015 1015    LYMPHSABS 1.0 05/12/2022 2050   LYMPHSABS 1.6 03/27/2015 1015   MONOABS 1.0 05/12/2022 2050   EOSABS 0.2 05/12/2022 2050   EOSABS 0.2 03/27/2015 1015   BASOSABS 0.1 05/12/2022 2050   BASOSABS 0.0 03/27/2015 1015    BMET    Component Value Date/Time   NA 129 (L) 05/20/2022 0243   NA 141 03/27/2015 1015   K 4.2 05/20/2022 0243   CL 97 (L) 05/20/2022 0243   CO2 24 05/20/2022 0243   GLUCOSE 93 05/20/2022 0243   BUN 17 05/20/2022 0243   BUN 19 03/27/2015 1015   CREATININE 0.56 05/20/2022 0243   CREATININE 0.84 11/20/2018 0813   CALCIUM 8.8 (L) 05/20/2022 0243   GFRNONAA >60 05/20/2022 0243   GFRNONAA 63 11/20/2018 0813   GFRAA >60 02/18/2020 0320   GFRAA 73 11/20/2018 0813    BNP    Component Value Date/Time   BNP 81.3 12/14/2019 1114     Imaging:  ECHOCARDIOGRAM COMPLETE  Result Date: 05/13/2022    ECHOCARDIOGRAM REPORT   Patient Name:   [facility] Date of Exam: 05/13/2022 Medical Rec #:  [id number]          Height:       67.0 in Accession #:    [id number]         Weight:       84.0 lb Date of Birth:  27-Feb-1932          BSA:          1.399 m Patient Age:    89 years           BP:           138/74 mmHg Patient Gender: F                  HR:           64 bpm. Exam Location:  Inpatient Procedure: 2D Echo Indications:    pulmonary embolus  History:        Patient has prior history of Echocardiogram examinations, most                 recent 02/18/2020. CHF; Risk Factors:Dyslipidemia.  Sonographer:    Johny Chess RDCS Referring Phys: [place]  Sonographer Comments: Suboptimal subcostal window. IMPRESSIONS  1. Left ventricular ejection fraction, by estimation, is 50 to 55%. The left ventricle has low normal function. The left ventricle has no regional wall motion abnormalities. Left ventricular diastolic parameters are indeterminate.  2. Right ventricular systolic function is normal. The right ventricular size is normal. There is mildly elevated pulmonary  artery systolic pressure.  3. The mitral valve is normal in structure. Mild to moderate mitral valve  regurgitation. No evidence of mitral stenosis.  4. Tricuspid valve regurgitation is mild to moderate.  5. The aortic valve is normal in structure. Aortic valve regurgitation is mild. No aortic stenosis is present.  6. The inferior vena cava is normal in size with greater than 50% respiratory variability, suggesting right atrial pressure of 3 mmHg. FINDINGS  Left Ventricle: Left ventricular ejection fraction, by estimation, is 50 to 55%. The left ventricle has low normal function. The left ventricle has no regional wall motion abnormalities. The left ventricular internal cavity size was normal in size. There is no left ventricular hypertrophy. Left ventricular diastolic parameters are indeterminate. Right Ventricle: The right ventricular size is normal. No increase in right ventricular wall thickness. Right ventricular systolic function is normal. There is mildly elevated pulmonary artery systolic pressure. The tricuspid regurgitant velocity is 2.90  m/s, and with an assumed right atrial pressure of 3 mmHg, the estimated right ventricular systolic pressure is 44.0 mmHg. Left Atrium: Left atrial size was normal in size. Right Atrium: Right atrial size was normal in size. Pericardium: There is no evidence of pericardial effusion. Mitral Valve: The mitral valve is normal in structure. Mild to moderate mitral valve regurgitation. No evidence of mitral valve stenosis. Tricuspid Valve: The tricuspid valve is normal in structure. Tricuspid valve regurgitation is mild to moderate. No evidence of tricuspid stenosis. Aortic Valve: The aortic valve is normal in structure. Aortic valve regurgitation is mild. No aortic stenosis is present. Pulmonic Valve: The pulmonic valve was normal in structure. Pulmonic valve regurgitation is mild to moderate. No evidence of pulmonic stenosis. Aorta: The aortic root is normal in size and  structure. Venous: The inferior vena cava is normal in size with greater than 50% respiratory variability, suggesting right atrial pressure of 3 mmHg. IAS/Shunts: No atrial level shunt detected by color flow Doppler.  LEFT VENTRICLE PLAX 2D LVIDd:         3.80 cm     Diastology LVIDs:         2.50 cm     LV e' medial:    6.53 cm/s LV PW:         0.60 cm     LV E/e' medial:  12.6 LV IVS:        0.60 cm     LV e' lateral:   7.51 cm/s LVOT diam:     1.70 cm     LV E/e' lateral: 10.9 LV SV:         39 LV SV Index:   28 LVOT Area:     2.27 cm  LV Volumes (MOD) LV vol d, MOD A4C: 41.4 ml LV vol s, MOD A4C: 21.1 ml LV SV MOD A4C:     41.4 ml RIGHT VENTRICLE RV Basal diam:  2.60 cm RV S prime:     12.60 cm/s TAPSE (M-mode): 2.0 cm LEFT ATRIUM             Index        RIGHT ATRIUM          Index LA diam:        2.80 cm 2.00 cm/m   RA Area:     8.11 cm LA Vol (A2C):   25.1 ml 17.95 ml/m  RA Volume:   16.20 ml 11.58 ml/m LA Vol (A4C):   20.2 ml 14.44 ml/m LA Biplane Vol: 22.7 ml 16.23 ml/m  AORTIC VALVE LVOT Vmax:   82.90 cm/s LVOT Vmean:  51.700 cm/s LVOT VTI:  0.172 m  AORTA Ao Root diam: 2.50 cm Ao Asc diam:  2.60 cm MITRAL VALVE                TRICUSPID VALVE MV Area (PHT): 3.60 cm     TR Peak grad:   33.6 mmHg MV Decel Time: 211 msec     TR Vmax:        290.00 cm/s MV E velocity: 82.10 cm/s MV A velocity: 100.00 cm/s  SHUNTS MV E/A ratio:  0.82         Systemic VTI:  0.17 m                             Systemic Diam: 1.70 cm Kardie Tobb DO Electronically signed by Berniece Salines DO Signature Date/Time: 05/13/2022/10:02:05 PM    Final    VAS [place] LOWER EXTREMITY VENOUS (DVT)  Result Date: 05/13/2022  Lower Venous DVT Study Patient Name:  [facility]  Date of Exam:   05/13/2022 Medical Rec #: [id number]           Accession #:    [id number] Date of Birth: 06-01-1932           Patient Gender: F Patient Age:   37 years Exam Location:  [facility] Procedure:      VAS [place] LOWER EXTREMITY VENOUS (DVT) Referring  Phys: Nyoka Lint DOUTOVA --------------------------------------------------------------------------------  Indications: Pulmonary embolism.  Limitations: Poor patient position & unable to follow commands. Comparison Study: No previous exams Performing Technologist: Jody Hill RVT, RDMS  Examination Guidelines: A complete evaluation includes B-mode imaging, spectral Doppler, color Doppler, and power Doppler as needed of all accessible portions of each vessel. Bilateral testing is considered an integral part of a complete examination. Limited examinations for reoccurring indications may be performed as noted. The reflux portion of the exam is performed with the patient in reverse Trendelenburg.  +---------+---------------+---------+-----------+----------+--------------+ RIGHT    CompressibilityPhasicitySpontaneityPropertiesThrombus Aging +---------+---------------+---------+-----------+----------+--------------+ CFV      Full           Yes      Yes                                 +---------+---------------+---------+-----------+----------+--------------+ SFJ      Full                                                        +---------+---------------+---------+-----------+----------+--------------+ FV Prox  Full           Yes      Yes                                 +---------+---------------+---------+-----------+----------+--------------+ FV Mid   Full           Yes      Yes                                 +---------+---------------+---------+-----------+----------+--------------+ FV DistalPartial        Yes      Yes  Acute          +---------+---------------+---------+-----------+----------+--------------+ PFV      Full                                                        +---------+---------------+---------+-----------+----------+--------------+ POP      Full           Yes      Yes                                  +---------+---------------+---------+-----------+----------+--------------+ PTV      Full                                                        +---------+---------------+---------+-----------+----------+--------------+ PERO     Full                                                        +---------+---------------+---------+-----------+----------+--------------+   +---------+---------------+---------+-----------+----------+--------------+ LEFT     CompressibilityPhasicitySpontaneityPropertiesThrombus Aging +---------+---------------+---------+-----------+----------+--------------+ CFV      Full           Yes      Yes                                 +---------+---------------+---------+-----------+----------+--------------+ SFJ      Full                                                        +---------+---------------+---------+-----------+----------+--------------+ FV Prox  Full           Yes      Yes                                 +---------+---------------+---------+-----------+----------+--------------+ FV Mid                                                not visualized +---------+---------------+---------+-----------+----------+--------------+ FV DistalPartial        No       No                   Acute          +---------+---------------+---------+-----------+----------+--------------+ PFV      Full                                                        +---------+---------------+---------+-----------+----------+--------------+  POP      Full           Yes      Yes                                 +---------+---------------+---------+-----------+----------+--------------+ PTV      Full                                                        +---------+---------------+---------+-----------+----------+--------------+ PERO     Full                                                         +---------+---------------+---------+-----------+----------+--------------+     Summary: BILATERAL: -No evidence of popliteal cyst, bilaterally. RIGHT: - Findings consistent with acute deep vein thrombosis involving the right femoral vein (distal).  LEFT: - Findings consistent with acute deep vein thrombosis involving the left femoral vein (distal).  *See table(s) above for measurements and observations. Electronically signed by Jamelle Haring on 05/13/2022 at 5:40:39 PM.    Final    CT Angio Chest W/Cm &/Or Wo Cm  Result Date: 05/11/2022 CLINICAL DATA:  Elevated D dimer and chest pain. EXAM: CT ANGIOGRAPHY CHEST WITH CONTRAST TECHNIQUE: Multidetector CT imaging of the chest was performed using the standard protocol during bolus administration of intravenous contrast. Multiplanar CT image reconstructions and MIPs were obtained to evaluate the vascular anatomy. RADIATION DOSE REDUCTION: This exam was performed according to the departmental dose-optimization program which includes automated exposure control, adjustment of the mA and/or kV according to patient size and/or use of iterative reconstruction technique. CONTRAST:  60m OMNIPAQUE IOHEXOL 350 MG/ML SOLN COMPARISON:  CT chest abdomen and pelvis 03/23/2022 FINDINGS: Cardiovascular: There is adequate opacification of pulmonary arteries. There are segmental and subsegmental pulmonary emboli within the right lower lobe. Aorta is normal in size. There is a no pericardial effusion. Heart is normal in size. There are atherosclerotic calcifications of the aorta. Mediastinum/Nodes: No enlarged mediastinal, hilar, or axillary lymph nodes. Thyroid gland, trachea, and esophagus demonstrate no significant findings. Lungs/Pleura: There is a small left pneumothorax (10%) which has mildly increased from prior. Fibrotic changes are seen peripherally throughout both lungs similar to prior examination. No focal pulmonary infarct or lung consolidation. No pleural effusion or  pneumothorax. Upper Abdomen: No acute abnormality. 15 mm left renal cysts present. Musculoskeletal: There is new mild compression fracture of T9 which appears acute or subacute. No retropulsion of fracture fragments. There are healed right-sided rib fractures. Review of the MIP images confirms the above findings. IMPRESSION: 1. Acute segmental and subsegmental pulmonary emboli in the right lower lobe. No evidence for right heart strain. Positive for acute PE with CT evidence of right heart strain (RV/LV Ratio = 1.2) consistent with at least submassive (intermediate risk) PE. The presence of right heart strain has been associated with an increased risk of morbidity and mortality. 2. Small left pneumothorax (10%), mildly increased from prior. 3. New acute or subacute T9 compression fracture. Aortic Atherosclerosis (ICD10-I70.0). These results were called by telephone at the time of interpretation on 05/11/2022 at 7:00 pm  to provider [facility] , who verbally acknowledged these results. Electronically Signed   By: Ronney Asters M.D.   On: 05/11/2022 19:01         Latest Ref Rng & Units 01/01/2022    2:54 PM  PFT Results  FVC-Pre L 1.02   FVC-Predicted Pre % 40   FVC-Post L 0.99   FVC-Predicted Post % 39   Pre FEV1/FVC % % 95   Post FEV1/FCV % % 89   FEV1-Pre L 0.97   FEV1-Predicted Pre % 52   FEV1-Post L 0.88   DLCO uncorrected ml/min/mmHg 7.50   DLCO UNC% % 38   DLCO corrected ml/min/mmHg 7.50   DLCO COR %Predicted % 38   DLVA Predicted % 84   TLC L 3.64   TLC % Predicted % 67   RV % Predicted % 99     No results found for: "NITRICOXIDE"      Assessment & Plan:   Acute pulmonary embolism ([place]) Hospitalized 05/11/2022-05/20/2022 for acute pulmonary emboli. Unprovoked. Multiple risk factors so she will likely need lifelong anticoagulation but at minimum, 6 months of therapy. Plan to repeat echo in 3 months. Compliant with Eliquis therapy. Reviewed red flag symptoms with  anticoagulation. Verbalized understanding. Encouraged her to continue working with PT at rehab.  Patient Instructions  Continue Eliquis 5 mg Twice daily. Monitor for any bleeding or excessive bruising   Continue working with physical therapy at rehab. Try to stay up during the day and sleep at night. Staying in a well-light, sunny room is also good to do during the day  Plan for repeat echocardiogram in 3 months   Follow up in 3 months after echo with Dr. Vaughan Browner. If symptoms do not improve or worsen, please contact office for sooner follow up or seek emergency care.    IPF (idiopathic pulmonary fibrosis) (HCC) IPF. Not interested in antifibrotic therapy. Continued surveillance.   Pneumothorax Small left sided ptx since fall 03/2022. Stable on recent CXR prior to discharge. CXR today for follow up.   Delirium Related to 9 day hospitalization and stay in rehab. Reviewed measures to improve delirium. She should continue to work with PT/OT at rehab. Up during the day. Minimal night time interruptions.    I spent 35 minutes of dedicated to the care of this patient on the date of this encounter to include pre-visit review of records, face-to-face time with the patient discussing conditions above, post visit ordering of testing, clinical documentation with the electronic health record, making appropriate referrals as documented, and communicating necessary findings to members of the patients care team.  Clayton Bibles, NP 06/10/2022  Pt aware and understands NP's role.

## 2022-06-10 NOTE — Assessment & Plan Note (Signed)
Related to 9 day hospitalization and stay in rehab. Reviewed measures to improve delirium. She should continue to work with PT/OT at rehab. Up during the day. Minimal night time interruptions.

## 2022-06-10 NOTE — Assessment & Plan Note (Signed)
Hospitalized 05/11/2022-05/20/2022 for acute pulmonary emboli. Unprovoked. Multiple risk factors so she will likely need lifelong anticoagulation but at minimum, 6 months of therapy. Plan to repeat echo in 3 months. Compliant with Eliquis therapy. Reviewed red flag symptoms with anticoagulation. Verbalized understanding. Encouraged her to continue working with PT at rehab.  Patient Instructions  Continue Eliquis 5 mg Twice daily. Monitor for any bleeding or excessive bruising   Continue working with physical therapy at rehab. Try to stay up during the day and sleep at night. Staying in a well-light, sunny room is also good to do during the day  Plan for repeat echocardiogram in 3 months   Follow up in 3 months after echo with Dr. Vaughan Browner. If symptoms do not improve or worsen, please contact office for sooner follow up or seek emergency care.

## 2022-06-10 NOTE — Assessment & Plan Note (Signed)
Small left sided ptx since fall 03/2022. Stable on recent CXR prior to discharge. CXR today for follow up.

## 2022-06-14 NOTE — Progress Notes (Signed)
Please notify patient that her previous pneumothorax has resolved, which is good news. Chest x ray appears stable. The radiologist did mention that they were unable to exclude acute infection, but upon my review, it looks very similar to her previous imaging. Since she's not having any infectious symptoms, ok to follow up as previously scheduled. Thanks.

## 2022-06-17 ENCOUNTER — Other Ambulatory Visit: Payer: Self-pay | Admitting: *Deleted

## 2022-06-17 NOTE — Patient Outreach (Signed)
Late entry for 06/16/22 [facility] Coordinator follow up. Screening for potential [facility] care coordination services as benefit of insurance plan and Primary Care Coordinator.  Facility site visit to [facility]. Met with Press photographer at [facility]. Marita [place] reports family appealed discharge. Transition plan is possibly [facility]. [facility] to come to [facility] to assess Mrs. Kaman for admission on 06/17/22. Mrs. Wentzel previously from home alone.   Will continue to follow and plan outreach to daughter if appropriate.   Marthenia Rolling, MSN, RN,BSN [facility] Acute Care Coordinator [phone] (Direct dial)

## 2022-06-21 ENCOUNTER — Telehealth: Payer: Self-pay | Admitting: Family Medicine

## 2022-06-21 NOTE — Telephone Encounter (Signed)
Patients daughter came by and dropped off adult care home fl2 form to be completed by provider. She's moving into an assistant living and they are needing this form back by tomorrow 06/22/22 afternoon in order for there to be no delays in her being admitted. Her husband Ulice Brilliant will come to pick it up once completed. Number to call, once completed [phone]

## 2022-06-22 ENCOUNTER — Other Ambulatory Visit: Payer: Self-pay | Admitting: *Deleted

## 2022-06-22 NOTE — Telephone Encounter (Signed)
Done. Thanks.

## 2022-06-22 NOTE — Patient Outreach (Signed)
Verified in [facility] Mrs. Powell discharged from [facility] on 06/18/22.   Confirmed with [facility] facility social worker, Mrs. Fretz transitioned to [facility].   No identifiable [facility] care coordination needs.   Marthenia Rolling, MSN, RN,BSN [facility] Acute Care Coordinator [phone] (Direct dial)

## 2022-06-22 NOTE — Telephone Encounter (Signed)
Pt's son-n-law called asking for status of the letter his wife, [name], dropped off for pt on yesterday? Call back # [phone]

## 2022-06-22 NOTE — Telephone Encounter (Signed)
Patients daughter notified form was done. I have made a copy for scan and placed other up front for pickup.

## 2022-07-01 DIAGNOSIS — E785 Hyperlipidemia, unspecified: Secondary | ICD-10-CM | POA: Diagnosis not present

## 2022-07-01 DIAGNOSIS — F0390 Unspecified dementia without behavioral disturbance: Secondary | ICD-10-CM

## 2022-07-01 DIAGNOSIS — M545 Low back pain, unspecified: Secondary | ICD-10-CM

## 2022-07-01 DIAGNOSIS — E039 Hypothyroidism, unspecified: Secondary | ICD-10-CM

## 2022-07-01 DIAGNOSIS — M1990 Unspecified osteoarthritis, unspecified site: Secondary | ICD-10-CM | POA: Diagnosis not present

## 2022-07-01 DIAGNOSIS — D649 Anemia, unspecified: Secondary | ICD-10-CM

## 2022-07-01 DIAGNOSIS — J84112 Idiopathic pulmonary fibrosis: Secondary | ICD-10-CM | POA: Diagnosis not present

## 2022-07-01 DIAGNOSIS — I5032 Chronic diastolic (congestive) heart failure: Secondary | ICD-10-CM | POA: Diagnosis not present

## 2022-07-07 DIAGNOSIS — I509 Heart failure, unspecified: Secondary | ICD-10-CM | POA: Diagnosis not present

## 2022-07-07 DIAGNOSIS — J8410 Pulmonary fibrosis, unspecified: Secondary | ICD-10-CM | POA: Diagnosis not present

## 2022-07-07 DIAGNOSIS — R5381 Other malaise: Secondary | ICD-10-CM | POA: Diagnosis not present

## 2022-07-07 DIAGNOSIS — F0390 Unspecified dementia without behavioral disturbance: Secondary | ICD-10-CM | POA: Diagnosis not present

## 2022-07-09 DIAGNOSIS — Z9181 History of falling: Secondary | ICD-10-CM

## 2022-07-09 DIAGNOSIS — S51011A Laceration without foreign body of right elbow, initial encounter: Secondary | ICD-10-CM | POA: Diagnosis not present

## 2022-07-09 DIAGNOSIS — W19XXXA Unspecified fall, initial encounter: Secondary | ICD-10-CM

## 2022-07-09 DIAGNOSIS — M6281 Muscle weakness (generalized): Secondary | ICD-10-CM | POA: Diagnosis not present

## 2022-07-09 DIAGNOSIS — R2689 Other abnormalities of gait and mobility: Secondary | ICD-10-CM | POA: Diagnosis not present

## 2022-07-09 DIAGNOSIS — F0390 Unspecified dementia without behavioral disturbance: Secondary | ICD-10-CM

## 2022-07-09 DIAGNOSIS — R269 Unspecified abnormalities of gait and mobility: Secondary | ICD-10-CM | POA: Diagnosis not present

## 2022-07-23 DIAGNOSIS — E871 Hypo-osmolality and hyponatremia: Secondary | ICD-10-CM

## 2022-07-23 DIAGNOSIS — R4182 Altered mental status, unspecified: Secondary | ICD-10-CM

## 2022-07-23 DIAGNOSIS — R531 Weakness: Secondary | ICD-10-CM

## 2022-07-23 DIAGNOSIS — F0390 Unspecified dementia without behavioral disturbance: Secondary | ICD-10-CM

## 2022-07-23 DIAGNOSIS — R5381 Other malaise: Secondary | ICD-10-CM

## 2022-07-23 DIAGNOSIS — U071 COVID-19: Secondary | ICD-10-CM

## 2022-07-23 DIAGNOSIS — D649 Anemia, unspecified: Secondary | ICD-10-CM

## 2022-07-26 ENCOUNTER — Other Ambulatory Visit: Payer: Self-pay | Admitting: Family Medicine

## 2022-07-26 DIAGNOSIS — M810 Age-related osteoporosis without current pathological fracture: Secondary | ICD-10-CM

## 2022-08-04 DIAGNOSIS — W19XXXA Unspecified fall, initial encounter: Secondary | ICD-10-CM

## 2022-08-04 DIAGNOSIS — F0390 Unspecified dementia without behavioral disturbance: Secondary | ICD-10-CM

## 2022-08-04 DIAGNOSIS — R2689 Other abnormalities of gait and mobility: Secondary | ICD-10-CM

## 2022-08-04 DIAGNOSIS — Z9181 History of falling: Secondary | ICD-10-CM

## 2022-08-04 DIAGNOSIS — R269 Unspecified abnormalities of gait and mobility: Secondary | ICD-10-CM

## 2022-08-04 DIAGNOSIS — M6281 Muscle weakness (generalized): Secondary | ICD-10-CM

## 2022-08-09 DIAGNOSIS — Z9181 History of falling: Secondary | ICD-10-CM

## 2022-08-09 DIAGNOSIS — M8580 Other specified disorders of bone density and structure, unspecified site: Secondary | ICD-10-CM

## 2022-08-09 DIAGNOSIS — M479 Spondylosis, unspecified: Secondary | ICD-10-CM

## 2022-08-09 DIAGNOSIS — R2689 Other abnormalities of gait and mobility: Secondary | ICD-10-CM

## 2022-08-09 DIAGNOSIS — M4134 Thoracogenic scoliosis, thoracic region: Secondary | ICD-10-CM

## 2022-08-09 DIAGNOSIS — R269 Unspecified abnormalities of gait and mobility: Secondary | ICD-10-CM

## 2022-08-09 DIAGNOSIS — S22070D Wedge compression fracture of T9-T10 vertebra, subsequent encounter for fracture with routine healing: Secondary | ICD-10-CM

## 2022-08-09 DIAGNOSIS — W19XXXA Unspecified fall, initial encounter: Secondary | ICD-10-CM

## 2022-08-09 DIAGNOSIS — M545 Low back pain, unspecified: Secondary | ICD-10-CM

## 2022-08-09 DIAGNOSIS — M6281 Muscle weakness (generalized): Secondary | ICD-10-CM

## 2022-08-09 DIAGNOSIS — F0390 Unspecified dementia without behavioral disturbance: Secondary | ICD-10-CM

## 2022-08-15 DIAGNOSIS — R000 Tachycardia, unspecified: Secondary | ICD-10-CM

## 2022-08-17 DIAGNOSIS — I4510 Unspecified right bundle-branch block: Secondary | ICD-10-CM

## 2022-10-06 DEATH — deceased
